# Patient Record
Sex: Female | Born: 1960
Health system: Southern US, Community
[De-identification: ages and names within clinical notes are randomized; demographics above are authoritative.]

## PROBLEM LIST (undated history)

## (undated) DIAGNOSIS — E119 Type 2 diabetes mellitus without complications: Secondary | ICD-10-CM

## (undated) HISTORY — DX: Type 2 diabetes mellitus without complications: E11.9

---

## 1988-06-23 HISTORY — PX: TUBAL LIGATION: SHX77

## 2009-06-23 HISTORY — PX: BUNIONECTOMY: SHX129

## 2013-06-21 ENCOUNTER — Emergency Department (HOSPITAL_COMMUNITY)
Admission: EM | Admit: 2013-06-21 | Discharge: 2013-06-21 | Disposition: A | Discharge status: Home / Self Care | Payer: BC Managed Care – PPO | Source: Home / Self Care

## 2013-06-21 ENCOUNTER — Encounter (HOSPITAL_COMMUNITY): Payer: Self-pay | Admitting: Emergency Medicine

## 2013-06-21 DIAGNOSIS — H6981 Other specified disorders of Eustachian tube, right ear: Secondary | ICD-10-CM

## 2013-06-21 DIAGNOSIS — H6692 Otitis media, unspecified, left ear: Secondary | ICD-10-CM

## 2013-06-21 DIAGNOSIS — H6980 Other specified disorders of Eustachian tube, unspecified ear: Secondary | ICD-10-CM

## 2013-06-21 DIAGNOSIS — H6690 Otitis media, unspecified, unspecified ear: Secondary | ICD-10-CM

## 2013-06-21 DIAGNOSIS — J019 Acute sinusitis, unspecified: Secondary | ICD-10-CM

## 2013-06-21 MED ORDER — AMOXICILLIN 500 MG PO CAPS: 1000.0000 mg | ORAL_CAPSULE | Freq: Two times a day (BID) | ORAL | Status: DC

## 2013-06-21 NOTE — ED Provider Notes (Signed)
CSN: [id number]     Arrival date & time 06/21/13  0901 History   First MD Initiated Contact with Patient 06/21/13 1102     Chief Complaint  Patient presents with  . Ear Fullness   (Consider location/radiation/quality/duration/timing/severity/associated sxs/prior Treatment) HPI Comments: 52 year old female presents with a history of recent "cold" symptoms for about 3 days. She did have PND and a scratchy throat but these symptoms are improving. She is now complaining of bilateral itchiness of the ears and left ear fullness and stuffiness. Is also complaining of cream axillary fullness. She is not taking medications for these symptoms.  Patient is a 52 y.o. female presenting with plugged ear sensation.  Ear Fullness    History reviewed. No pertinent past medical history. Past Surgical History  Procedure Laterality Date  . Bunionectomy  2011  . Tubal ligation  1990   No family history on file. History  Substance Use Topics  . Smoking status: Current Every Day Smoker  . Smokeless tobacco: Not on file  . Alcohol Use: Yes   OB History   Grav Para Term Preterm Abortions TAB SAB Ect Mult Living                 Review of Systems  Constitutional: Negative for fever, chills, activity change, appetite change and fatigue.  HENT: Positive for congestion and sinus pressure. Negative for facial swelling, postnasal drip and rhinorrhea.   Eyes: Negative.   Respiratory: Negative.   Cardiovascular: Negative.   Musculoskeletal: Negative for neck pain and neck stiffness.  Skin: Negative for pallor and rash.  Neurological: Negative.     Allergies  Review of patient's allergies indicates no known allergies.  Home Medications   Current Outpatient Rx  Name  Route  Sig  Dispense  Refill  . amoxicillin (AMOXIL) 500 MG capsule   Oral   Take 2 capsules (1,000 mg total) by mouth 2 (two) times daily.   40 capsule   0    BP 129/85  Pulse 70  Temp(Src) 98.7 F (37.1 C) (Oral)  Resp 16   SpO2 100% Physical Exam  Nursing note and vitals reviewed. Constitutional: She is oriented to person, place, and time. She appears well-developed and well-nourished. No distress.  HENT:  Right TM is retracted. Left TM is erythematous with partial loss of landmarks. Oropharynx with minor erythema but no exudates.  Eyes: Conjunctivae and EOM are normal.  Neck: Normal range of motion. Neck supple.  Cardiovascular: Normal rate, regular rhythm and normal heart sounds.   Pulmonary/Chest: Effort normal and breath sounds normal. No respiratory distress. She has no wheezes. She has no rales.  Musculoskeletal: Normal range of motion. She exhibits no edema.  Lymphadenopathy:    She has no cervical adenopathy.  Neurological: She is alert and oriented to person, place, and time.  Skin: Skin is warm and dry. No rash noted.  Psychiatric: She has a normal mood and affect.    ED Course  Procedures (including critical care time) Labs Review Labs Reviewed - No data to display Imaging Review No results found.    MDM   1. Acute rhinosinusitis   2. Left otitis media   3. ETD (eustachian tube dysfunction), right     Amoxicillin 1 g twice a day for 10 days Phenylephrine 10 mg and guaifenesin 100 200 mg by mouth every 4 hours when necessary sinus congestion Saline nasal spray use frequently Drink plenty of fluids  Hayden Rasmussen, NP 06/21/13 1144

## 2013-06-21 NOTE — ED Notes (Signed)
Reports bilateral ear itchiness, left side of head and left ear fullness/stuffiness.  Onset Sunday night of symptoms

## 2013-06-21 NOTE — ED Provider Notes (Signed)
Medical screening examination/treatment/procedure(s) were performed by non-physician practitioner and as supervising physician I was immediately available for consultation/collaboration.  Leslee Home, M.D.  Reuben Likes, MD 06/21/13 [phone]

## 2015-08-22 DIAGNOSIS — E119 Type 2 diabetes mellitus without complications: Secondary | ICD-10-CM

## 2015-08-22 HISTORY — DX: Type 2 diabetes mellitus without complications: E11.9

## 2015-09-03 ENCOUNTER — Ambulatory Visit (INDEPENDENT_AMBULATORY_CARE_PROVIDER_SITE_OTHER): Payer: BLUE CROSS/BLUE SHIELD | Admitting: Emergency Medicine

## 2015-09-03 ENCOUNTER — Ambulatory Visit (INDEPENDENT_AMBULATORY_CARE_PROVIDER_SITE_OTHER): Payer: BLUE CROSS/BLUE SHIELD

## 2015-09-03 ENCOUNTER — Encounter (HOSPITAL_COMMUNITY): Payer: Self-pay | Admitting: Emergency Medicine

## 2015-09-03 ENCOUNTER — Emergency Department (HOSPITAL_COMMUNITY)
Admission: EM | Admit: 2015-09-03 | Discharge: 2015-09-03 | Disposition: A | Discharge status: Home / Self Care | Payer: BLUE CROSS/BLUE SHIELD | Attending: Emergency Medicine | Admitting: Emergency Medicine

## 2015-09-03 VITALS — BP 120/80 | HR 101 | Temp 97.5°F | Resp 18 | Ht 63.0 in | Wt 120.6 lb

## 2015-09-03 DIAGNOSIS — R634 Abnormal weight loss: Secondary | ICD-10-CM

## 2015-09-03 DIAGNOSIS — Z79899 Other long term (current) drug therapy: Secondary | ICD-10-CM | POA: Diagnosis not present

## 2015-09-03 DIAGNOSIS — E119 Type 2 diabetes mellitus without complications: Secondary | ICD-10-CM

## 2015-09-03 DIAGNOSIS — Z720 Tobacco use: Secondary | ICD-10-CM

## 2015-09-03 DIAGNOSIS — E063 Autoimmune thyroiditis: Secondary | ICD-10-CM

## 2015-09-03 DIAGNOSIS — R002 Palpitations: Secondary | ICD-10-CM

## 2015-09-03 DIAGNOSIS — R739 Hyperglycemia, unspecified: Secondary | ICD-10-CM | POA: Diagnosis present

## 2015-09-03 DIAGNOSIS — F17200 Nicotine dependence, unspecified, uncomplicated: Secondary | ICD-10-CM

## 2015-09-03 LAB — URINALYSIS, ROUTINE W REFLEX MICROSCOPIC
BILIRUBIN URINE: NEGATIVE
Glucose, UA: >1000 mg/dL — AB
Hgb urine dipstick: NEGATIVE
Ketones, ur: >80 mg/dL — AB
Leukocytes, UA: NEGATIVE
NITRITE: NEGATIVE
Protein, ur: 30 mg/dL — AB
Specific Gravity, Urine: 1.044 — ABNORMAL HIGH (ref 1.005–1.030)
pH: 5.5 (ref 5.0–8.0)

## 2015-09-03 LAB — CBG MONITORING, ED
GLUCOSE-CAPILLARY: 269 mg/dL — AB (ref 65–99)
Glucose-Capillary: 246 mg/dL — ABNORMAL HIGH (ref 65–99)

## 2015-09-03 LAB — I-STAT CHEM 8, ED
BUN: 4 mg/dL — AB (ref 6–20)
CHLORIDE: 104 mmol/L (ref 101–111)
Calcium, Ion: 1.12 mmol/L (ref 1.12–1.23)
Creatinine, Ser: 0.4 mg/dL — ABNORMAL LOW (ref 0.44–1.00)
Glucose, Bld: 255 mg/dL — ABNORMAL HIGH (ref 65–99)
HEMATOCRIT: 37 % (ref 36.0–46.0)
Hemoglobin: 12.6 g/dL (ref 12.0–15.0)
Potassium: 3.7 mmol/L (ref 3.5–5.1)
SODIUM: 138 mmol/L (ref 135–145)
TCO2: 18 mmol/L (ref 0–100)

## 2015-09-03 LAB — BASIC METABOLIC PANEL
ANION GAP: 14 (ref 5–15)
BUN: 6 mg/dL (ref 6–20)
CALCIUM: 8.6 mg/dL — AB (ref 8.9–10.3)
CO2: 15 mmol/L — AB (ref 22–32)
Chloride: 103 mmol/L (ref 101–111)
Creatinine, Ser: 0.61 mg/dL (ref 0.44–1.00)
Glucose, Bld: 290 mg/dL — ABNORMAL HIGH (ref 65–99)
Potassium: 3.2 mmol/L — ABNORMAL LOW (ref 3.5–5.1)
SODIUM: 132 mmol/L — AB (ref 135–145)

## 2015-09-03 LAB — URINE MICROSCOPIC-ADD ON

## 2015-09-03 LAB — CBC
HCT: 44.3 % (ref 36.0–46.0)
HEMOGLOBIN: 16 g/dL — AB (ref 12.0–15.0)
MCH: 31.7 pg (ref 26.0–34.0)
MCHC: 36.1 g/dL — ABNORMAL HIGH (ref 30.0–36.0)
MCV: 87.7 fL (ref 78.0–100.0)
Platelets: 212 10*3/uL (ref 150–400)
RBC: 5.05 MIL/uL (ref 3.87–5.11)
RDW: 13.1 % (ref 11.5–15.5)
WBC: 9 10*3/uL (ref 4.0–10.5)

## 2015-09-03 LAB — COMPLETE METABOLIC PANEL WITH GFR
ALT: 21 U/L (ref 6–29)
AST: 18 U/L (ref 10–35)
Albumin: 4.2 g/dL (ref 3.6–5.1)
Alkaline Phosphatase: 121 U/L (ref 33–130)
BILIRUBIN TOTAL: 0.5 mg/dL (ref 0.2–1.2)
BUN: 6 mg/dL — AB (ref 7–25)
CHLORIDE: 96 mmol/L — AB (ref 98–110)
CO2: 18 mmol/L — AB (ref 20–31)
CREATININE: 0.67 mg/dL (ref 0.50–1.05)
Calcium: 9.4 mg/dL (ref 8.6–10.4)
GFR, Est African American: >89 mL/min (ref >=60)
GFR, Est Non African American: >89 mL/min (ref >=60)
GLUCOSE: 330 mg/dL — AB (ref 65–99)
Potassium: 3.5 mmol/L (ref 3.5–5.3)
SODIUM: 134 mmol/L — AB (ref 135–146)
Total Protein: 6.4 g/dL (ref 6.1–8.1)

## 2015-09-03 LAB — POCT CBC
Granulocyte percent: 68.3 %G (ref 37–80)
HEMATOCRIT: 45.2 % (ref 37.7–47.9)
HEMOGLOBIN: 16.2 g/dL (ref 12.2–16.2)
Lymph, poc: 2.3 (ref 0.6–3.4)
MCH, POC: 32.2 pg — AB (ref 27–31.2)
MCHC: 35.9 g/dL — AB (ref 31.8–35.4)
MCV: 89.7 fL (ref 80–97)
MID (cbc): 0.5 (ref 0–0.9)
MPV: 9 fL (ref 0–99.8)
POC GRANULOCYTE: 6 (ref 2–6.9)
POC LYMPH PERCENT: 26.2 %L (ref 10–50)
POC MID %: 5.5 %M (ref 0–12)
Platelet Count, POC: 215 10*3/uL (ref 142–424)
RBC: 5.04 M/uL (ref 4.04–5.48)
RDW, POC: 13 %
WBC: 8.8 10*3/uL (ref 4.6–10.2)

## 2015-09-03 LAB — THYROID PANEL WITH TSH
Free Thyroxine Index: 1.5 (ref 1.4–3.8)
T3 Uptake: 34 % (ref 22–35)
T4, Total: 4.3 ug/dL — ABNORMAL LOW (ref 4.5–12.0)
TSH: 4.88 mIU/L — ABNORMAL HIGH

## 2015-09-03 LAB — GLUCOSE, POCT (MANUAL RESULT ENTRY): POC GLUCOSE: 305 mg/dL — AB (ref 70–99)

## 2015-09-03 LAB — POCT GLYCOSYLATED HEMOGLOBIN (HGB A1C)

## 2015-09-03 MED ORDER — METFORMIN HCL 500 MG PO TABS: 500.0000 mg | ORAL_TABLET | Freq: Two times a day (BID) | ORAL | Status: DC

## 2015-09-03 MED ORDER — SODIUM CHLORIDE 0.9 % IV BOLUS (SEPSIS)
1000.0000 mL | Freq: Once | INTRAVENOUS | Status: AC
  Administered 2015-09-03: 1000 mL via INTRAVENOUS

## 2015-09-03 MED ORDER — POTASSIUM CHLORIDE CRYS ER 20 MEQ PO TBCR
40.0000 meq | EXTENDED_RELEASE_TABLET | Freq: Once | ORAL | Status: AC
  Administered 2015-09-03: 40 meq via ORAL
  Filled 2015-09-03: qty 2

## 2015-09-03 MED ORDER — METFORMIN HCL 500 MG PO TABS
500.0000 mg | ORAL_TABLET | Freq: Once | ORAL | Status: AC
  Administered 2015-09-03: 500 mg via ORAL
  Filled 2015-09-03: qty 1

## 2015-09-03 NOTE — Progress Notes (Addendum)
Subjective:  This chart was scribed for Arlyss Queen MD,  by Tamsen Roers, at [facility].  This patient was seen in room  7 and the patient's care was started at 11:14 AM.    Patient ID: Felicia Gardner, female    DOB: 16-May-1961, 55 y.o.   MRN: [id number]  HPI HPI Comments: Felicia Gardner is a 55 y.o. female who presents to the [facility] complaining of shortness of breath, heart palpitations, fatigue and weight loss.   In January, patient decided she wanted to lose weight and started doing portion control and states that her weight came off very fast fast (from 142 to 120- which was concerning for her).  Patient then went on Chantix to quit smoking (1 mg in the morning to 1 at night) and remembers urinating in her sleep in bed afterwards.  She stopped taking the Chantix but still feels like she is under the effect of it. Patient smokes daily (1/2 per day).  She is not currently on any medications and drinks coffee (1-3 cups daily).    She is also complaining of a dry spot in the back of her throat and states that she has to drink close to 18 bottles of water a day to get rid of it.    Patient states that thyroid disease is very prevalent in her family (sister, mother, grandmother).   Patient has an appointment with Dr. Brigitte Pulse in April.    Patients last check up was 6 years ago.  She is not up to date with her screenings.    There are no active problems to display for this patient.  History reviewed. No pertinent past medical history. Past Surgical History  Procedure Laterality Date  . Bunionectomy  2011  . Tubal ligation  1990   No Known Allergies Prior to Admission medications   Not on File   Social History   Social History  . Marital Status: Married    Spouse Name: N/A  . Number of Children: N/A  . Years of Education: N/A   Occupational History  . Not on file.   Social History Main Topics  . Smoking status: Current Every  Day Smoker  . Smokeless tobacco: Not on file  . Alcohol Use: Yes  . Drug Use: No  . Sexual Activity: Not on file   Other Topics Concern  . Not on file   Social History Narrative       Review of Systems  Constitutional: Positive for fatigue and unexpected weight change.  Eyes: Negative for pain, redness and itching.  Respiratory: Positive for shortness of breath. Negative for choking.   Cardiovascular: Positive for palpitations.  Gastrointestinal: Negative for nausea and vomiting.  Musculoskeletal: Negative for neck pain and neck stiffness.  Skin: Negative for color change.  Neurological: Negative for seizures, syncope and speech difficulty.       Objective:   Physical Exam Filed Vitals:   09/03/15 1054  BP: 120/80  Pulse: 101  Temp: 97.5 F (36.4 C)  Resp: 18  Height: 5\' 3"  (1.6 m)  Weight: 120 lb 9.6 oz (54.704 kg)  SpO2: 98%    CONSTITUTIONAL: She is a thin female who does not appear in any distress HEAD: Normocephalic/atraumatic EYES: EOMI/PERRL ENMT: Mucous membranes moist NECK: thyroid is not enlarged.  SPINE/BACK:entire spine nontender CV: S1/S2 noted, no murmurs/rubs/gallops noted LUNGS: Lungs are clear to auscultation bilaterally, no apparent distress ABDOMEN: soft, nontender, no rebound or guarding, bowel  sounds noted throughout abdomen GU:no cva tenderness NEURO: Pt is awake/alert/appropriate, moves all extremitiesx4.  No facial droop.   EXTREMITIES: pulses normal/equal, full ROM SKIN: warm, color normal PSYCH: no abnormalities of mood noted, alert and oriented to situation Breast exam: normal.  EKG NSR Results for orders placed or performed in visit on 09/03/15  POCT CBC  Result Value Ref Range   WBC 8.8 4.6 - 10.2 K/uL   Lymph, poc 2.3 0.6 - 3.4   POC LYMPH PERCENT 26.2 10 - 50 %L   MID (cbc) 0.5 0 - 0.9   POC MID % 5.5 0 - 12 %M   POC Granulocyte 6.0 2 - 6.9   Granulocyte percent 68.3 37 - 80 %G   RBC 5.04 4.04 - 5.48 M/uL   Hemoglobin  16.2 12.2 - 16.2 g/dL   HCT, POC 45.2 37.7 - 47.9 %   MCV 89.7 80 - 97 fL   MCH, POC 32.2 (A) 27 - 31.2 pg   MCHC 35.9 (A) 31.8 - 35.4 g/dL   RDW, POC 13.0 %   Platelet Count, POC 215 142 - 424 K/uL   MPV 9.0 0 - 99.8 fL  POCT glucose (manual entry)  Result Value Ref Range   POC Glucose 305 (A) 70 - 99 mg/dl   Dg Chest 2 View  09/03/2015  CLINICAL DATA:  Shortness of breath. Heart palpitations. Fatigue and weight loss. EXAM: CHEST  2 VIEW COMPARISON:  None. FINDINGS: Normal cardiac silhouette and mediastinal contours. No focal parenchymal opacities. No pleural effusion or pneumothorax. No evidence of edema. No acute osseus abnormalities. IMPRESSION: No acute cardiopulmonary disease. Electronically Signed   By: Sandi Mariscal M.D.   On: 09/03/2015 12:18      Assessment & Plan:  Patient presents with a 20 pound weight loss since the first of the year. It appears patient is a new onset diabetic with a glucose of 305. IV will be started and she will be transported to the emergency room for further evaluation IV fluids and evaluation of renal function. I did place a referral to endocrinology. I personally performed the services described in this documentation, which was scribed in my presence. The recorded information has been reviewed and is accurate. Darlyne Russian, MD On 09/05/2015 I received results of her C-peptide and thyroid studies. It appears she has an autoimmune thyroiditis with positive antibodies as well as very limited insulin production. This is suspicious for an autoimmune problem involving the pancreas and thyroid. Urgent referral made to endocrinology. I called the patient and she is aware.

## 2015-09-03 NOTE — ED Provider Notes (Signed)
CSN: [id number]     Arrival date & time 09/03/15  1311 History   First MD Initiated Contact with Patient 09/03/15 1816     Chief Complaint  Patient presents with  . Hyperglycemia     (Consider location/radiation/quality/duration/timing/severity/associated sxs/prior Treatment) HPI Natonya Kosmalski is a 55 y.o. female with no major medical problems, presents to emergency department complaining of polyuria, polydipsia, elevated blood sugar at urgent care today. Patient states she went to urgent care because she has had generalized weakness, she states she slept all weekend and had no energy to do anything. She reports polyuria and polydipsia for several months. She reports that she recently to Chantix, states stopped it 2 weeks ago, she reports that she looked up side effects of Chantix and states that it can cause diabetes. Patient however states that she does believe that her symptoms started prior to taking Chantix. She denies any other medical problems. At urgent care blood sugar was in 300s, her A1c is greater than 14. She complained of some palpitations earlier, recent weight loss, generalized malaise. She was sent here for further evaluation. Patient denies any pain at this time. She denies any fever or chills. No recent illnesses. No complaints other than fatigue and dry mouth at this time. Patient has not seen a primary care doctor in 6 years.  History reviewed. No pertinent past medical history. Past Surgical History  Procedure Laterality Date  . Bunionectomy  2011  . Tubal ligation  1990   Family History  Problem Relation Age of Onset  . Heart disease Mother    Social History  Substance Use Topics  . Smoking status: Current Every Day Smoker  . Smokeless tobacco: None  . Alcohol Use: Yes   OB History    No data available     Review of Systems    Allergies  Review of patient's allergies indicates no known allergies.  Home Medications   Prior to Admission medications    Medication Sig Start Date End Date Taking? Authorizing Provider  ibuprofen (ADVIL,MOTRIN) 200 MG tablet Take 400 mg by mouth every 6 (six) hours as needed for headache.   Yes Historical Provider, MD  varenicline (CHANTIX PAK) 0.5 MG X 11 & 1 MG X 42 tablet Take by mouth. Take one 0.5 mg tablet by mouth once daily for 3 days, then increase to one 0.5 mg tablet twice daily for 4 days, then increase to one 1 mg tablet twice daily.    Historical Provider, MD   BP 119/78 mmHg  Pulse 86  Temp(Src) 97.7 F (36.5 C) (Oral)  Resp 15  SpO2 100% Physical Exam  ED Course  Procedures (including critical care time) Labs Review Labs Reviewed  BASIC METABOLIC PANEL - Abnormal; Notable for the following:    Sodium 132 (*)    Potassium 3.2 (*)    CO2 15 (*)    Glucose, Bld 290 (*)    Calcium 8.6 (*)    All other components within normal limits  CBC - Abnormal; Notable for the following:    Hemoglobin 16.0 (*)    MCHC 36.1 (*)    All other components within normal limits  URINALYSIS, ROUTINE W REFLEX MICROSCOPIC (NOT AT [facility]) - Abnormal; Notable for the following:    Specific Gravity, Urine 1.044 (*)    Glucose, UA >1000 (*)    Ketones, ur >80 (*)    Protein, ur 30 (*)    All other components within normal limits  URINE MICROSCOPIC-ADD ON -  Abnormal; Notable for the following:    Squamous Epithelial / LPF 0-5 (*)    Bacteria, UA RARE (*)    Casts HYALINE CASTS (*)    All other components within normal limits  CBG MONITORING, ED - Abnormal; Notable for the following:    Glucose-Capillary 269 (*)    All other components within normal limits    Imaging Review Dg Chest 2 View  09/03/2015  CLINICAL DATA:  Shortness of breath. Heart palpitations. Fatigue and weight loss. EXAM: CHEST  2 VIEW COMPARISON:  None. FINDINGS: Normal cardiac silhouette and mediastinal contours. No focal parenchymal opacities. No pleural effusion or pneumothorax. No evidence of edema. No acute osseus abnormalities.  IMPRESSION: No acute cardiopulmonary disease. Electronically Signed   By: Sandi Mariscal M.D.   On: 09/03/2015 12:18   I have personally reviewed and evaluated these images and lab results as part of my medical decision-making.   EKG Interpretation None      MDM   Final diagnoses:  Type 2 diabetes mellitus without complication, without long-term current use of insulin (HCC)   Pt coming from UC with glucose over 300 and H1C >14. Pt has normal VS. Received 500cc of NS. Will start iv fluids. Labs ordered.    7:15 PM Pt's labs showed bicarb of 15, glucose 290, sodium 132, normal corrected, potassium 3.2. Replenished with 3mEq of PO potassium. Anion gap is 14. UA does show >80 ketones. Will hydrate and recheck bicarb   10:49 PM Pt received 2L of NS. She feels a lot better. Chem 8 rechecked showing improved NA of 138, improved K of 3.7, bicarb 18. At this time, VS normal, pt is non toxic. Will start on metformin. Diet changes discussed. Pt will follow up with PCP as soon as able.   Filed Vitals:   09/03/15 1324 09/03/15 1809 09/03/15 2016 09/03/15 2224  BP: 123/96 119/78 128/93 120/77  Pulse: 75 86 77 79  Temp: 97.7 F (36.5 C)     TempSrc: Oral     Resp: 14 15 20 20   SpO2: 100% 100% 100% 100%     Jeannett Senior, PA-C 09/04/15 0110  Davonna Belling, MD 09/06/15 [phone]

## 2015-09-03 NOTE — ED Notes (Signed)
PT DISCHARGED. INSTRUCTIONS AND PRESCRIPTION GIVEN. AAOX3. PT IN NO APPARENT DISTRESS OR PAIN. THE OPPORTUNITY TO ASK QUESTIONS WAS PROVIDED. 

## 2015-09-03 NOTE — Patient Instructions (Signed)
     IF you received an x-ray today, you will receive an invoice from [facility]. Please contact [facility] at [phone] with questions or concerns regarding your invoice.   IF you received labwork today, you will receive an invoice from [facility]. Please contact [facility] at [phone] with questions or concerns regarding your invoice.   Our billing staff will not be able to assist you with questions regarding bills from these companies.  You will be contacted with the lab results as soon as they are available. The fastest way to get your results is to activate your My Chart account. Instructions are located on the last page of this paperwork. If you have not heard from us regarding the results in 2 weeks, please contact this office.      

## 2015-09-03 NOTE — ED Notes (Addendum)
Per EMS, from PCP. Started chantix to stop smoking, began dropping weight quickly while trying to lose weight. Was very thirsty during this, was unsure if that was a side effect of coming off smoking. Went to PCP and found to be hyperglycemic in the 300s. No hx of diabetes, no family hx of diabetes. No other complaints, positive orthostatic changes.  500 ml NS given IV in route. CBG improved in triage

## 2015-09-03 NOTE — Discharge Instructions (Signed)
Start taking metformin as prescribed daily. Drink plenty of fluids. Follow up with primary care doctor as soon as able for recheck and further management.    Diabetes Mellitus and Food It is important for you to manage your blood sugar (glucose) level. Your blood glucose level can be greatly affected by what you eat. Eating healthier foods in the appropriate amounts throughout the day at about the same time each day will help you control your blood glucose level. It can also help slow or prevent worsening of your diabetes mellitus. Healthy eating may even help you improve the level of your blood pressure and reach or maintain a healthy weight.  General recommendations for healthful eating and cooking habits include:  Eating meals and snacks regularly. Avoid going long periods of time without eating to lose weight.  Eating a diet that consists mainly of plant-based foods, such as fruits, vegetables, nuts, legumes, and whole grains.  Using low-heat cooking methods, such as baking, instead of high-heat cooking methods, such as deep frying. Work with your dietitian to make sure you understand how to use the Nutrition Facts information on food labels. HOW CAN FOOD AFFECT ME? Carbohydrates Carbohydrates affect your blood glucose level more than any other type of food. Your dietitian will help you determine how many carbohydrates to eat at each meal and teach you how to count carbohydrates. Counting carbohydrates is important to keep your blood glucose at a healthy level, especially if you are using insulin or taking certain medicines for diabetes mellitus. Alcohol Alcohol can cause sudden decreases in blood glucose (hypoglycemia), especially if you use insulin or take certain medicines for diabetes mellitus. Hypoglycemia can be a life-threatening condition. Symptoms of hypoglycemia (sleepiness, dizziness, and disorientation) are similar to symptoms of having too much alcohol.  If your health care  provider has given you approval to drink alcohol, do so in moderation and use the following guidelines:  Women should not have more than one drink per day, and men should not have more than two drinks per day. One drink is equal to:  12 oz of beer.  5 oz of wine.  1 oz of hard liquor.  Do not drink on an empty stomach.  Keep yourself hydrated. Have water, diet soda, or unsweetened iced tea.  Regular soda, juice, and other mixers might contain a lot of carbohydrates and should be counted. WHAT FOODS ARE NOT RECOMMENDED? As you make food choices, it is important to remember that all foods are not the same. Some foods have fewer nutrients per serving than other foods, even though they might have the same number of calories or carbohydrates. It is difficult to get your body what it needs when you eat foods with fewer nutrients. Examples of foods that you should avoid that are high in calories and carbohydrates but low in nutrients include:  Trans fats (most processed foods list trans fats on the Nutrition Facts label).  Regular soda.  Juice.  Candy.  Sweets, such as cake, pie, doughnuts, and cookies.  Fried foods. WHAT FOODS CAN I EAT? Eat nutrient-rich foods, which will nourish your body and keep you healthy. The food you should eat also will depend on several factors, including:  The calories you need.  The medicines you take.  Your weight.  Your blood glucose level.  Your blood pressure level.  Your cholesterol level. You should eat a variety of foods, including:  Protein.  Lean cuts of meat.  Proteins low in saturated fats, such as  fish, egg whites, and beans. Avoid processed meats.  Fruits and vegetables.  Fruits and vegetables that may help control blood glucose levels, such as apples, mangoes, and yams.  Dairy products.  Choose fat-free or low-fat dairy products, such as milk, yogurt, and cheese.  Grains, bread, pasta, and rice.  Choose whole grain  products, such as multigrain bread, whole oats, and brown rice. These foods may help control blood pressure.  Fats.  Foods containing healthful fats, such as nuts, avocado, olive oil, canola oil, and fish. DOES EVERYONE WITH DIABETES MELLITUS HAVE THE SAME MEAL PLAN? Because every person with diabetes mellitus is different, there is not one meal plan that works for everyone. It is very important that you meet with a dietitian who will help you create a meal plan that is just right for you.   This information is not intended to replace advice given to you by your health care provider. Make sure you discuss any questions you have with your health care provider.   Document Released: 03/06/2005 Document Revised: 06/30/2014 Document Reviewed: 05/06/2013 Elsevier Interactive Patient Education 2016 [employer]    Type 2 Diabetes Mellitus, Adult Type 2 diabetes mellitus, often simply referred to as type 2 diabetes, is a long-lasting (chronic) disease. In type 2 diabetes, the pancreas does not make enough insulin (a hormone), the cells are less responsive to the insulin that is made (insulin resistance), or both. Normally, insulin moves sugars from food into the tissue cells. The tissue cells use the sugars for energy. The lack of insulin or the lack of normal response to insulin causes excess sugars to build up in the blood instead of going into the tissue cells. As a result, high blood sugar (hyperglycemia) develops. The effect of high sugar (glucose) levels can cause many complications. Type 2 diabetes was also previously called adult-onset diabetes, but it can occur at any age.  RISK FACTORS  A person is predisposed to developing type 2 diabetes if someone in the family has the disease and also has one or more of the following primary risk factors:  Weight gain, or being overweight or obese.  An inactive lifestyle.  A history of consistently eating high-calorie foods. Maintaining a normal  weight and regular physical activity can reduce the chance of developing type 2 diabetes. SYMPTOMS  A person with type 2 diabetes may not show symptoms initially. The symptoms of type 2 diabetes appear slowly. The symptoms include:  Increased thirst (polydipsia).  Increased urination (polyuria).  Increased urination during the night (nocturia).  Sudden or unexplained weight changes.  Frequent, recurring infections.  Tiredness (fatigue).  Weakness.  Vision changes, such as blurred vision.  Fruity smell to your breath.  Abdominal pain.  Nausea or vomiting.  Cuts or bruises which are slow to heal.  Tingling or numbness in the hands or feet.  An open skin wound (ulcer). DIAGNOSIS Type 2 diabetes is frequently not diagnosed until complications of diabetes are present. Type 2 diabetes is diagnosed when symptoms or complications are present and when blood glucose levels are increased. Your blood glucose level may be checked by one or more of the following blood tests:  A fasting blood glucose test. You will not be allowed to eat for at least 8 hours before a blood sample is taken.  A random blood glucose test. Your blood glucose is checked at any time of the day regardless of when you ate.  A hemoglobin A1c blood glucose test. A hemoglobin A1c test provides  information about blood glucose control over the previous 3 months.  An oral glucose tolerance test (OGTT). Your blood glucose is measured after you have not eaten (fasted) for 2 hours and then after you drink a glucose-containing beverage. TREATMENT   You may need to take insulin or diabetes medicine daily to keep blood glucose levels in the desired range.  If you use insulin, you may need to adjust the dosage depending on the carbohydrates that you eat with each meal or snack.  Lifestyle changes are recommended as part of your treatment. These may include:  Following an individualized diet plan developed by a  nutritionist or dietitian.  Exercising daily. Your health care providers will set individualized treatment goals for you based on your age, your medicines, how long you have had diabetes, and any other medical conditions you have. Generally, the goal of treatment is to maintain the following blood glucose levels:  Before meals (preprandial): 80-130 mg/dL.  After meals (postprandial): below 180 mg/dL.  A1c: less than 6.5-7%. HOME CARE INSTRUCTIONS   Have your hemoglobin A1c level checked twice a year.  Perform daily blood glucose monitoring as directed by your health care provider.  Monitor urine ketones when you are ill and as directed by your health care provider.  Take your diabetes medicine or insulin as directed by your health care provider to maintain your blood glucose levels in the desired range.  Never run out of diabetes medicine or insulin. It is needed every day.  If you are using insulin, you may need to adjust the amount of insulin given based on your intake of carbohydrates. Carbohydrates can raise blood glucose levels but need to be included in your diet. Carbohydrates provide vitamins, minerals, and fiber which are an essential part of a healthy diet. Carbohydrates are found in fruits, vegetables, whole grains, dairy products, legumes, and foods containing added sugars.  Eat healthy foods. You should make an appointment to see a registered dietitian to help you create an eating plan that is right for you.  Lose weight if you are overweight.  Carry a medical alert card or wear your medical alert jewelry.  Carry a 15-gram carbohydrate snack with you at all times to treat low blood glucose (hypoglycemia). Some examples of 15-gram carbohydrate snacks include:  Glucose tablets, 3 or 4.  Glucose gel, 15-gram tube.  Raisins, 2 tablespoons (24 grams).  Jelly beans, 6.  Animal crackers, 8.  Regular pop, 4 ounces (120 mL).  Gummy treats, 9.  Recognize hypoglycemia.  Hypoglycemia occurs with blood glucose levels of 70 mg/dL and below. The risk for hypoglycemia increases when fasting or skipping meals, during or after intense exercise, and during sleep. Hypoglycemia symptoms can include:  Tremors or shakes.  Decreased ability to concentrate.  Sweating.  Increased heart rate.  Headache.  Dry mouth.  Hunger.  Irritability.  Anxiety.  Restless sleep.  Altered speech or coordination.  Confusion.  Treat hypoglycemia promptly. If you are alert and able to safely swallow, follow the 15:15 rule:  Take 15-20 grams of rapid-acting glucose or carbohydrate. Rapid-acting options include glucose gel, glucose tablets, or 4 ounces (120 mL) of fruit juice, regular soda, or low-fat milk.  Check your blood glucose level 15 minutes after taking the glucose.  Take 15-20 grams more of glucose if the repeat blood glucose level is still 70 mg/dL or below.  Eat a meal or snack within 1 hour once blood glucose levels return to normal.  Be alert to feeling very  thirsty and urinating more frequently than usual, which are early signs of hyperglycemia. An early awareness of hyperglycemia allows for prompt treatment. Treat hyperglycemia as directed by your health care provider.  Engage in at least 150 minutes of moderate-intensity physical activity a week, spread over at least 3 days of the week or as directed by your health care provider. In addition, you should engage in resistance exercise at least 2 times a week or as directed by your health care provider. Try to spend no more than 90 minutes at one time inactive.  Adjust your medicine and food intake as needed if you start a new exercise or sport.  Follow your sick-day plan anytime you are unable to eat or drink as usual.  Do not use any tobacco products including cigarettes, chewing tobacco, or electronic cigarettes. If you need help quitting, ask your health care provider.  Limit alcohol intake to no more  than 1 drink per day for nonpregnant women and 2 drinks per day for men. You should drink alcohol only when you are also eating food. Talk with your health care provider whether alcohol is safe for you. Tell your health care provider if you drink alcohol several times a week.  Keep all follow-up visits as directed by your health care provider. This is important.  Schedule an eye exam soon after the diagnosis of type 2 diabetes and then annually.  Perform daily skin and foot care. Examine your skin and feet daily for cuts, bruises, redness, nail problems, bleeding, blisters, or sores. A foot exam by a health care provider should be done annually.  Brush your teeth and gums at least twice a day and floss at least once a day. Follow up with your dentist regularly.  Share your diabetes management plan with your workplace or school.  Keep your immunizations up to date. It is recommended that you receive a flu (influenza) vaccine every year. It is also recommended that you receive a pneumonia (pneumococcal) vaccine. If you are 35 years of age or older and have never received a pneumonia vaccine, this vaccine may be given as a series of two separate shots. Ask your health care provider which additional vaccines may be recommended.  Learn to manage stress.  Obtain ongoing diabetes education and support as needed.  Participate in or seek rehabilitation as needed to maintain or improve independence and quality of life. Request a physical or occupational therapy referral if you are having foot or hand numbness, or difficulties with grooming, dressing, eating, or physical activity. SEEK MEDICAL CARE IF:   You are unable to eat food or drink fluids for more than 6 hours.  You have nausea and vomiting for more than 6 hours.  Your blood glucose level is over 240 mg/dL.  There is a change in mental status.  You develop an additional serious illness.  You have diarrhea for more than 6 hours.  You  have been sick or have had a fever for a couple of days and are not getting better.  You have pain during any physical activity.  SEEK IMMEDIATE MEDICAL CARE IF:  You have difficulty breathing.  You have moderate to large ketone levels.   This information is not intended to replace advice given to you by your health care provider. Make sure you discuss any questions you have with your health care provider.   Document Released: 06/09/2005 Document Revised: 02/28/2015 Document Reviewed: 01/06/2012 Elsevier Interactive Patient Education [employer].

## 2015-09-03 NOTE — ED Notes (Signed)
Patient instructed to alert staff if she needs to leave to have IV removed.

## 2015-09-03 NOTE — Progress Notes (Signed)
Patient listed as not having insurance or a pcp.  EDCM spoke to patient at bedside.  Patient confirms she has [employer].  Patient reports she has recently started going to the Urgent Care and has an appointment to see Dr. Brigitte Pulse 04/13 an 1015am.  No further EDCM needs at this time.

## 2015-09-04 ENCOUNTER — Ambulatory Visit (INDEPENDENT_AMBULATORY_CARE_PROVIDER_SITE_OTHER): Payer: BLUE CROSS/BLUE SHIELD | Admitting: Family Medicine

## 2015-09-04 ENCOUNTER — Other Ambulatory Visit: Payer: Self-pay | Admitting: Emergency Medicine

## 2015-09-04 VITALS — BP 113/80 | HR 91 | Temp 98.0°F | Resp 16 | Ht 63.0 in | Wt 122.6 lb

## 2015-09-04 DIAGNOSIS — IMO0002 Reserved for concepts with insufficient information to code with codable children: Secondary | ICD-10-CM

## 2015-09-04 DIAGNOSIS — E1165 Type 2 diabetes mellitus with hyperglycemia: Secondary | ICD-10-CM

## 2015-09-04 DIAGNOSIS — Z794 Long term (current) use of insulin: Secondary | ICD-10-CM | POA: Diagnosis not present

## 2015-09-04 DIAGNOSIS — R739 Hyperglycemia, unspecified: Secondary | ICD-10-CM

## 2015-09-04 DIAGNOSIS — E069 Thyroiditis, unspecified: Secondary | ICD-10-CM | POA: Diagnosis not present

## 2015-09-04 DIAGNOSIS — E119 Type 2 diabetes mellitus without complications: Secondary | ICD-10-CM | POA: Diagnosis not present

## 2015-09-04 LAB — THYROID ANTIBODIES: THYROGLOBULIN AB: 2 [IU]/mL — AB (ref <2)

## 2015-09-04 LAB — GLUCOSE, POCT (MANUAL RESULT ENTRY): POC Glucose: 294 mg/dl — AB (ref 70–99)

## 2015-09-04 LAB — C-PEPTIDE: C PEPTIDE: 0.49 ng/mL — AB (ref 0.80–3.85)

## 2015-09-04 MED ORDER — BLOOD GLUCOSE MONITOR KIT: PACK | Status: DC

## 2015-09-04 MED ORDER — INSULIN GLARGINE 100 UNITS/ML SOLOSTAR PEN: 10.0000 [IU] | PEN_INJECTOR | Freq: Once | SUBCUTANEOUS | Status: DC

## 2015-09-04 NOTE — Patient Instructions (Addendum)
IF you received an x-ray today, you will receive an invoice from [facility]. Please contact [facility] at [phone] with questions or concerns regarding your invoice.   IF you received labwork today, you will receive an invoice from [employer]. Please contact [facility] at [phone] with questions or concerns regarding your invoice.   Our billing staff will not be able to assist you with questions regarding bills from these companies.  You will be contacted with the lab results as soon as they are available. The fastest way to get your results is to activate your My Chart account. Instructions are located on the last page of this paperwork. If you have not heard from [place] regarding the results in 2 weeks, please contact this office.   Blood Glucose Monitoring, Adult Monitoring your blood glucose (also know as blood sugar) helps you to manage your diabetes. It also helps you and your health care provider monitor your diabetes and determine how well your treatment plan is working. WHY SHOULD YOU MONITOR YOUR BLOOD GLUCOSE?  It can help you understand how food, exercise, and medicine affect your blood glucose.  It allows you to know what your blood glucose is at any given moment. You can quickly tell if you are having low blood glucose (hypoglycemia) or high blood glucose (hyperglycemia).  It can help you and your health care provider know how to adjust your medicines.  It can help you understand how to manage an illness or adjust medicine for exercise. WHEN SHOULD YOU TEST? Your health care provider will help you decide how often you should check your blood glucose. This may depend on the type of diabetes you have, your diabetes control, or the types of medicines you are taking. Be sure to write down all of your blood glucose readings so that this information can be reviewed with your health care provider. See below for examples of testing times  that your health care provider may suggest. Type 1 Diabetes  Test at least 2 times per day if your diabetes is well controlled, if you are using an insulin pump, or if you perform multiple daily injections.  If your diabetes is not well controlled or if you are sick, you may need to test more often.  It is a good idea to also test:  Before every insulin injection.  Before and after exercise.  Between meals and 2 hours after a meal.  Occasionally between 2:00 a.m. and 3:00 a.m. Type 2 Diabetes  If you are taking insulin, test at least 2 times per day. However, it is best to test before every insulin injection.  If you take medicines by mouth (orally), test 2 times a day.  If you are on a controlled diet, test once a day.  If your diabetes is not well controlled or if you are sick, you may need to monitor more often. HOW TO MONITOR YOUR BLOOD GLUCOSE Supplies Needed  Blood glucose meter.  Test strips for your meter. Each meter has its own strips. You must use the strips that go with your own meter.  A pricking needle (lancet).  A device that holds the lancet (lancing device).  A journal or log book to write down your results. Procedure  Wash your hands with soap and water. Alcohol is not preferred.  Prick the side of your finger (not the tip) with the lancet.  Gently milk the finger until a small drop of blood appears.  Follow the instructions that come  with your meter for inserting the test strip, applying blood to the strip, and using your blood glucose meter. Other Areas to Get Blood for Testing Some meters allow you to use other areas of your body (other than your finger) to test your blood. These areas are called alternative sites. The most common alternative sites are:  The forearm.  The thigh.  The back area of the lower leg.  The palm of the hand. The blood flow in these areas is slower. Therefore, the blood glucose values you get may be delayed, and the  numbers are different from what you would get from your fingers. Do not use alternative sites if you think you are having hypoglycemia. Your reading will not be accurate. Always use a finger if you are having hypoglycemia. Also, if you cannot feel your lows (hypoglycemia unawareness), always use your fingers for your blood glucose checks. ADDITIONAL TIPS FOR GLUCOSE MONITORING  Do not reuse lancets.  Always carry your supplies with you.  All blood glucose meters have a 24-hour "hotline" number to call if you have questions or need help.  Adjust (calibrate) your blood glucose meter with a control solution after finishing a few boxes of strips. BLOOD GLUCOSE RECORD KEEPING It is a good idea to keep a daily record or log of your blood glucose readings. Most glucose meters, if not all, keep your glucose records stored in the meter. Some meters come with the ability to download your records to your home computer. Keeping a record of your blood glucose readings is especially helpful if you are wanting to look for patterns. Make notes to go along with the blood glucose readings because you might forget what happened at that exact time. Keeping good records helps you and your health care provider to work together to achieve good diabetes management.    This information is not intended to replace advice given to you by your health care provider. Make sure you discuss any questions you have with your health care provider.   Document Released: 06/12/2003 Document Revised: 06/30/2014 Document Reviewed: 11/01/2012 Elsevier Interactive Patient Education 2016 [place].  Diabetes and Standards of Medical Care Diabetes is complicated. You may find that your diabetes team includes a dietitian, nurse, diabetes educator, eye doctor, and more. To help everyone know what is going on and to help you get the care you deserve, the following schedule of care was developed to help keep you on track. Below are the  tests, exams, vaccines, medicines, education, and plans you will need. HbA1c test This test shows how well you have controlled your glucose over the past 2-3 months. It is used to see if your diabetes management plan needs to be adjusted.   It is performed at least 2 times a year if you are meeting treatment goals.  It is performed 4 times a year if therapy has changed or if you are not meeting treatment goals. Blood pressure test  This test is performed at every routine medical visit. The goal is less than 140/90 mm Hg for most people, but 130/80 mm Hg in some cases. Ask your health care provider about your goal. Dental exam  Follow up with the dentist regularly. Eye exam  If you are diagnosed with type 1 diabetes as a child, get an exam upon reaching the age of 48 years or older and having had diabetes for 3-5 years. Yearly eye exams are recommended after that initial eye exam.  If you are diagnosed with  type 1 diabetes as an adult, get an exam within 5 years of diagnosis and then yearly.  If you are diagnosed with type 2 diabetes, get an exam as soon as possible after the diagnosis and then yearly. Foot care exam  Visual foot exams are performed at every routine medical visit. The exams check for cuts, injuries, or other problems with the feet.  You should have a complete foot exam performed every year. This exam includes an inspection of the structure and skin of your feet, a check of the pulses in your feet, and a check of the sensation in your feet.  Type 1 diabetes: The first exam is performed 5 years after diagnosis.  Type 2 diabetes: The first exam is performed at the time of diagnosis.  Check your feet nightly for cuts, injuries, or other problems with your feet. Tell your health care provider if anything is not healing. Kidney function test (urine microalbumin)  This test is performed once a year.  Type 1 diabetes: The first test is performed 5 years after  diagnosis.  Type 2 diabetes: The first test is performed at the time of diagnosis.  A serum creatinine and estimated glomerular filtration rate (eGFR) test is done once a year to assess the level of chronic kidney disease (CKD), if present. Lipid profile (cholesterol, HDL, LDL, triglycerides)  Performed every 5 years for most people.  The goal for LDL is less than 100 mg/dL. If you are at high risk, the goal is less than 70 mg/dL.  The goal for HDL is 40 mg/dL-50 mg/dL for men and 50 mg/dL-60 mg/dL for women. An HDL cholesterol of 60 mg/dL or higher gives some protection against heart disease.  The goal for triglycerides is less than 150 mg/dL. Immunizations  The flu (influenza) vaccine is recommended yearly for every person 69 months of age or older who has diabetes.  The pneumonia (pneumococcal) vaccine is recommended for every person 64 years of age or older who has diabetes. Adults 85 years of age or older may receive the pneumonia vaccine as a series of two separate shots.  The hepatitis B vaccine is recommended for adults shortly after they have been diagnosed with diabetes.  The Tdap (tetanus, diphtheria, and pertussis) vaccine should be given:  According to normal childhood vaccination schedules, for children.  Every 10 years, for adults who have diabetes. Diabetes self-management education  Education is recommended at diagnosis and ongoing as needed. Treatment plan  Your treatment plan is reviewed at every medical visit.   This information is not intended to replace advice given to you by your health care provider. Make sure you discuss any questions you have with your health care provider.   Document Released: 04/06/2009 Document Revised: 06/30/2014 Document Reviewed: 11/09/2012 Elsevier Interactive Patient Education 2016 [employer].  Diabetes Mellitus and Food It is important for you to manage your blood sugar (glucose) level. Your blood glucose level can be  greatly affected by what you eat. Eating healthier foods in the appropriate amounts throughout the day at about the same time each day will help you control your blood glucose level. It can also help slow or prevent worsening of your diabetes mellitus. Healthy eating may even help you improve the level of your blood pressure and reach or maintain a healthy weight.  General recommendations for healthful eating and cooking habits include: Eating meals and snacks regularly. Avoid going long periods of time without eating to lose weight. Eating a diet that consists  mainly of plant-based foods, such as fruits, vegetables, nuts, legumes, and whole grains. Using low-heat cooking methods, such as baking, instead of high-heat cooking methods, such as deep frying. Work with your dietitian to make sure you understand how to use the Nutrition Facts information on food labels. HOW CAN FOOD AFFECT ME? Carbohydrates Carbohydrates affect your blood glucose level more than any other type of food. Your dietitian will help you determine how many carbohydrates to eat at each meal and teach you how to count carbohydrates. Counting carbohydrates is important to keep your blood glucose at a healthy level, especially if you are using insulin or taking certain medicines for diabetes mellitus. Alcohol Alcohol can cause sudden decreases in blood glucose (hypoglycemia), especially if you use insulin or take certain medicines for diabetes mellitus. Hypoglycemia can be a life-threatening condition. Symptoms of hypoglycemia (sleepiness, dizziness, and disorientation) are similar to symptoms of having too much alcohol.  If your health care provider has given you approval to drink alcohol, do so in moderation and use the following guidelines: Women should not have more than one drink per day, and men should not have more than two drinks per day. One drink is equal to: 12 oz of beer. 5 oz of wine. 1 oz of hard liquor. Do not drink  on an empty stomach. Keep yourself hydrated. Have water, diet soda, or unsweetened iced tea. Regular soda, juice, and other mixers might contain a lot of carbohydrates and should be counted. WHAT FOODS ARE NOT RECOMMENDED? As you make food choices, it is important to remember that all foods are not the same. Some foods have fewer nutrients per serving than other foods, even though they might have the same number of calories or carbohydrates. It is difficult to get your body what it needs when you eat foods with fewer nutrients. Examples of foods that you should avoid that are high in calories and carbohydrates but low in nutrients include: Trans fats (most processed foods list trans fats on the Nutrition Facts label). Regular soda. Juice. Candy. Sweets, such as cake, pie, doughnuts, and cookies. Fried foods. WHAT FOODS CAN I EAT? Eat nutrient-rich foods, which will nourish your body and keep you healthy. The food you should eat also will depend on several factors, including: The calories you need. The medicines you take. Your weight. Your blood glucose level. Your blood pressure level. Your cholesterol level. You should eat a variety of foods, including: Protein. Lean cuts of meat. Proteins low in saturated fats, such as fish, egg whites, and beans. Avoid processed meats. Fruits and vegetables. Fruits and vegetables that may help control blood glucose levels, such as apples, mangoes, and yams. Dairy products. Choose fat-free or low-fat dairy products, such as milk, yogurt, and cheese. Grains, bread, pasta, and rice. Choose whole grain products, such as multigrain bread, whole oats, and brown rice. These foods may help control blood pressure. Fats. Foods containing healthful fats, such as nuts, avocado, olive oil, canola oil, and fish. DOES EVERYONE WITH DIABETES MELLITUS HAVE THE SAME MEAL PLAN? Because every person with diabetes mellitus is different, there is not one meal plan that  works for everyone. It is very important that you meet with a dietitian who will help you create a meal plan that is just right for you.   This information is not intended to replace advice given to you by your health care provider. Make sure you discuss any questions you have with your health care provider.   Document Released:  03/06/2005 Document Revised: 06/30/2014 Document Reviewed: 05/06/2013 Elsevier Interactive Patient Education 2016 [place]. Insulin Treatment for Diabetes Diabetes is a disease that does not go away (chronic). It occurs when the body does not properly use the sugar (glucose) that is released from food after it is digested. Glucose levels are controlled by a hormone called insulin, which is made by your pancreas. Depending on the type of diabetes you have, either of the following will apply:   The pancreas does not make any insulin (type 1 diabetes).  The pancreas makes too little insulin, and the body cannot respond normally to the insulin that is made (type 2 diabetes). Without insulin, death can occur. However, with the addition of insulin, blood sugar monitoring, and treatment, someone with diabetes can live a full and productive life. This document will discuss the role of insulin in your treatment and provide information about its use.  HOW IS INSULIN GIVEN? Insulin is a medicine that can only be given by injection. Taking it by mouth makes it inactive because of the acid in your stomach. Insulin is injected under the skin by a syringe and needle, an insulin pen, a pump, or a jet injector. Your dose will be determined by your health care provider based on your individual needs. You will also be given guidance on which method of giving insulin is right for you. Remember that if you give insulin with a needle and syringe, you must do so using only a special insulin syringe made for this purpose. WHERE ON THE BODY SHOULD INSULIN BE INJECTED? Insulin is injected into  the fatty layer of tissue just under your skin. Good places to inject insulin include the upper arm, the front and outer area of the thigh, the hips, and the abdomen. Giving your insulin in the abdomen is preferred because this provides the most rapid and consistent absorption. Avoid the area 2 inches (5 cm) around the navel and avoid injecting into areas on your body with scar tissue. In addition, it is important to rotate your injection sites with every shot to prevent irritation and improve absorption. WHAT ARE THE DIFFERENT TYPES OF INSULIN?  If you have type 1 diabetes, you must take insulin to stay alive. Your body does not produce it. If you have type 2 diabetes, you might require insulin in addition to, or instead of, other medicines. In either case, proper use of insulin is critical to control your diabetes.  There are a number of different types of insulin. Usually, you will give yourself injections, though others can be trained to give them to you. Some people have an insulin pump that delivers insulin continuously through a tube (cannula) that is placed under the skin. Using insulin requires that you check your blood sugar several times a day. The exact number of times and time of day to check will vary depending on your type of diabetes, your type of insulin, and treatment goals. Your health care provider will direct you.  Generally, different insulins have different properties. The following is a general guide. Specifics will vary by product, and new products are introduced periodically.   Rapid-acting insulin starts working quickly (in as little as 5 minutes) and wears off in 4 to 6 hours (sometimes longer). This type of insulin works well when taken just before a meal to bring your blood sugar quickly back to normal.   Short-acting insulin starts working in about 30 minutes and can last 6 to 10 hours. This type of  insulin should be taken about 30 minutes before you start eating a  meal.  Intermediate-acting insulin starts working in 1-2 hours and wears off after about 10 to 18 hours. This insulin will lower your blood sugar for a longer period of time, but it will not be as effective in lowering your blood sugar right after a meal.   Long-acting insulin mimics the small amount of insulin that your pancreas usually produces throughout the day. You need to have some insulin present at all times. It is crucial to the metabolism of brain cells and other cells. Long-acting insulin is meant to be used either once or twice a day. It is usually used in combination with other types of insulin, or in combination with other diabetes medicines.  Discuss the type of insulin you are taking with your health care provider or pharmacist. You will then be aware of when the insulin can be expected to peak and when it will wear off. This is important to know so you can plan for meal times and periods of exercise.  Your health care provider will usually have a strategy in mind when treating you with insulin. This will vary with your type of diabetes, your diabetes treatment goals, and your health history. It is important that you understand this strategy so you can be an active partner in treating your diabetes. Here are some terms you might hear:   Basal insulin. This refers to the small amount of insulin that needs to be present in your blood at all times. Sometimes oral medicines will be enough. For other people, and especially for people with type 1 diabetes, insulin is needed. Usually, intermediate-acting or long-acting insulin is used once or twice a day to accomplish this.   Prandial (meal-related) insulin. Your blood sugar will rise rapidly after a meal. Rapid-acting or short-acting insulin can be used right before the meal to bring your blood sugar back to normal quickly. You might be instructed to adjust the amount of insulin depending on how much carbohydrate (starch) is in your meal.    Corrective insulin. You might be instructed to check your blood sugar at certain times of the day. You then might use a small amount of rapid-acting or short-acting insulin to bring the blood sugar down to normal if it is elevated.   Tight control (also called intensive therapy). Tight control means keeping your blood sugar as close to your target as possible and keeping it from going too high after meals. People with tight control of their diabetes are shown to have fewer long-term problems from their diabetes.   Glycohemoglobin (also called glyco, glycosylated hemoglobin, hemoglobin A1c, or A1c) level. This measures how well your blood sugar has been controlled during the past 1 to 3 months. It helps your health care provider see how effective your treatment is and decide if any changes are needed. Your health care provider will discuss your target glycohemoglobin level with you.  Insulin treatment requires your careful attention. While you are being treated with insulin, you should check your blood glucose at least two times each day. Treatment plans will be different for different people. Some people do well with a simple program. Others require more complicated programs, with multiple insulin injections daily. You will work with your health care provider to develop the best program for you. Regardless of your insulin treatment plan, you must also do your best on weight control, diet and food choices, exercise, blood pressure control, cholesterol control, and  stress levels.  WHAT ARE THE SIDE EFFECTS OF INSULIN? Although insulin treatment is important, it does have some side effects, such as:   Insulin can cause your blood sugar to go too low (hypoglycemia).   Weight gain can occur.   Improper injection technique can cause hypoglycemia, blood sugar to go too high (hyperglycemia), skin injury or irritation, or other problems. You must learn to inject insulin properly.   This information  is not intended to replace advice given to you by your health care provider. Make sure you discuss any questions you have with your health care provider.   Document Released: 09/05/2008 Document Revised: 06/30/2014 Document Reviewed: 11/21/2012 Elsevier Interactive Patient Education 2016 [employer]. How and Where to Give Subcutaneous Insulin Injections, Adult People with type 1 diabetes must take insulin since their bodies do not make it. People with type 2 diabetes may require insulin. There are many different types of insulin as well as other injectable diabetes medicines that are meant to be injected into the fat layer under your skin. The type of insulin or injectable diabetes medicine you take may determine how many injections you give yourself and when to take the injections.  CHOOSING A SITE FOR INJECTION Insulin absorption varies from site to site. As with any injectable medication it is best for the insulin to be injected within the same body region. However, do not inject the insulin in the same spot each time. Rotating the spots you give your injections will prevent inflammation or tissue breakdown. There are four main regions that can be used for injections. The regions include the:  Abdomen (preferred region, especially for non-insulin injectable diabetes medicine).  Front and upper outer sides of thighs.  Back of upper arm.  Buttocks. USING A SYRINGE AND VIAL Drawing up insulin: single insulin dose  Wash your hands with soap and water.  Gently roll the insulin bottle (vial) between your hands to mix it. Do not shake the vial.  Clean the top rubber part of the vial with an alcohol wipe. Be sure that the plastic pop-top has been removed on newer vials.  Remove the plastic cover from the needle on the syringe. Do not let the needle touch anything.  Pull the plunger back to draw air into the syringe. The air should be the same amount as the insulin dose.  Push the needle  through the rubber on the top of the vial. Do not turn the vial over.  Push the plunger in all the way to put the air into the vial.  Leave the needle in the vial and turn the vial and syringe upside down.  Pull down slowly on the plunger, drawing the amount of insulin you need into the syringe.  Look for air bubbles in the syringe. You may need to push the plunger up and down 2 to 3 times to slowly get rid of any air bubbles in the syringe.  Pull back the plunger to get your correct dose.  Remove the needle from the vial.  Use an alcohol wipe to clean the area of the body to be injected.  Pinch up 1 inch of skin and hold it.  Put the needle straight into the skin (90-degree angle). Put the needle in as far as it will go (to the hub). The needle may need to be injected at a 45-degree angle in small adults with little fat.  When the needle is in, you can let go of your skin.  Push  the plunger down all the way to inject the insulin.  Pull the needle straight out of the skin.  Press the alcohol wipe over the spot where you gave your injection. Keep it there for a few seconds. Do not rub the area.  Do not put the plastic cover back on the needle. Drawing up insulin: mixing 2 insulins  Wash your hands with soap and water.  Gently roll the vial of "cloudy" insulin between your hands or rotate the vial from top to bottom to mix.  Clean the top of both vials with an alcohol wipe. Be sure that the plastic pop-top lid has been removed on newer vials.  Pull air into the syringe to equal the dose of "cloudy" insulin.  Stick the needle into the "cloudy" insulin vial and inject the air. Be sure to keep the vial upright.  Remove the needle from the "cloudy" insulin vial.  Pull air into the syringe to equal the dose of "clear" insulin.  Stick the needle into the "clear" insulin vial and inject the air.  Leave the needle in the "clear" insulin vial and turn the vial upside down.  Pull  down on the plunger and slowly draw into the syringe the number of units of "clear" insulin desired.  Look for air bubbles in the syringe. You may need to push the plunger up and down 2 to 3 times to slowly get rid of any air bubbles in the syringe.  Remove the needle from the "clear" insulin vial.  Stick the needle into the "cloudy" insulin vial. Do not inject any of the "clear" insulin into the "cloudy" vial.  Turn the "cloudy" vial upside down and pull the plunger down to the number of units that equals the total number of units of "clear" and "cloudy" insulins.  Remove the needle from the "cloudy" insulin vial.  Use an alcohol wipe to clean the area of the body to be injected.  Put the needle straight into the skin (90-degree angle). Put the needle in as far as it will go (to the hub). The needle may need to be injected at a 45-degree angle in small adults with little fat.  When the needle is in, you can let go of your skin.  Push the plunger down all the way to inject the insulin.  Pull the needle straight out of the skin.  Press the alcohol wipe over the spot where you gave your injection. Keep it there for a few seconds. Do not rub the area.  Do not put the plastic cover back on the needle. USING INSULIN PENS  Wash your hands with soap and water.  If you are using the "cloudy" insulin, roll the pen between your palms several times or rotate the pen top to bottom several times.  Remove the insulin pen cap.  Clean the rubber stopper of the cartridge with an alcohol wipe.  Remove the protective paper tab from the disposable needle.  Screw the needle onto the pen.  Remove the outer plastic needle cover.  Remove the inner plastic needle cover.  Prime the insulin pen by turning the button (dial) to 2 units. Hold the pen with the needle pointing up, and push the dial on the opposite end until a drop of insulin appears at the needle tip. If no insulin appears, repeat this  step.  Dial the number of units of insulin you will inject.  Use an alcohol wipe to clean the area of the body to  be injected.  Pinch up 1 inch of skin and hold it.  Put the needle straight into the skin (90-degree angle).  Push the dial down to push the insulin into the fat tissue.  Count to 10 slowly. Then, remove the needle from the fat tissue.  Carefully replace the larger outer plastic needle cover over the needle and unscrew the capped needle. THROWING AWAY SUPPLIES  Discard used needles in a puncture proof sharps disposal container. Follow disposal regulations for the area where you live.  Vials and empty disposable pens may be thrown away in the regular trash.   This information is not intended to replace advice given to you by your health care provider. Make sure you discuss any questions you have with your health care provider.   Document Released: 08/30/2003 Document Revised: 06/30/2014 Document Reviewed: 11/16/2012 Elsevier Interactive Patient Education 2016 [place]. Daily Diabetes Record Check your blood glucose (BG) as directed by your health care provider. Use this form to record your results as well as any diabetes medicines you take, including insulin. Checking your BG, recording it, and bringing your records to your health care provider is very helpful in managing your diabetes. These numbers help your health care provider know if any changes are needed to your diabetes plan.  Week of _____________________________ Date: _________  Elita Boone, BG/Medicines: ________________ / __________________________________________________________  LUNCH, BG/Medicines: ____________________ / __________________________________________________________  Wonda Cheng, BG/Medicines: ___________________ / __________________________________________________________  BEDTIME, BG/Medicines: __________________ / __________________________________________________________ Date:  _________  Elita Boone, BG/Medicines: ________________ / __________________________________________________________  LUNCH, BG/Medicines: ____________________ / __________________________________________________________  Wonda Cheng, BG/Medicines: ___________________ / __________________________________________________________  BEDTIME, BG/Medicines: __________________ / __________________________________________________________ Date: _________  Elita Boone, BG/Medicines: ________________ / __________________________________________________________  LUNCH, BG/Medicines: ____________________ / __________________________________________________________  Wonda Cheng, BG/Medicines: ___________________ / __________________________________________________________  BEDTIME, BG/Medicines: __________________ / __________________________________________________________ Date: _________  Elita Boone, BG/Medicines: ________________ / __________________________________________________________  LUNCH, BG/Medicines: ____________________ / __________________________________________________________  Wonda Cheng, BG/Medicines: ___________________ / __________________________________________________________  BEDTIME, BG/Medicines: __________________ / __________________________________________________________ Date: _________  Elita Boone, BG/Medicines: ________________ / __________________________________________________________  LUNCH, BG/Medicines: ____________________ / __________________________________________________________  Wonda Cheng, BG/Medicines: ___________________ / __________________________________________________________  BEDTIME, BG/Medicines: __________________ / __________________________________________________________ Date: _________  Elita Boone, BG/Medicines: ________________ / __________________________________________________________  LUNCH, BG/Medicines: ____________________ /  __________________________________________________________  Wonda Cheng, BG/Medicines: ___________________ / __________________________________________________________  BEDTIME, BG/Medicines: __________________ / __________________________________________________________ Date: _________  Elita Boone, BG/Medicines: ________________ / __________________________________________________________  LUNCH, BG/Medicines: ____________________ / __________________________________________________________  Wonda Cheng, BG/Medicines: ___________________ / __________________________________________________________  BEDTIME, BG/Medicines: __________________ / __________________________________________________________ Notes: __________________________________________________________________________________________________   This information is not intended to replace advice given to you by your health care provider. Make sure you discuss any questions you have with your health care provider.   Document Released: 05/13/2004 Document Revised: 06/30/2014 Document Reviewed: 08/03/2013 Elsevier Interactive Patient Education [employer].

## 2015-09-04 NOTE — Progress Notes (Addendum)
Subjective:    Patient ID: Felicia Gardner, female    DOB: [birth year], 55 y.o.   MRN: [id number] By signing my name below, I, Zola Button, attest that this documentation has been prepared under the direction and in the presence of Delman Cheadle, MD.  Electronically Signed: Zola Button, Medical Scribe. 09/04/2015. 7:36 PM.  Chief Complaint  Patient presents with  . Follow-up    was told she had abnormal lab values and was told to come back today    HPI HPI Comments: Felicia Gardner is a 55 y.o. female who presents to the [facility] for a follow-up. She was seen yesterday by Dr. Everlene Farrier. She presented with SOB, fatigue, weight loss. Patient smoking 0.5 ppd. Tried Chantix but she had some odd symptoms which she did not feel resolved. FMHx includes thyroid disease in many family members. Glucose was 305 so patient was diagnosed with new onset DM. IV placed and patient was transferred to ER and referred to endocrinology. Endocrinology referrals have not been processed. Apparent patient looked up the side effects of Chantix and stated that it can cause diabetes. Patient's sugar was 290, potassium 3.2. Patient was given 40 meqs of potassium PO and 2.5 L of normal saline, repeat BMP showed normal electrolytes so she was started on metformin and discharged. Today, labs that Dr. Everlene Farrier did initially showed a low C-peptide level at 0.49, also showed that she was likely developing Hashimoto's thyroiditis.  Patient has lost 22 pounds since January. She does not have any FMHx of DM. She does not have a glucometer and does not know how to check her blood sugar. Patient has been doing research on diabetes at home. This morning, she ate oatmeal with fruit in it with a cup of coffee. She had a salad and half a pita bread for lunch and bologna and cheese without bread for snack. She has not yet eaten dinner. Patient is on metformin 500 mg BID.  Patient works as a Editor, commissioning at [employer].  Depression  screen [facility] 2/9 09/04/2015  Decreased Interest 0  Down, Depressed, Hopeless 0  PHQ - 2 Score 0    History reviewed. No pertinent past medical history. Past Surgical History  Procedure Laterality Date  . Bunionectomy  2011  . Tubal ligation  1990   Current Outpatient Prescriptions on File Prior to Visit  Medication Sig Dispense Refill  . metFORMIN (GLUCOPHAGE) 500 MG tablet Take 1 tablet (500 mg total) by mouth 2 (two) times daily with a meal. 60 tablet 1   No current facility-administered medications on file prior to visit.   No Known Allergies Family History  Problem Relation Age of Onset  . Heart disease Mother    Social History   Social History  . Marital Status: Married    Spouse Name: N/A  . Number of Children: N/A  . Years of Education: N/A   Social History Main Topics  . Smoking status: Current Every Day Smoker  . Smokeless tobacco: None  . Alcohol Use: Yes  . Drug Use: No  . Sexual Activity: Not Asked   Other Topics Concern  . None   Social History Narrative    Review of Systems  Constitutional: Positive for fatigue and unexpected weight change. Negative for fever, chills, activity change and appetite change.  Eyes: Negative for visual disturbance.  Respiratory: Negative for chest tightness and shortness of breath.   Cardiovascular: Negative for chest pain and palpitations.  Gastrointestinal: Negative for vomiting,  abdominal pain and diarrhea.  Endocrine: Positive for polydipsia, polyphagia and polyuria.  Musculoskeletal: Negative for joint swelling and gait problem.  Allergic/Immunologic: Positive for environmental allergies and immunocompromised state. Negative for food allergies.  Neurological: Negative for numbness.  Hematological: Negative for adenopathy.  Psychiatric/Behavioral: Negative for dysphoric mood. The patient is not nervous/anxious.        Objective:  BP 113/80 mmHg  Pulse 91  Temp(Src) 98 F (36.7 C) (Oral)  Resp 16  Ht 5\' 3"   (1.6 m)  Wt 122 lb 9.6 oz (55.611 kg)  BMI 21.72 kg/m2  SpO2 98%  Physical Exam  Constitutional: She is oriented to person, place, and time. She appears well-developed and well-nourished. No distress.  HENT:  Head: Normocephalic and atraumatic.  Right Ear: External ear normal.  Left Ear: External ear normal.  Mouth/Throat: Oropharynx is clear and moist. No oropharyngeal exudate.  Eyes: Conjunctivae are normal. Pupils are equal, round, and reactive to light. No scleral icterus.  Neck: Normal range of motion. Neck supple. No thyromegaly present.  Cardiovascular: Normal rate, regular rhythm, normal heart sounds and intact distal pulses.   Pulmonary/Chest: Effort normal and breath sounds normal. No respiratory distress.  Musculoskeletal: She exhibits no edema.  Lymphadenopathy:    She has no cervical adenopathy.  Neurological: She is alert and oriented to person, place, and time. No cranial nerve deficit.  Skin: Skin is warm and dry. No rash noted. She is not diaphoretic. No erythema.  Psychiatric: She has a normal mood and affect. Her behavior is normal.  Nursing note and vitals reviewed.         Assessment & Plan:   1. Diabetes mellitus, new onset (HCC)   2. Thyroiditis   3. Insulin dependent type 2 diabetes mellitus, uncontrolled (HCC)   Pt has endocrine referral already P.  She has been started on metformin 500 bid by ER.  As pt seems to be a mixed type 1/2 picture due to low c-peptide and nml body habitus, Start lantus 10u qhs (had pt give first dose in office tonight and gave 1 lantus solostar pen).  Pt taught how to check cbgs, given rx for glucometer kit, start checking cbgs fasting qam and 2 hr pp (only some meals) - record and bring to f/u in 3d w/ Weber for metformin/insulin titration. Will need lantus rx at that point sent in. Can recheck with me 1 wk following (3/24).  Pt seems to be in the midst of developing hashimoto's thyroiditis - thyroid ab are quite high with  thyroid panel mildly (subclinically) abnml.  Recheck in 2-4 wks and cons starting levothyroxine if progresses - esp as will help glucose control.  Orders Placed This Encounter  Procedures  . Ambulatory referral to diabetic education    Referral Priority:  Urgent    Referral Type:  Consultation    Referral Reason:  Specialty Services Required    Number of Visits Requested:  1  . POCT glucose (manual entry)    Meds ordered this encounter  Medications  . insulin glargine (LANTUS) Solostar Pen 10 Units    Sig:   . blood glucose meter kit and supplies KIT    Sig: Dispense based on patient and insurance preference. Use up to four times daily as directed. E11.9    Dispense:  1 each    Refill:  0    Order Specific Question:  Number of strips    Answer:  1000    Order Specific Question:  Number of lancets  Answer:  1000   Over 40 min spent in face-to-face evaluation of and consultation with patient and coordination of care.  Over 50% of this time was spent counseling this patient.  I personally performed the services described in this documentation, which was scribed in my presence. The recorded information has been reviewed and considered, and addended by me as needed.  Delman Cheadle, MD MPH

## 2015-09-05 ENCOUNTER — Other Ambulatory Visit: Payer: Self-pay | Admitting: Emergency Medicine

## 2015-09-05 DIAGNOSIS — E069 Thyroiditis, unspecified: Secondary | ICD-10-CM

## 2015-09-05 DIAGNOSIS — E063 Autoimmune thyroiditis: Secondary | ICD-10-CM | POA: Insufficient documentation

## 2015-09-05 DIAGNOSIS — R739 Hyperglycemia, unspecified: Secondary | ICD-10-CM

## 2015-09-07 ENCOUNTER — Encounter: Payer: Self-pay | Admitting: Endocrinology

## 2015-09-07 ENCOUNTER — Ambulatory Visit (INDEPENDENT_AMBULATORY_CARE_PROVIDER_SITE_OTHER): Payer: BLUE CROSS/BLUE SHIELD | Admitting: Endocrinology

## 2015-09-07 ENCOUNTER — Other Ambulatory Visit: Payer: Self-pay | Admitting: *Deleted

## 2015-09-07 ENCOUNTER — Ambulatory Visit (INDEPENDENT_AMBULATORY_CARE_PROVIDER_SITE_OTHER): Payer: BLUE CROSS/BLUE SHIELD | Admitting: Physician Assistant

## 2015-09-07 VITALS — BP 112/64 | HR 80 | Temp 98.0°F | Resp 16 | Ht 63.0 in | Wt 124.0 lb

## 2015-09-07 VITALS — BP 110/62 | HR 85 | Temp 98.0°F | Resp 14 | Ht 63.0 in | Wt 124.6 lb

## 2015-09-07 DIAGNOSIS — E1065 Type 1 diabetes mellitus with hyperglycemia: Secondary | ICD-10-CM

## 2015-09-07 DIAGNOSIS — E109 Type 1 diabetes mellitus without complications: Secondary | ICD-10-CM

## 2015-09-07 DIAGNOSIS — E063 Autoimmune thyroiditis: Secondary | ICD-10-CM

## 2015-09-07 DIAGNOSIS — E119 Type 2 diabetes mellitus without complications: Secondary | ICD-10-CM

## 2015-09-07 MED ORDER — INSULIN PEN NEEDLE 32G X 4 MM MISC: Status: DC

## 2015-09-07 MED ORDER — INSULIN LISPRO 100 UNIT/ML (KWIKPEN): PEN_INJECTOR | SUBCUTANEOUS | Status: DC

## 2015-09-07 NOTE — Progress Notes (Signed)
Patient ID: Felicia Gardner, female   DOB: February 19, 1961, 55 y.o.   MRN: [id number]           Reason for Appointment : Consultation for Type 1 Diabetes  History of Present Illness          Diagnosis: Type 1 diabetes mellitus, date of diagnosis: 3/13         Recent history:   The patient has lost over 20 pounds in the last 2 months. About a month ago she started having increased thirst and urination and getting progressively more fatigued Last  Weekend she was feeling weak and lightheaded and went to the emergency room   She was started on Lantus insulin and did get hydrated in the emergency room  She is feeling somewhat better and not as weak but is still having increased thirst. Her weight has leveled off now No change in appetite and no nausea recently She was also started on metformin 500 mg twice a day  Labs in the emergency room showed >80 ketones in the urine and CO2 down to 15    INSULIN regimen is: Lantus 10 units in the evening daily  Glucose monitoring:  is being done 1 times a day         Glucometer: One Touch ultra 2 .      Blood Glucose readings from recall: Blood sugars being checked only in the morning with recent readings in the 200+ range       Self-care: The diet that the patient has been following is: Recently eliminating all sugar and high-fat foods She is eating oatmeal in the morning         She has not had any diabetes education as yet  Diabetes labs:  Lab Results  Component Value Date   HGBA1C >14.0 09/03/2015   Lab Results  Component Value Date   CREATININE 0.40* 09/03/2015    No results found for: [facility]     Medication List       This list is accurate as of: 09/07/15  5:09 PM.  Always use your most recent med list.               blood glucose meter kit and supplies Kit  Dispense based on patient and insurance preference. Use up to four times daily as directed. E11.9     insulin lispro 100 UNIT/ML KiwkPen  Commonly known as:   HUMALOG KWIKPEN  6 units before each meal, adjustment as directed     Insulin Pen Needle 32G X 4 MM Misc  Use 4 per day to inject Insulin     metFORMIN 500 MG tablet  Commonly known as:  GLUCOPHAGE  Take 1 tablet (500 mg total) by mouth 2 (two) times daily with a meal.     ONE TOUCH ULTRA TEST test strip  Generic drug:  glucose blood     ONETOUCH DELICA LANCETS FINE Misc        Allergies: No Known Allergies  No past medical history on file.  Past Surgical History  Procedure Laterality Date  . Bunionectomy  2011  . Tubal ligation  1990    Family History  Problem Relation Age of Onset  . Heart disease Mother   . Graves' disease Mother   . Thyroid disease Sister   . Thyroid disease Maternal Grandmother   . Diabetes Neg Hx     Social History:  reports that she has been smoking.  She does not have any smokeless tobacco  history on file. She reports that she drinks alcohol. She reports that she does not use illicit drugs.    Review of Systems       Lipids: No results found for: CHOL, HDL, LDLCALC, LDLDIRECT, TRIG, CHOLHDL      Headaches: None             Skin: No rash or infections     Thyroid:  She does complaining of feeling more cold easily.  Does have fatigue.  She was evaluated for her thyroid because of fatigue and weight loss  Lab Results  Component Value Date   TSH 4.88* 09/03/2015        The blood pressure has been normal previously      No swelling of feet.     No shortness of breath on exertion.     Bowel habits:  normal       No joint  pains.         No history of Numbness, tingling or burning in feet     LABS:  Office Visit on 09/04/2015  Component Date Value Ref Range Status  . POC Glucose 09/04/2015 294* 70 - 99 mg/dl Final  Admission on 09/03/2015, Discharged on 09/03/2015  Component Date Value Ref Range Status  . Sodium 09/03/2015 132* 135 - 145 mmol/L Final  . Potassium 09/03/2015 3.2* 3.5 - 5.1 mmol/L Final  . Chloride  09/03/2015 103  101 - 111 mmol/L Final  . CO2 09/03/2015 15* 22 - 32 mmol/L Final  . Glucose, Bld 09/03/2015 290* 65 - 99 mg/dL Final  . BUN 09/03/2015 6  6 - 20 mg/dL Final  . Creatinine, Ser 09/03/2015 0.61  0.44 - 1.00 mg/dL Final  . Calcium 09/03/2015 8.6* 8.9 - 10.3 mg/dL Final  . GFR calc non Af Amer 09/03/2015 >60  >60 mL/min Final  . GFR calc Af Amer 09/03/2015 >60  >60 mL/min Final   Comment: (NOTE) The eGFR has been calculated using the CKD EPI equation. This calculation has not been validated in all clinical situations. eGFR's persistently <60 mL/min signify possible Chronic Kidney Disease.   . Anion gap 09/03/2015 14  5 - 15 Final  . WBC 09/03/2015 9.0  4.0 - 10.5 K/uL Final  . RBC 09/03/2015 5.05  3.87 - 5.11 MIL/uL Final  . Hemoglobin 09/03/2015 16.0* 12.0 - 15.0 g/dL Final  . HCT 09/03/2015 44.3  36.0 - 46.0 % Final  . MCV 09/03/2015 87.7  78.0 - 100.0 fL Final  . MCH 09/03/2015 31.7  26.0 - 34.0 pg Final  . MCHC 09/03/2015 36.1* 30.0 - 36.0 g/dL Final  . RDW 09/03/2015 13.1  11.5 - 15.5 % Final  . Platelets 09/03/2015 212  150 - 400 K/uL Final  . Color, Urine 09/03/2015 YELLOW  YELLOW Final  . APPearance 09/03/2015 CLEAR  CLEAR Final  . Specific Gravity, Urine 09/03/2015 1.044* 1.005 - 1.030 Final  . pH 09/03/2015 5.5  5.0 - 8.0 Final  . Glucose, UA 09/03/2015 >1000* NEGATIVE mg/dL Final  . Hgb urine dipstick 09/03/2015 NEGATIVE  NEGATIVE Final  . Bilirubin Urine 09/03/2015 NEGATIVE  NEGATIVE Final  . Ketones, ur 09/03/2015 >80* NEGATIVE mg/dL Final  . Protein, ur 09/03/2015 30* NEGATIVE mg/dL Final  . Nitrite 09/03/2015 NEGATIVE  NEGATIVE Final  . Leukocytes, UA 09/03/2015 NEGATIVE  NEGATIVE Final  . Glucose-Capillary 09/03/2015 269* 65 - 99 mg/dL Final  . Squamous Epithelial / LPF 09/03/2015 0-5* NONE SEEN Final  . WBC, UA 09/03/2015 0-5  0 - 5 WBC/hpf Final  . RBC / HPF 09/03/2015 0-5  0 - 5 RBC/hpf Final  . Bacteria, UA 09/03/2015 RARE* NONE SEEN Final  .  Casts 09/03/2015 HYALINE CASTS* NEGATIVE Final   GRANULAR CAST  . Glucose-Capillary 09/03/2015 246* 65 - 99 mg/dL Final  . Sodium 09/03/2015 138  135 - 145 mmol/L Final  . Potassium 09/03/2015 3.7  3.5 - 5.1 mmol/L Final  . Chloride 09/03/2015 104  101 - 111 mmol/L Final  . BUN 09/03/2015 4* 6 - 20 mg/dL Final  . Creatinine, Ser 09/03/2015 0.40* 0.44 - 1.00 mg/dL Final  . Glucose, Bld 09/03/2015 255* 65 - 99 mg/dL Final  . Calcium, Ion 09/03/2015 1.12  1.12 - 1.23 mmol/L Final  . TCO2 09/03/2015 18  0 - 100 mmol/L Final  . Hemoglobin 09/03/2015 12.6  12.0 - 15.0 g/dL Final  . HCT 09/03/2015 37.0  36.0 - 46.0 % Final  Office Visit on 09/03/2015  Component Date Value Ref Range Status  . WBC 09/03/2015 8.8  4.6 - 10.2 K/uL Final  . Lymph, poc 09/03/2015 2.3  0.6 - 3.4 Final  . POC LYMPH PERCENT 09/03/2015 26.2  10 - 50 %L Final  . MID (cbc) 09/03/2015 0.5  0 - 0.9 Final  . POC MID % 09/03/2015 5.5  0 - 12 %M Final  . POC Granulocyte 09/03/2015 6.0  2 - 6.9 Final  . Granulocyte percent 09/03/2015 68.3  37 - 80 %G Final  . RBC 09/03/2015 5.04  4.04 - 5.48 M/uL Final  . Hemoglobin 09/03/2015 16.2  12.2 - 16.2 g/dL Final  . HCT, POC 09/03/2015 45.2  37.7 - 47.9 % Final  . MCV 09/03/2015 89.7  80 - 97 fL Final  . MCH, POC 09/03/2015 32.2* 27 - 31.2 pg Final  . MCHC 09/03/2015 35.9* 31.8 - 35.4 g/dL Final  . RDW, POC 09/03/2015 13.0   Final  . Platelet Count, POC 09/03/2015 215  142 - 424 K/uL Final  . MPV 09/03/2015 9.0  0 - 99.8 fL Final  . POC Glucose 09/03/2015 305* 70 - 99 mg/dl Final  . Sodium 09/03/2015 134* 135 - 146 mmol/L Final  . Potassium 09/03/2015 3.5  3.5 - 5.3 mmol/L Final  . Chloride 09/03/2015 96* 98 - 110 mmol/L Final  . CO2 09/03/2015 18* 20 - 31 mmol/L Final  . Glucose, Bld 09/03/2015 330* 65 - 99 mg/dL Final  . BUN 09/03/2015 6* 7 - 25 mg/dL Final  . Creat 09/03/2015 0.67  0.50 - 1.05 mg/dL Final  . Total Bilirubin 09/03/2015 0.5  0.2 - 1.2 mg/dL Final  . Alkaline  Phosphatase 09/03/2015 121  33 - 130 U/L Final  . AST 09/03/2015 18  10 - 35 U/L Final  . ALT 09/03/2015 21  6 - 29 U/L Final  . Total Protein 09/03/2015 6.4  6.1 - 8.1 g/dL Final  . Albumin 09/03/2015 4.2  3.6 - 5.1 g/dL Final  . Calcium 09/03/2015 9.4  8.6 - 10.4 mg/dL Final  . GFR, Est African American 09/03/2015 >89  >=60 mL/min Final  . GFR, Est Non African American 09/03/2015 >89  >=60 mL/min Final   Comment:   The estimated GFR is a calculation valid for adults (>=54 years old) that uses the CKD-EPI algorithm to adjust for age and sex. It is   not to be used for children, pregnant women, hospitalized patients,    patients on dialysis, or with rapidly changing kidney function. According to the  NKDEP, eGFR >89 is normal, 60-89 shows mild impairment, 30-59 shows moderate impairment, 15-29 shows severe impairment and <15 is ESRD.     . T4, Total 09/03/2015 4.3* 4.5 - 12.0 ug/dL Final  . T3 Uptake 09/03/2015 34  22 - 35 % Final  . Free Thyroxine Index 09/03/2015 1.5  1.4 - 3.8 Final  . TSH 09/03/2015 4.88*  Final   Comment:   Reference Range   > or = 20 Years  0.40-4.50   Pregnancy Range First trimester  0.26-2.66 Second trimester 0.55-2.73 Third trimester  0.43-2.91     . Thyroperoxidase Ab SerPl-aCnc 09/03/2015 >900* <9 IU/mL Final  . Thyroglobulin Ab 09/03/2015 2* <2 IU/mL Final  . Hemoglobin A1C 09/03/2015 >14.0   Final  . C-Peptide 09/03/2015 0.49* 0.80-3.85 ng/mL Final    Physical Examination:  BP 110/62 mmHg  Pulse 85  Temp(Src) 98 F (36.7 C)  Resp 14  Ht 5' 3"  (1.6 m)  Wt 124 lb 9.6 oz (56.518 kg)  BMI 22.08 kg/m2  SpO2 98%  GENERAL: Averagely built and nourished HEENT:         Eye exam shows normal external appearance. Fundus exam shows no retinopathy.  Oral exam shows somewhat dry mucosa .  NECK:         General:  Neck exam shows no lymphadenopathy. Carotids are normal to palpation and no bruit heard.  Thyroid is not enlarged and no nodules felt.     LUNGS:         Chest is symmetrical. Lungs are clear to auscultation.Marland Kitchen   HEART:         Heart sounds:  S1 and S2 are normal. No murmurs or clicks heard., no S3 or S4.   ABDOMEN:  no distention present. Liver and spleen are not palpable. No other mass or tenderness present.  EXTREMITIES:     There is no edema. No skin lesions present.Marland Kitchen  NEUROLOGICAL:        Vibration sense is Not reduced in toes. Ankle jerks are normal bilaterally.          Diabetic foot exam:  Diabetic Foot Exam - Simple   Simple Foot Form  Diabetic Foot exam was performed with the following findings:  Yes 09/07/2015  2:55 PM  Visual Inspection  No deformities, no ulcerations, no other skin breakdown bilaterally:  Yes  Sensation Testing  Intact to touch and monofilament testing bilaterally:  Yes  Pulse Check  Posterior Tibialis and Dorsalis pulse intact bilaterally:  Yes  Comments      MUSCULOSKELETAL:       There is no enlargement or deformity of the joints.  SKIN:       No rash, lesions or abnormal pigmentation       ASSESSMENT:  Diabetes type 1, Newly diagnosed with ketoacidosis She does have type I diabetes with ketonuria and evidence of mild acidosis last weekend in the emergency room  Currently she is still symptomatic from hyperglycemia although slightly better than when she went to the emergency room. Currently does not appear to be excessively dehydrated. Has only been started on basal insulin and blood sugars are still over 200 at home Explained patient in detail the nature of type I diabetes in fact that she has had evidence of ketonuria and acidosis confirming that she has an autoimmune disease  Also discussed in detail the need for her basal bolus insulin regimen and how mealtime insulin will be dosed Discussed actions mealtime insulin, timing of injection, site  rotation and adjustment based on two-hour postprandial readings  Given brochure on general information on management of insulin-dependent  diabetes, information on mealtime insulin  THYROID: Currently does not have any thyroid enlargement.  She does have a minimally increased TSH level Difficult to assess her symptoms are she is symptomatic from her uncontrolled diabetes at this time She does have Hashimoto's thyroiditis with positive antibodies but also a strong family history of autoimmune thyroid disease  PLAN:    Increase Lantus insulin to 12 units  Given patient flowsheet to increase dose every 3 days by 2 units  Novolog insulin 3 units at breakfast and lunch and 4 units at supper  Start checking blood sugars 3-4 times a day including 2 hours after meals  Increased NovoLog 1-2 units at the meals that are making that sugars spike over 200  Consultation with dietitian and nurse educator  Increase fluid intake  Stop metformin  Bring monitor on each visit  Will recheck her TSH in the next 2-3 months and consider supplementation if TSH consistently high and she continues to complain of fatigue   Patient Instructions  Glucose testing:  Check blood sugars on waking up daily Periodically check blood sugar either before lunch or supper   Also check blood sugars about 2 hours after at least 1 meal daily and do this after different meals by rotation  Recommended blood sugar levels on waking up is 90-130 and about 2 hours after meal is 130-160  Please bring your blood sugar monitor to each visit, thank you LANTUS insulin: Increase to 12 units and take it the same time of the day  ADJUSTMENT of Lantus: Every 3 days based on blood sugar in the morning, increasing by 2 units every 3 days until morning sugars are about 130 or below  HUMALOG insulin: This has to be taken just before every meal and will keep your glucose from rising after meals and will last about 4 hours.  Start taking 3 units for breakfast and lunch and 4 units before supper time  If the blood sugar after a meal is over 200 increase the dose by 1  unit for the same meal the next day  Increase fluid intake for now  Stop metformin    Counseling time on subjects discussed above is over 50% of today's 60 minute visit    Lowella Kindley 09/07/2015, 5:09 PM   Note: This note was prepared with Dragon voice recognition system technology. Any transcriptional errors that result from this process are unintentional.

## 2015-09-07 NOTE — Progress Notes (Signed)
Subjective:     Patient ID: Felicia Gardner, female   DOB: 1960/09/05, 55 y.o.   MRN: [id number]  HPI The patient is a 55 year old female with new diagnoses of diabetes mellitus unknown type and thyroiditis. She was seen by Dr. Everlene Farrier 4 days ago with weight loss, body aches, polydipsia and polyuria. She was send to the ED and diagnosed with DM and Thyroiditis. She followed up with Dr. Brigitte Pulse the day after, started on Metformin 500mg  BID and Lantus 10 U at night. Endocrinology referral already sent.  Today she presents for a follow up and blood glucose check. She has been doing well, feels better than she did earlier in the week. Still endorses polydipsia denies polyuria. She endorses feeling overwhelmed by all her new diagnoses. She is going to see the endocrinologists later this afternoon.  Review of Systems All pertinent ROS as above in HPI    Objective:   Physical Exam  Constitutional: She appears well-developed and well-nourished.  HENT:  Head: Normocephalic and atraumatic.  Cardiovascular: Normal rate, regular rhythm, normal heart sounds and intact distal pulses.  Exam reveals no gallop and no friction rub.   No murmur heard. Pulmonary/Chest: Breath sounds normal. No respiratory distress. She has no wheezes. She has no rales.  Abdominal: Soft. Bowel sounds are normal.       Assessment:     The patient is a 55 year old newly diagnosed with diabetes mellitus and thyroiditis. She has been tolerating the metformin well, taking Lantus every night. She tracks BG 4 times a day, currently compliant on her current regimen. BG checked this morning, thus no need to recheck point of care glucose test in office today. She has an appointment with endocrinology this afternoon will no make any medication changes and allow endocrinology to manage medications. Encouraged patient to follow up here once current medication regimen is started and continue to check BG and monitor dietary intake.    Plan:    1.  Thyroiditis, autoimmune 2. Diabetes mellitus, new onset ([place]) - No management at this time - Appointment with endocrinology this afternoon

## 2015-09-07 NOTE — Progress Notes (Signed)
   Felicia Gardner  MRN: [id number] DOB: 07/13/1960  Subjective:  The patient is a 55 year old female with new diagnoses of diabetes mellitus unknown type and thyroiditis. She was seen by Dr. Everlene Farrier 4 days ago with weight loss, body aches, polydipsia and polyuria. She was send to the ED and diagnosed with DM and Thyroiditis. She followed up with Dr. Brigitte Pulse the day after, started on Metformin 535m BID and Lantus 10 U at night. Endocrinology referral already sent.  Today she presents for a follow up and blood glucose check. She has been doing well, feels better than she did earlier in the week. Still endorses polydipsia denies polyuria. She endorses feeling overwhelmed by all her new diagnoses. She is going to see the endocrinologists later this afternoon. She has an appt with endo this afternoon.  Her glucose at home have not been above 300.  She is tolerating the medication ok.  Patient Active Problem List   Diagnosis Date Noted  . Thyroiditis, autoimmune 09/05/2015  . Hyperglycemia 09/03/2015    Current Outpatient Prescriptions on File Prior to Visit  Medication Sig Dispense Refill  . blood glucose meter kit and supplies KIT Dispense based on patient and insurance preference. Use up to four times daily as directed. E11.9 1 each 0  . metFORMIN (GLUCOPHAGE) 500 MG tablet Take 1 tablet (500 mg total) by mouth 2 (two) times daily with a meal. 60 tablet 1   Current Facility-Administered Medications on File Prior to Visit  Medication Dose Route Frequency Provider Last Rate Last Dose  . insulin glargine (LANTUS) Solostar Pen 10 Units  10 Units Subcutaneous Once EShawnee Knapp MD        No Known Allergies  Review of Systems Objective:  BP 112/64 mmHg  Pulse 80  Temp(Src) 98 F (36.7 C) (Oral)  Resp 16  Ht 5' 3" (1.6 m)  Wt 124 lb (56.246 kg)  BMI 21.97 kg/m2  SpO2 96%  Physical Exam  Constitutional: She is oriented to person, place, and time and well-developed, well-nourished, and in no  distress.  HENT:  Head: Normocephalic and atraumatic.  Right Ear: Hearing and external ear normal.  Left Ear: Hearing and external ear normal.  Eyes: Conjunctivae are normal.  Neck: Normal range of motion.  Pulmonary/Chest: Effort normal.  Neurological: She is alert and oriented to person, place, and time. Gait normal.  Skin: Skin is warm and dry.  Psychiatric: Mood, memory, affect and judgment normal.  Vitals reviewed.   Assessment and Plan :  Thyroiditis, autoimmune  Diabetes [place]   No blood work today - pr has appt with endo in a few hours - she will continue her care for both these new diagnoses with them.  SWindell HummingbirdPA-C  [facility] 09/07/2015 10:33 AM

## 2015-09-07 NOTE — Patient Instructions (Addendum)
Glucose testing:  Check blood sugars on waking up daily Periodically check blood sugar either before lunch or supper   Also check blood sugars about 2 hours after at least 1 meal daily and do this after different meals by rotation  Recommended blood sugar levels on waking up is 90-130 and about 2 hours after meal is 130-160  Please bring your blood sugar monitor to each visit, thank you LANTUS insulin: Increase to 12 units and take it the same time of the day  ADJUSTMENT of Lantus: Every 3 days based on blood sugar in the morning, increasing by 2 units every 3 days until morning sugars are about 130 or below  HUMALOG insulin: This has to be taken just before every meal and will keep your glucose from rising after meals and will last about 4 hours.  Start taking 3 units for breakfast and lunch and 4 units before supper time  If the blood sugar after a meal is over 200 increase the dose by 1 unit for the same meal the next day  Increase fluid intake for now  Stop metformin

## 2015-09-09 DIAGNOSIS — E109 Type 1 diabetes mellitus without complications: Secondary | ICD-10-CM | POA: Insufficient documentation

## 2015-09-12 ENCOUNTER — Encounter: Payer: BLUE CROSS/BLUE SHIELD | Attending: Endocrinology | Admitting: Nutrition

## 2015-09-12 DIAGNOSIS — E1065 Type 1 diabetes mellitus with hyperglycemia: Secondary | ICD-10-CM | POA: Insufficient documentation

## 2015-09-24 ENCOUNTER — Other Ambulatory Visit: Payer: Self-pay | Admitting: *Deleted

## 2015-09-24 ENCOUNTER — Ambulatory Visit: Payer: BLUE CROSS/BLUE SHIELD | Admitting: Endocrinology

## 2015-09-24 ENCOUNTER — Encounter: Payer: Self-pay | Admitting: Endocrinology

## 2015-09-24 ENCOUNTER — Ambulatory Visit (INDEPENDENT_AMBULATORY_CARE_PROVIDER_SITE_OTHER): Payer: BLUE CROSS/BLUE SHIELD | Admitting: Endocrinology

## 2015-09-24 VITALS — BP 110/68 | HR 75 | Temp 98.3°F | Resp 14 | Ht 63.0 in | Wt 125.6 lb

## 2015-09-24 DIAGNOSIS — E1065 Type 1 diabetes mellitus with hyperglycemia: Secondary | ICD-10-CM | POA: Diagnosis not present

## 2015-09-24 DIAGNOSIS — R7989 Other specified abnormal findings of blood chemistry: Secondary | ICD-10-CM | POA: Diagnosis not present

## 2015-09-24 MED ORDER — INSULIN GLARGINE 100 UNIT/ML SOLOSTAR PEN: 13.0000 [IU] | PEN_INJECTOR | Freq: Every day | SUBCUTANEOUS | Status: DC

## 2015-09-24 NOTE — Progress Notes (Signed)
Patient ID: Felicia Gardner, female   DOB: November 26, 1960, 55 y.o.   MRN: [id number]           Reason for Appointment : Follow-up for Type 1 Diabetes  History of Present Illness          Diagnosis: Type 1 diabetes mellitus, date of diagnosis: 09/03/15        Diabetes  history:    She had presented to the emergency room with weight loss, increased thirst and urination and had marked hyperglycemia  Labs in the emergency room showed >80 ketones in the urine and CO2 down to 15  On her initial consultation she was started on mealtime insulin with NovoLog and Lantus dose was increased to 12 units    INSULIN regimen is: Lantus 15 units in the evening daily. Novolog 6 acs tid  Current blood sugar patterns, management and problems:  As instructed she has gone up gradually on her Lantus dose to improve her fasting readings  In the last 4 mornings her fasting readings have been near normal  Postprandial readings are appearing higher after breakfast but not consistently  Blood sugars are lowest in the afternoon after lunch  Has variable readings after supper but more recently not consistently high  Has had low sugar once only before lunch, does not consistently have protein in the morning  She has tried to increase her Novolog with her blood sugars going up after meals and they are looking fairly good now.  However he is taking the same dose with every meal and not adjusting dosage based on carbohydrates  Glucose monitoring:  is being done 1 times a day         Glucometer: One Touch ultra 2 .      Blood Glucose readings from monitor download  Mean values apply above for all meters except median for One Touch  PRE-MEAL Fasting Lunch Dinner Bedtime Overall  Glucose range: 83-143       Mean/median: 133     133+/-56    POST-MEAL PC Breakfast PC Lunch PC Dinner  Glucose range: 125-210  64-234 64-234   Mean/median:   123      Self-care: The diet that the patient has been following is:  generally low fat and moderate in carbohydrates  She is eating oatmeal in the morning         CDE consultation:   Wt Readings from Last 3 Encounters:  09/24/15 125 lb 9.6 oz (56.972 kg)  09/07/15 124 lb 9.6 oz (56.518 kg)  09/07/15 124 lb (56.246 kg)   Diabetes labs:  Lab Results  Component Value Date   HGBA1C >14.0 09/03/2015   Lab Results  Component Value Date   CREATININE 0.40* 09/03/2015    No results found for: MICRALBCREAT     Medication List       This list is accurate as of: 09/24/15 11:59 PM.  Always use your most recent med list.               Alpha Lipoic Acid 200 MG Caps  Take by mouth.     blood glucose meter kit and supplies Kit  Dispense based on patient and insurance preference. Use up to four times daily as directed. E11.9     Chromium Picolinate 1000 MCG Tabs  Take by mouth.     Cinnamon 500 MG capsule  Take 500 mg by mouth daily.     Insulin Glargine 100 UNIT/ML Solostar Pen  Commonly known as:  LANTUS SOLOSTAR  Inject 13 Units into the skin daily at 10 pm.     insulin lispro 100 UNIT/ML KiwkPen  Commonly known as:  HUMALOG KWIKPEN  6 units before each meal, adjustment as directed     Insulin Pen Needle 32G X 4 MM Misc  Use 4 per day to inject Insulin     LUTEIN 20 PO  Take by mouth.     metFORMIN 500 MG tablet  Commonly known as:  GLUCOPHAGE  Take 1 tablet (500 mg total) by mouth 2 (two) times daily with a meal.     multivitamin with minerals Tabs tablet  Take 1 tablet by mouth daily.     ONE TOUCH ULTRA TEST test strip  Generic drug:  glucose blood     ONETOUCH DELICA LANCETS FINE Misc     vitamin B-12 1000 MCG tablet  Commonly known as:  CYANOCOBALAMIN  Take 1,000 mcg by mouth daily.     VITAMIN D-3 PO  Take by mouth.        Allergies: No Known Allergies  No past medical history on file.  Past Surgical History  Procedure Laterality Date  . Bunionectomy  2011  . Tubal ligation  1990    Family History    Problem Relation Age of Onset  . Heart disease Mother   . Graves' disease Mother   . Thyroid disease Sister   . Thyroid disease Maternal Grandmother   . Diabetes Neg Hx     Social History:  reports that she has been smoking.  She does not have any smokeless tobacco history on file. She reports that she drinks alcohol. She reports that she does not use illicit drugs.    Review of Systems       Lipids: No results found for: CHOL, HDL, LDLCALC, LDLDIRECT, TRIG, CHOLHDL      Thyroid:  She does complaining of feeling more cold easily.  Does have fatigue.  She was evaluated for her thyroid because of fatigue and weight loss  Lab Results  Component Value Date   TSH 4.88* 09/03/2015      LABS:  No visits with results within 1 Week(s) from this visit. Latest known visit with results is:  Office Visit on 09/04/2015  Component Date Value Ref Range Status  . POC Glucose 09/04/2015 294* 70 - 99 mg/dl Final    Physical Examination:  BP 110/68 mmHg  Pulse 75  Temp(Src) 98.3 F (36.8 C)  Resp 14  Ht _0  (1.6 m)  Wt 125 lb 9.6 oz (56.972 kg)  BMI 22.25 kg/m2  SpO2 96%        ASSESSMENT:  Diabetes type 1, Recently diagnosed  See history of present illness for detailed discussion of current diabetes management, blood sugar patterns and problems identified Her blood sugars are improving significantly with increasing her basal and bolus insulins both  Her fasting blood sugars are coming down in the last 3-4 days even without any increase in her Lantus and most likely this is resulting from decreased glucose toxicity and possibly early honeymoon phase She does need to adjust her Novolog based on carbohydrate intake She is still doing well with checking her blood sugars and improving her diet overall   PLAN:    Reduce Lantus by 2 units for now  Take 4 units for lunchtime coverage unless eating a sandwich or more carbohydrate  Continue 5 units at breakfast and supper and  also adjust based on carbohydrate intake  Consider  basing her insulin on carbohydrate counting, will discuss this on the next visit  Discussed timing of insulin and adding protein to breakfast, effects of exercise  She will call if she has any difficulty with insulin adjustment   Check thyroid levels on the next visit    Patient Instructions  Lantus 13 units and 4-5 Novolog at meals     Counseling time on subjects discussed above is over 50% of today's 25 minute visit     Felicia Gardner 09/25/2015, 10:25 AM   Note: This note was prepared with Dragon voice recognition system technology. Any transcriptional errors that result from this process are unintentional.

## 2015-09-24 NOTE — Patient Instructions (Signed)
Lantus 13 units and 4-5 Novolog at meals

## 2015-10-04 ENCOUNTER — Encounter: Payer: Self-pay | Admitting: Family Medicine

## 2015-10-04 ENCOUNTER — Ambulatory Visit (INDEPENDENT_AMBULATORY_CARE_PROVIDER_SITE_OTHER): Payer: BLUE CROSS/BLUE SHIELD | Admitting: Family Medicine

## 2015-10-04 VITALS — BP 98/64 | HR 80 | Temp 97.9°F | Resp 16 | Ht 62.5 in | Wt 122.6 lb

## 2015-10-04 DIAGNOSIS — E109 Type 1 diabetes mellitus without complications: Secondary | ICD-10-CM

## 2015-10-04 DIAGNOSIS — Z124 Encounter for screening for malignant neoplasm of cervix: Secondary | ICD-10-CM

## 2015-10-04 DIAGNOSIS — Z113 Encounter for screening for infections with a predominantly sexual mode of transmission: Secondary | ICD-10-CM

## 2015-10-04 DIAGNOSIS — Z1239 Encounter for other screening for malignant neoplasm of breast: Secondary | ICD-10-CM | POA: Diagnosis not present

## 2015-10-04 DIAGNOSIS — Z1212 Encounter for screening for malignant neoplasm of rectum: Secondary | ICD-10-CM

## 2015-10-04 DIAGNOSIS — E785 Hyperlipidemia, unspecified: Secondary | ICD-10-CM

## 2015-10-04 DIAGNOSIS — F17200 Nicotine dependence, unspecified, uncomplicated: Secondary | ICD-10-CM | POA: Diagnosis not present

## 2015-10-04 DIAGNOSIS — Z0000 Encounter for general adult medical examination without abnormal findings: Secondary | ICD-10-CM

## 2015-10-04 DIAGNOSIS — Z1211 Encounter for screening for malignant neoplasm of colon: Secondary | ICD-10-CM | POA: Diagnosis not present

## 2015-10-04 DIAGNOSIS — Z1389 Encounter for screening for other disorder: Secondary | ICD-10-CM | POA: Diagnosis not present

## 2015-10-04 DIAGNOSIS — Z136 Encounter for screening for cardiovascular disorders: Secondary | ICD-10-CM

## 2015-10-04 DIAGNOSIS — Z23 Encounter for immunization: Secondary | ICD-10-CM | POA: Diagnosis not present

## 2015-10-04 DIAGNOSIS — Z1383 Encounter for screening for respiratory disorder NEC: Secondary | ICD-10-CM | POA: Diagnosis not present

## 2015-10-04 DIAGNOSIS — E063 Autoimmune thyroiditis: Secondary | ICD-10-CM

## 2015-10-04 LAB — COMPREHENSIVE METABOLIC PANEL
ALBUMIN: 4.2 g/dL (ref 3.6–5.1)
ALT: 18 U/L (ref 6–29)
AST: 20 U/L (ref 10–35)
Alkaline Phosphatase: 98 U/L (ref 33–130)
BILIRUBIN TOTAL: 0.5 mg/dL (ref 0.2–1.2)
BUN: 12 mg/dL (ref 7–25)
CALCIUM: 9.7 mg/dL (ref 8.6–10.4)
CO2: 26 mmol/L (ref 20–31)
Chloride: 106 mmol/L (ref 98–110)
Creat: 0.57 mg/dL (ref 0.50–1.05)
Glucose, Bld: 104 mg/dL — ABNORMAL HIGH (ref 65–99)
Potassium: 4.4 mmol/L (ref 3.5–5.3)
Sodium: 143 mmol/L (ref 135–146)
Total Protein: 6.7 g/dL (ref 6.1–8.1)

## 2015-10-04 LAB — POCT URINALYSIS DIP (MANUAL ENTRY)
Bilirubin, UA: NEGATIVE
GLUCOSE UA: NEGATIVE
Leukocytes, UA: NEGATIVE
NITRITE UA: NEGATIVE
PH UA: 5.5
PROTEIN UA: NEGATIVE
RBC UA: NEGATIVE
Spec Grav, UA: 1.025
UROBILINOGEN UA: 0.2

## 2015-10-04 LAB — HIV ANTIBODY (ROUTINE TESTING W REFLEX): HIV: NONREACTIVE

## 2015-10-04 LAB — MICROALBUMIN / CREATININE URINE RATIO
Creatinine, Urine: 170 mg/dL (ref 20–320)
MICROALB/CREAT RATIO: 4 ug/mg{creat} (ref <30)
Microalb, Ur: 0.6 mg/dL

## 2015-10-04 LAB — THYROID PANEL WITH TSH
Free Thyroxine Index: 1.5 (ref 1.4–3.8)
T3 UPTAKE: 27 % (ref 22–35)
T4 TOTAL: 5.5 ug/dL (ref 4.5–12.0)
TSH: 4.39 mIU/L

## 2015-10-04 LAB — LIPID PANEL
CHOL/HDL RATIO: 3.2 ratio (ref <=5.0)
Cholesterol: 250 mg/dL — ABNORMAL HIGH (ref 125–200)
HDL: 77 mg/dL (ref >=46)
LDL CALC: 158 mg/dL — AB (ref <130)
TRIGLYCERIDES: 73 mg/dL (ref <150)
VLDL: 15 mg/dL (ref <30)

## 2015-10-04 LAB — HEPATITIS C ANTIBODY: HCV AB: NEGATIVE

## 2015-10-04 NOTE — Progress Notes (Signed)
Subjective:    Patient ID: Felicia Gardner, female    DOB: October 16, 1960, 55 y.o.   MRN: [id number] Chief Complaint  Patient presents with  . Annual Exam    HPI  Felicia Gardner is a 55 yo here for a complete physical.  I first met her a month prior in review of her chronic medical probs.  Primary prevention: EKG: 09/03/15 Immunizations: no clue about her last tetanus. Pap:  Was about 6 years, has a distant h/o abnml pap in 20s with cone biopsy of cervix with many years of normal since.  Did have a vaginal yeast infection which she treated with otc monistat Mam: Normal mammogram CRS: None prior Supplements - takes diabetic vit pack with cinnamon, vit B12, vit D. Lifestyle - has been watching carbohydrates and has been learning how ot count carbs.  Diagnosed with type 1 DM.   Chronic medical conditions: DM: Seeing Dr. Dwyane Dee. Newly diagnosed 6 wks prior with type 1. On short and long-acting insulin Tobacco use: smoking 1/2 ppd. Had odd side effects on Chantix. Back up to 1 ppd and wants to quit but does not want to Chantix and Wellbutrin gave rash to palms and soles but hasn't tried nicotine replacement.   Suspected Hashimoto's thyroiditis on labs 6 wks prior as thyroid ab were quite high with thyroid panel mildly (subclinically) abnml.   Works as a Editor, commissioning at [employer] are coming down from [place] in May for [place].  Past Medical History  Diagnosis Date  . Diabetes mellitus without complication ([place]) 11/7891    diagnosed   Past Surgical History  Procedure Laterality Date  . Bunionectomy  2011  . Tubal ligation  1990   Current Outpatient Prescriptions on File Prior to Visit  Medication Sig Dispense Refill  . Alpha Lipoic Acid 200 MG CAPS Take by mouth.    . Cholecalciferol (VITAMIN D-3 PO) Take by mouth.    . Chromium Picolinate 1000 MCG TABS Take by mouth.    . Cinnamon 500 MG capsule Take 500 mg by mouth daily.    . Insulin Glargine (LANTUS SOLOSTAR) 100 UNIT/ML  Solostar Pen Inject 13 Units into the skin daily at 10 pm. 5 pen 2  . insulin lispro (HUMALOG KWIKPEN) 100 UNIT/ML KiwkPen 6 units before each meal, adjustment as directed 15 mL 1  . Misc Natural Products (LUTEIN 20 PO) Take by mouth.    . Multiple Vitamin (MULTIVITAMIN WITH MINERALS) TABS tablet Take 1 tablet by mouth daily.    . vitamin B-12 (CYANOCOBALAMIN) 1000 MCG tablet Take 1,000 mcg by mouth daily.    . blood glucose meter kit and supplies KIT Dispense based on patient and insurance preference. Use up to four times daily as directed. E11.9 1 each 0  . Insulin Pen Needle 32G X 4 MM MISC Use 4 per day to inject Insulin 130 each 1  . ONE TOUCH ULTRA TEST test strip     . ONETOUCH DELICA LANCETS FINE MISC      Current Facility-Administered Medications on File Prior to Visit  Medication Dose Route Frequency Provider Last Rate Last Dose  . insulin glargine (LANTUS) Solostar Pen 10 Units  10 Units Subcutaneous Once Shawnee Knapp, MD       No Known Allergies Family History  Problem Relation Age of Onset  . Heart disease Mother   . Graves' disease Mother   . Thyroid disease Sister   . Thyroid disease Maternal Grandmother   . Diabetes  Neg Hx   . Hypertension Father   . Crohn's disease Father   . Rheum arthritis Father    Social History   Social History  . Marital Status: Married    Spouse Name: N/A  . Number of Children: N/A  . Years of Education: N/A   Occupational History  . rehab tech    Social History Main Topics  . Smoking status: Current Every Day Smoker -- 1.00 packs/day for 41 years    Types: Cigarettes  . Smokeless tobacco: None  . Alcohol Use: No  . Drug Use: No  . Sexual Activity: Not Asked   Other Topics Concern  . None   Social History Narrative    Review of Systems  All other systems reviewed and are negative.      Objective:  BP 98/64 mmHg  Pulse 80  Temp(Src) 97.9 F (36.6 C) (Oral)  Resp 16  Ht 5' 2.5" (1.588 m)  Wt 122 lb 9.6 oz (55.611 kg)   BMI 22.05 kg/m2  SpO2 97%  Physical Exam  Constitutional: She is oriented to person, place, and time. She appears well-developed and well-nourished. No distress.  HENT:  Head: Normocephalic and atraumatic.  Right Ear: Tympanic membrane, external ear and ear canal normal.  Left Ear: Tympanic membrane, external ear and ear canal normal.  Nose: Mucosal edema present. No rhinorrhea.  Mouth/Throat: Uvula is midline, oropharynx is clear and moist and mucous membranes are normal. No posterior oropharyngeal erythema.  Eyes: Conjunctivae and EOM are normal. Pupils are equal, round, and reactive to light. Right eye exhibits no discharge. Left eye exhibits no discharge. No scleral icterus.  Neck: Normal range of motion. Neck supple. No thyromegaly present.  Cardiovascular: Normal rate, regular rhythm, normal heart sounds and intact distal pulses.   Pulmonary/Chest: Effort normal and breath sounds normal. No respiratory distress.  Abdominal: Soft. Bowel sounds are normal. There is no tenderness.  Genitourinary: Vagina normal and uterus normal. No breast swelling, tenderness, discharge or bleeding. Cervix exhibits no motion tenderness and no friability. Right adnexum displays no mass and no tenderness. Left adnexum displays no mass and no tenderness.  Musculoskeletal: She exhibits no edema.  Lymphadenopathy:    She has no cervical adenopathy.  Neurological: She is alert and oriented to person, place, and time. She has normal reflexes.  Skin: Skin is warm and dry. She is not diaphoretic. No erythema.  Psychiatric: She has a normal mood and affect. Her behavior is normal.     Visual Acuity Screening   Right eye Left eye Both eyes  Without correction: 20/30 20/50 20/25  With correction:            Assessment & Plan:    pneumovax-23 and tdap done today. Gets flu vaccine at work  1. Annual physical exam   2. Routine screening for STI (sexually transmitted infection)   3. Screening for  cardiovascular, respiratory, and genitourinary diseases   4. Screening for breast cancer   5. Screening for cervical cancer   6. Screening for colorectal cancer - refer for colonoscopy  7. Thyroiditis, autoimmune - resolved since cbgs have started to be controlled but will follow twice a year since she did have tpo antibodies + to high to measure last month.  8. Type 1 diabetes mellitus without complication ([place]) - follow-up annually with optho.  Cont asa 76m qd - already dramatically improved on insulin and doing great - following closely with endocrine Dr. KDwyane Dee 9. Tobacco use disorder - contemplative -  encouraged pt to pick a stop date - she plans to but needs to do it at the same time as her husband  41. Hyperlipidemia LDL goal <100 - ldl much above goal at 158 so start pravastatin 20 - needs alt in 6-8 wks and repeat lipid panel 4-6 mos.    Orders Placed This Encounter  Procedures  . Tdap vaccine greater than or equal to 7yo IM  . Pneumococcal polysaccharide vaccine 23-valent greater than or equal to 2yo subcutaneous/IM  . Lipid panel    Order Specific Question:  Has the patient fasted?    Answer:  Yes  . Thyroid Panel With TSH  . Comprehensive metabolic panel    Order Specific Question:  Has the patient fasted?    Answer:  Yes  . HIV antibody  . Hepatitis C Antibody  . Microalbumin/Creatinine Ratio, Urine  . Ambulatory referral to Gastroenterology    Referral Priority:  Routine    Referral Type:  Consultation    Referral Reason:  Specialty Services Required    Number of Visits Requested:  1  . POCT urinalysis dipstick    Meds ordered this encounter  Medications  . pravastatin (PRAVACHOL) 20 MG tablet    Sig: Take 1 tablet (20 mg total) by mouth daily.    Dispense:  90 tablet    Refill:  1     Delman Cheadle, MD MPH

## 2015-10-04 NOTE — Patient Instructions (Addendum)
[facility] is now offering annual lung cancer screening by low-dose CT scan.  This is covered for qualifying patients and your insurance will be checked before the procedure.  Call the lung cancer screening nurse navigators at [phone] to learn more about this and get scheduled.  Steps to Quit Smoking  Smoking tobacco can be harmful to your health and can affect almost every organ in your body. Smoking puts you, and those around you, at risk for developing many serious chronic diseases. Quitting smoking is difficult, but it is one of the best things that you can do for your health. It is never too late to quit. WHAT ARE THE BENEFITS OF QUITTING SMOKING? When you quit smoking, you lower your risk of developing serious diseases and conditions, such as:  Lung cancer or lung disease, such as COPD.  Heart disease.  Stroke.  Heart attack.  Infertility.  Osteoporosis and bone fractures. Additionally, symptoms such as coughing, wheezing, and shortness of breath may get better when you quit. You may also find that you get sick less often because your body is stronger at fighting off colds and infections. If you are pregnant, quitting smoking can help to reduce your chances of having a baby of low birth weight. HOW DO I GET READY TO QUIT? When you decide to quit smoking, create a plan to make sure that you are successful. Before you quit:  Pick a date to quit. Set a date within the next two weeks to give you time to prepare.  Write down the reasons why you are quitting. Keep this list in places where you will see it often, such as on your bathroom mirror or in your car or wallet.  Identify the people, places, things, and activities that make you want to smoke (triggers) and avoid them. Make sure to take these actions:  Throw away all cigarettes at home, at work, and in your car.  Throw away smoking accessories, such as Scientist, research (medical).  Clean your car and make sure to empty the  ashtray.  Clean your home, including curtains and carpets.  Tell your family, friends, and coworkers that you are quitting. Support from your loved ones can make quitting easier.  Talk with your health care provider about your options for quitting smoking.  Find out what treatment options are covered by your health insurance. WHAT STRATEGIES CAN I USE TO QUIT SMOKING?  Talk with your healthcare provider about different strategies to quit smoking. Some strategies include:  Quitting smoking altogether instead of gradually lessening how much you smoke over a period of time. Research shows that quitting "cold [place]" is more successful than gradually quitting.  Attending in-person counseling to help you build problem-solving skills. You are more likely to have success in quitting if you attend several counseling sessions. Even short sessions of 10 minutes can be effective.  Finding resources and support systems that can help you to quit smoking and remain smoke-free after you quit. These resources are most helpful when you use them often. They can include:  Online chats with a Social worker.  Telephone quitlines.  Printed Furniture conservator/restorer.  Support groups or group counseling.  Text messaging programs.  Mobile phone applications.  Taking medicines to help you quit smoking. (If you are pregnant or breastfeeding, talk with your health care provider first.) Some medicines contain nicotine and some do not. Both types of medicines help with cravings, but the medicines that include nicotine help to relieve withdrawal symptoms. Your health care  provider may recommend:  Nicotine patches, gum, or lozenges.  Nicotine inhalers or sprays.  Non-nicotine medicine that is taken by mouth. Talk with your health care provider about combining strategies, such as taking medicines while you are also receiving in-person counseling. Using these two strategies together makes you more likely to succeed in  quitting than if you used either strategy on its own. If you are pregnant or breastfeeding, talk with your health care provider about finding counseling or other support strategies to quit smoking. Do not take medicine to help you quit smoking unless told to do so by your health care provider. WHAT THINGS CAN I DO TO MAKE IT EASIER TO QUIT? Quitting smoking might feel overwhelming at first, but there is a lot that you can do to make it easier. Take these important actions:  Reach out to your family and friends and ask that they support and encourage you during this time. Call telephone quitlines, reach out to support groups, or work with a counselor for support.  Ask people who smoke to avoid smoking around you.  Avoid places that trigger you to smoke, such as bars, parties, or smoke-break areas at work.  Spend time around people who do not smoke.  Lessen stress in your life, because stress can be a smoking trigger for some people. To lessen stress, try:  Exercising regularly.  Deep-breathing exercises.  Yoga.  Meditating.  Performing a body scan. This involves closing your eyes, scanning your body from head to toe, and noticing which parts of your body are particularly tense. Purposefully relax the muscles in those areas.  Download or purchase mobile phone or tablet apps (applications) that can help you stick to your quit plan by providing reminders, tips, and encouragement. There are many free apps, such as QuitGuide from the [employer] Office manager for Disease Control and Prevention). You can find other support for quitting smoking (smoking cessation) through smokefree.gov and other websites. HOW WILL I FEEL WHEN I QUIT SMOKING? Within the first 24 hours of quitting smoking, you may start to feel some withdrawal symptoms. These symptoms are usually most noticeable 2-3 days after quitting, but they usually do not last beyond 2-3 weeks. Changes or symptoms that you might experience include:  Mood  swings.  Restlessness, anxiety, or irritation.  Difficulty concentrating.  Dizziness.  Strong cravings for sugary foods in addition to nicotine.  Mild weight gain.  Constipation.  Nausea.  Coughing or a sore throat.  Changes in how your medicines work in your body.  A depressed mood.  Difficulty sleeping (insomnia). After the first 2-3 weeks of quitting, you may start to notice more positive results, such as:  Improved sense of smell and taste.  Decreased coughing and sore throat.  Slower heart rate.  Lower blood pressure.  Clearer skin.  The ability to breathe more easily.  Fewer sick days. Quitting smoking is very challenging for most people. Do not get discouraged if you are not successful the first time. Some people need to make many attempts to quit before they achieve long-term success. Do your best to stick to your quit plan, and talk with your health care provider if you have any questions or concerns.   This information is not intended to replace advice given to you by your health care provider. Make sure you discuss any questions you have with your health care provider.   Document Released: 06/03/2001 Document Revised: 10/24/2014 Document Reviewed: 10/24/2014 Elsevier Interactive Patient Education 2016 [employer].  Smoking Cessation,  Tips for Success If you are ready to quit smoking, congratulations! You have chosen to help yourself be healthier. Cigarettes bring nicotine, tar, carbon monoxide, and other irritants into your body. Your lungs, heart, and blood vessels will be able to work better without these poisons. There are many different ways to quit smoking. Nicotine gum, nicotine patches, a nicotine inhaler, or nicotine nasal spray can help with physical craving. Hypnosis, support groups, and medicines help break the habit of smoking. WHAT THINGS CAN I DO TO MAKE QUITTING EASIER?  Here are some tips to help you quit for good:  Pick a date when you  will quit smoking completely. Tell all of your friends and family about your plan to quit on that date.  Do not try to slowly cut down on the number of cigarettes you are smoking. Pick a quit date and quit smoking completely starting on that day.  Throw away all cigarettes.   Clean and remove all ashtrays from your home, work, and car.  On a card, write down your reasons for quitting. Carry the card with you and read it when you get the urge to smoke.  Cleanse your body of nicotine. Drink enough water and fluids to keep your urine clear or pale yellow. Do this after quitting to flush the nicotine from your body.  Learn to predict your moods. Do not let a bad situation be your excuse to have a cigarette. Some situations in your life might tempt you into wanting a cigarette.  Never have "just one" cigarette. It leads to wanting another and another. Remind yourself of your decision to quit.  Change habits associated with smoking. If you smoked while driving or when feeling stressed, try other activities to replace smoking. Stand up when drinking your coffee. Brush your teeth after eating. Sit in a different chair when you read the paper. Avoid alcohol while trying to quit, and try to drink fewer caffeinated beverages. Alcohol and caffeine may urge you to smoke.  Avoid foods and drinks that can trigger a desire to smoke, such as sugary or spicy foods and alcohol.  Ask people who smoke not to smoke around you.  Have something planned to do right after eating or having a cup of coffee. For example, plan to take a walk or exercise.  Try a relaxation exercise to calm you down and decrease your stress. Remember, you may be tense and nervous for the first 2 weeks after you quit, but this will pass.  Find new activities to keep your hands busy. Play with a pen, coin, or rubber band. Doodle or draw things on paper.  Brush your teeth right after eating. This will help cut down on the craving for the  taste of tobacco after meals. You can also try mouthwash.   Use oral substitutes in place of cigarettes. Try using lemon drops, carrots, cinnamon sticks, or chewing gum. Keep them handy so they are available when you have the urge to smoke.  When you have the urge to smoke, try deep breathing.  Designate your home as a nonsmoking area.  If you are a heavy smoker, ask your health care provider about a prescription for nicotine chewing gum. It can ease your withdrawal from nicotine.  Reward yourself. Set aside the cigarette money you save and buy yourself something nice.  Look for support from others. Join a support group or smoking cessation program. Ask someone at home or at work to help you with your plan to  quit smoking.  Always ask yourself, "Do I need this cigarette or is this just a reflex?" Tell yourself, "Today, I choose not to smoke," or "I do not want to smoke." You are reminding yourself of your decision to quit.  Do not replace cigarette smoking with electronic cigarettes (commonly called e-cigarettes). The safety of e-cigarettes is unknown, and some may contain harmful chemicals.  If you relapse, do not give up! Plan ahead and think about what you will do the next time you get the urge to smoke. HOW WILL I FEEL WHEN I QUIT SMOKING? You may have symptoms of withdrawal because your body is used to nicotine (the addictive substance in cigarettes). You may crave cigarettes, be irritable, feel very hungry, cough often, get headaches, or have difficulty concentrating. The withdrawal symptoms are only temporary. They are strongest when you first quit but will go away within 10-14 days. When withdrawal symptoms occur, stay in control. Think about your reasons for quitting. Remind yourself that these are signs that your body is healing and getting used to being without cigarettes. Remember that withdrawal symptoms are easier to treat than the major diseases that smoking can cause.  Even  after the withdrawal is over, expect periodic urges to smoke. However, these cravings are generally short lived and will go away whether you smoke or not. Do not smoke! WHAT RESOURCES ARE AVAILABLE TO HELP ME QUIT SMOKING? Your health care provider can direct you to community resources or hospitals for support, which may include:  Group support.  Education.  Hypnosis.  Therapy.   This information is not intended to replace advice given to you by your health care provider. Make sure you discuss any questions you have with your health care provider.   Document Released: 03/07/2004 Document Revised: 06/30/2014 Document Reviewed: 11/25/2012 Elsevier Interactive Patient Education [employer].  Menopause is a normal process in which your reproductive ability comes to an end. This process happens gradually over a span of months to years, usually between the ages of 26 and 46. Menopause is complete when you have missed 12 consecutive menstrual periods. It is important to talk with your health care provider about some of the most common conditions that affect postmenopausal women, such as heart disease, cancer, and bone loss (osteoporosis). Adopting a healthy lifestyle and getting preventive care can help to promote your health and wellness. Those actions can also lower your chances of developing some of these common conditions. WHAT SHOULD I KNOW ABOUT MENOPAUSE? During menopause, you may experience a number of symptoms, such as:  Moderate-to-severe hot flashes.  Night sweats.  Decrease in sex drive.  Mood swings.  Headaches.  Tiredness.  Irritability.  Memory problems.  Insomnia. Choosing to treat or not to treat menopausal changes is an individual decision that you make with your health care provider. WHAT SHOULD I KNOW ABOUT HORMONE REPLACEMENT THERAPY AND SUPPLEMENTS? Hormone therapy products are effective for treating symptoms that are associated with menopause, such as  hot flashes and night sweats. Hormone replacement carries certain risks, especially as you become older. If you are thinking about using estrogen or estrogen with progestin treatments, discuss the benefits and risks with your health care provider. WHAT SHOULD I KNOW ABOUT HEART DISEASE AND STROKE? Heart disease, heart attack, and stroke become more likely as you age. This may be due, in part, to the hormonal changes that your body experiences during menopause. These can affect how your body processes dietary fats, triglycerides, and cholesterol. Heart attack  and stroke are both medical emergencies. There are many things that you can do to help prevent heart disease and stroke:  Have your blood pressure checked at least every 1-2 years. High blood pressure causes heart disease and increases the risk of stroke.  If you are 14-75 years old, ask your health care provider if you should take aspirin to prevent a heart attack or a stroke.  Do not use any tobacco products, including cigarettes, chewing tobacco, or electronic cigarettes. If you need help quitting, ask your health care provider.  It is important to eat a healthy diet and maintain a healthy weight.  Be sure to include plenty of vegetables, fruits, low-fat dairy products, and lean protein.  Avoid eating foods that are high in solid fats, added sugars, or salt (sodium).  Get regular exercise. This is one of the most important things that you can do for your health.  Try to exercise for at least 150 minutes each week. The type of exercise that you do should increase your heart rate and make you sweat. This is known as moderate-intensity exercise.  Try to do strengthening exercises at least twice each week. Do these in addition to the moderate-intensity exercise.  Know your numbers.Ask your health care provider to check your cholesterol and your blood glucose. Continue to have your blood tested as directed by your health care  provider. WHAT SHOULD I KNOW ABOUT CANCER SCREENING? There are several types of cancer. Take the following steps to reduce your risk and to catch any cancer development as early as possible. Breast Cancer  Practice breast self-awareness.  This means understanding how your breasts normally appear and feel.  It also means doing regular breast self-exams. Let your health care provider know about any changes, no matter how small.  If you are 44 or older, have a clinician do a breast exam (clinical breast exam or CBE) every year. Depending on your age, family history, and medical history, it may be recommended that you also have a yearly breast X-ray (mammogram).  If you have a family history of breast cancer, talk with your health care provider about genetic screening.  If you are at high risk for breast cancer, talk with your health care provider about having an MRI and a mammogram every year.  Breast cancer (BRCA) gene test is recommended for women who have family members with BRCA-related cancers. Results of the assessment will determine the need for genetic counseling and BRCA1 and for BRCA2 testing. BRCA-related cancers include these types:  Breast. This occurs in males or females.  Ovarian.  Tubal. This may also be called fallopian tube cancer.  Cancer of the abdominal or pelvic lining (peritoneal cancer).  Prostate.  Pancreatic. Cervical, Uterine, and Ovarian Cancer Your health care provider may recommend that you be screened regularly for cancer of the pelvic organs. These include your ovaries, uterus, and vagina. This screening involves a pelvic exam, which includes checking for microscopic changes to the surface of your cervix (Pap test).  For women ages 21-65, health care providers may recommend a pelvic exam and a Pap test every three years. For women ages 17-65, they may recommend the Pap test and pelvic exam, combined with testing for human papilloma virus (HPV), every five  years. Some types of HPV increase your risk of cervical cancer. Testing for HPV may also be done on women of any age who have unclear Pap test results.  Other health care providers may not recommend any screening for  nonpregnant women who are considered low risk for pelvic cancer and have no symptoms. Ask your health care provider if a screening pelvic exam is right for you.  If you have had past treatment for cervical cancer or a condition that could lead to cancer, you need Pap tests and screening for cancer for at least 20 years after your treatment. If Pap tests have been discontinued for you, your risk factors (such as having a new sexual partner) need to be reassessed to determine if you should start having screenings again. Some women have medical problems that increase the chance of getting cervical cancer. In these cases, your health care provider may recommend that you have screening and Pap tests more often.  If you have a family history of uterine cancer or ovarian cancer, talk with your health care provider about genetic screening.  If you have vaginal bleeding after reaching menopause, tell your health care provider.  There are currently no reliable tests available to screen for ovarian cancer. Lung Cancer Lung cancer screening is recommended for adults 88-46 years old who are at high risk for lung cancer because of a history of smoking. A yearly low-dose CT scan of the lungs is recommended if you:  Currently smoke.  Have a history of at least 30 pack-years of smoking and you currently smoke or have quit within the past 15 years. A pack-year is smoking an average of one pack of cigarettes per day for one year. Yearly screening should:  Continue until it has been 15 years since you quit.  Stop if you develop a health problem that would prevent you from having lung cancer treatment. Colorectal Cancer  This type of cancer can be detected and can often be prevented.  Routine  colorectal cancer screening usually begins at age 37 and continues through age 66.  If you have risk factors for colon cancer, your health care provider may recommend that you be screened at an earlier age.  If you have a family history of colorectal cancer, talk with your health care provider about genetic screening.  Your health care provider may also recommend using home test kits to check for hidden blood in your stool.  A small camera at the end of a tube can be used to examine your colon directly (sigmoidoscopy or colonoscopy). This is done to check for the earliest forms of colorectal cancer.  Direct examination of the colon should be repeated every 5-10 years until age 32. However, if early forms of precancerous polyps or small growths are found or if you have a family history or genetic risk for colorectal cancer, you may need to be screened more often. Skin Cancer  Check your skin from head to toe regularly.  Monitor any moles. Be sure to tell your health care provider:  About any new moles or changes in moles, especially if there is a change in a mole's shape or color.  If you have a mole that is larger than the size of a pencil eraser.  If any of your family members has a history of skin cancer, especially at a young age, talk with your health care provider about genetic screening.  Always use sunscreen. Apply sunscreen liberally and repeatedly throughout the day.  Whenever you are outside, protect yourself by wearing long sleeves, pants, a wide-brimmed hat, and sunglasses. WHAT SHOULD I KNOW ABOUT OSTEOPOROSIS? Osteoporosis is a condition in which bone destruction happens more quickly than new bone creation. After menopause, you may be  at an increased risk for osteoporosis. To help prevent osteoporosis or the bone fractures that can happen because of osteoporosis, the following is recommended:  If you are 59-75 years old, get at least 1,000 mg of calcium and at least 600 mg  of vitamin D per day.  If you are older than age 18 but younger than age 34, get at least 1,200 mg of calcium and at least 600 mg of vitamin D per day.  If you are older than age 41, get at least 1,200 mg of calcium and at least 800 mg of vitamin D per day. Smoking and excessive alcohol intake increase the risk of osteoporosis. Eat foods that are rich in calcium and vitamin D, and do weight-bearing exercises several times each week as directed by your health care provider. WHAT SHOULD I KNOW ABOUT HOW MENOPAUSE AFFECTS [place]? Depression may occur at any age, but it is more common as you become older. Common symptoms of depression include:  Low or sad mood.  Changes in sleep patterns.  Changes in appetite or eating patterns.  Feeling an overall lack of motivation or enjoyment of activities that you previously enjoyed.  Frequent crying spells. Talk with your health care provider if you think that you are experiencing depression. WHAT SHOULD I KNOW ABOUT IMMUNIZATIONS? It is important that you get and maintain your immunizations. These include:  Tetanus, diphtheria, and pertussis (Tdap) booster vaccine.  Influenza every year before the flu season begins.  Pneumonia vaccine.  Shingles vaccine. Your health care provider may also recommend other immunizations.   This information is not intended to replace advice given to you by your health care provider. Make sure you discuss any questions you have with your health care provider.   Document Released: 08/01/2005 Document Revised: 06/30/2014 Document Reviewed: 02/09/2014 Elsevier Interactive Patient Education [employer].

## 2015-10-05 ENCOUNTER — Other Ambulatory Visit: Payer: Self-pay | Admitting: Family Medicine

## 2015-10-05 DIAGNOSIS — Z1231 Encounter for screening mammogram for malignant neoplasm of breast: Secondary | ICD-10-CM

## 2015-10-05 LAB — PAP IG AND HPV HIGH-RISK: HPV DNA HIGH RISK: NOT DETECTED

## 2015-10-08 ENCOUNTER — Encounter: Payer: Self-pay | Admitting: Family Medicine

## 2015-10-08 ENCOUNTER — Encounter: Payer: Self-pay | Admitting: Internal Medicine

## 2015-10-08 MED ORDER — PRAVASTATIN SODIUM 20 MG PO TABS: 20.0000 mg | ORAL_TABLET | Freq: Every day | ORAL | Status: DC

## 2015-10-10 NOTE — Patient Instructions (Signed)
Increase Lantus dose by 1-2 units every 3 days until FBSs are less than 130 Increase/decrease Humalog dose before means that have more/less carbs by 1-2units.   Walk for 30 min. 4-5 days/wk.

## 2015-10-10 NOTE — Progress Notes (Signed)
Patient is taking Lantus 12u and Novolog:3/3/4. Since increasing her Lantus on 4/17 Her FBSs have come down: 85, 100 159 176, 276, 220.  2hr. pcB all over 200-383.  AcL: 208-260.  AcS: 82-259.   We reviewed how to adjust the lantus dose q 3 days and she reported good understanding of this.  Written instructions were again given to her for this. We also discussed the need to take more/less Humalog when eating more/less carbs with her meals.  We reviewed what carbs were and she understands that her 2hr. pc readings werre higher after the meals that she consumed more carbs. We discussed the need to take 1-2 extra units of Humalog for those meals, but caution was given that this would make her gain more weight.  She reported good understanding of this and had no final questions.

## 2015-10-24 ENCOUNTER — Ambulatory Visit
Admission: RE | Admit: 2015-10-24 | Discharge: 2015-10-24 | Disposition: A | Discharge status: Home / Self Care | Payer: BLUE CROSS/BLUE SHIELD | Source: Ambulatory Visit | Attending: Family Medicine | Admitting: Family Medicine

## 2015-10-24 DIAGNOSIS — Z1231 Encounter for screening mammogram for malignant neoplasm of breast: Secondary | ICD-10-CM | POA: Diagnosis not present

## 2015-10-26 ENCOUNTER — Other Ambulatory Visit: Payer: Self-pay | Admitting: Family Medicine

## 2015-10-26 DIAGNOSIS — R928 Other abnormal and inconclusive findings on diagnostic imaging of breast: Secondary | ICD-10-CM

## 2015-11-05 ENCOUNTER — Ambulatory Visit (INDEPENDENT_AMBULATORY_CARE_PROVIDER_SITE_OTHER): Payer: BLUE CROSS/BLUE SHIELD | Admitting: Endocrinology

## 2015-11-05 ENCOUNTER — Encounter: Payer: Self-pay | Admitting: Endocrinology

## 2015-11-05 ENCOUNTER — Ambulatory Visit
Admission: RE | Admit: 2015-11-05 | Discharge: 2015-11-05 | Disposition: A | Discharge status: Home / Self Care | Payer: BLUE CROSS/BLUE SHIELD | Source: Ambulatory Visit | Attending: Family Medicine | Admitting: Family Medicine

## 2015-11-05 VITALS — BP 104/68 | HR 86 | Temp 98.5°F | Resp 14 | Ht 62.5 in | Wt 126.0 lb

## 2015-11-05 DIAGNOSIS — R928 Other abnormal and inconclusive findings on diagnostic imaging of breast: Secondary | ICD-10-CM | POA: Diagnosis not present

## 2015-11-05 DIAGNOSIS — E1065 Type 1 diabetes mellitus with hyperglycemia: Secondary | ICD-10-CM

## 2015-11-05 MED ORDER — INSULIN DEGLUDEC 100 UNIT/ML ~~LOC~~ SOPN: 15.0000 [IU] | PEN_INJECTOR | Freq: Every day | SUBCUTANEOUS | Status: DC

## 2015-11-05 NOTE — Patient Instructions (Signed)
Take 4 Novolog at [facility] and lunch; 8 at dinner

## 2015-11-05 NOTE — Progress Notes (Signed)
Patient ID: Felicia Gardner, female   DOB: 22-Feb-1961, 55 y.o.   MRN: [id number]           Reason for Appointment : Follow-up for Type 1 Diabetes  History of Present Illness          Diagnosis: Type 1 diabetes mellitus, date of diagnosis: 09/03/15        Diabetes  history:    She had presented to the emergency room with weight loss, increased thirst and urination and had marked hyperglycemia  Labs in the emergency room showed >80 ketones in the urine and CO2 down to 15  On her initial consultation she was started on mealtime insulin with NovoLog and Lantus dose was increased to 12 units    INSULIN regimen is: Lantus 13 units in the evening daily. Novolog 6 ac tid  Current blood sugar patterns, management and problems:  As instructed she has reduced her Lantus by 2 units on the last visit when she was having readings as low as 83  Again she is taking Novolog with a relatively fixed dose and not familiar with carbohydrate counting  Recent blood sugars to excellent ratings between morning and evening but they are frequently going up higher after evening meal.  This is despite usually eating one serving of carbohydrate only and a balanced meal usually without added fat  She is trying to use fairly consistent meals with some protein and now is not eating any oatmeal in the morning  With this her blood sugars are beginning to get low normal or low midday and afternoon: Lowest blood sugar 46 yesterday after breakfast without any increase physical activity or change in diet  Postprandial readings after lunch are also generally below 100 recently including a reading of 64  Recently most of her readings late in the evening are high but also has had a relatively high reading right before supper 2 weekends ago  She is not doing any programmed exercise  Her weight has come up slightly but below her original weight  Glucose monitoring:  is being done 1 times a day         Glucometer: One Touch  ultra 2 .      Blood Glucose readings from monitor download  Mean values apply above for all meters except median for One Touch  PRE-MEAL Fasting Lunch Dinner Bedtime Overall  Glucose range: 89-152  46-160  125, 175  68-221    Mean/median: 102  96   155 111+/-42    POST-MEAL PC Breakfast PC Lunch PC Dinner  Glucose range:  64-195    Mean/median:  91       Self-care: The diet that the patient has been following is: generally low fat and moderate in carbohydrates  She is eating Mostly toast/egg; dinner at 6 pm           CDE consultation:   Wt Readings from Last 3 Encounters:  11/05/15 126 lb (57.153 kg)  10/04/15 122 lb 9.6 oz (55.611 kg)  09/24/15 125 lb 9.6 oz (56.972 kg)   Diabetes labs:  Lab Results  Component Value Date   HGBA1C >14.0 09/03/2015   Lab Results  Component Value Date   MICROALBUR 0.6 10/04/2015   LDLCALC 158* 10/04/2015   CREATININE 0.57 10/04/2015    Lab Results  Component Value Date   MICRALBCREAT 4 10/04/2015       Medication List       This list is accurate as of: 11/05/15  1:43  PM.  Always use your most recent med list.               Alpha Lipoic Acid 200 MG Caps  Take by mouth.     blood glucose meter kit and supplies Kit  Dispense based on patient and insurance preference. Use up to four times daily as directed. E11.9     Chromium Picolinate 1000 MCG Tabs  Take by mouth.     Cinnamon 500 MG capsule  Take 500 mg by mouth daily.     Insulin Degludec 100 UNIT/ML Sopn  Commonly known as:  TRESIBA FLEXTOUCH  Inject 15 Units into the skin daily.     insulin lispro 100 UNIT/ML KiwkPen  Commonly known as:  HUMALOG KWIKPEN  6 units before each meal, adjustment as directed     Insulin Pen Needle 32G X 4 MM Misc  Use 4 per day to inject Insulin     LUTEIN 20 PO  Take by mouth.     multivitamin with minerals Tabs tablet  Take 1 tablet by mouth daily.     ONE TOUCH ULTRA TEST test strip  Generic drug:  glucose blood      ONETOUCH DELICA LANCETS FINE Misc     pravastatin 20 MG tablet  Commonly known as:  PRAVACHOL  Take 1 tablet (20 mg total) by mouth daily.     vitamin B-12 1000 MCG tablet  Commonly known as:  CYANOCOBALAMIN  Take 1,000 mcg by mouth daily.     VITAMIN D-3 PO  Take by mouth.        Allergies: No Known Allergies  Past Medical History  Diagnosis Date  . Diabetes mellitus without complication ([place]) 01/9210    diagnosed    Past Surgical History  Procedure Laterality Date  . Bunionectomy  2011  . Tubal ligation  1990    Family History  Problem Relation Age of Onset  . Heart disease Mother   . Graves' disease Mother   . Thyroid disease Sister   . Thyroid disease Maternal Grandmother   . Diabetes Neg Hx   . Hypertension Father   . Crohn's disease Father   . Rheum arthritis Father     Social History:  reports that she has been smoking Cigarettes.  She has a 41 pack-year smoking history. She does not have any smokeless tobacco history on file. She reports that she does not drink alcohol or use illicit drugs.    Review of Systems       Lipids: She has been started on pravastatin recently  Lab Results  Component Value Date   CHOL 250* 10/04/2015   HDL 77 10/04/2015   LDLCALC 158* 10/04/2015   TRIG 73 10/04/2015   CHOLHDL 3.2 10/04/2015        Thyroid:  She does Complain of feeling more cold easily.  Does have fatigue.   She was evaluated for her thyroid because of fatigue and weight loss when her sugars were high  Lab Results  Component Value Date   TSH 4.39 10/04/2015   TSH 4.88* 09/03/2015        Physical Examination:  BP 104/68 mmHg  Pulse 86  Temp(Src) 98.5 F (36.9 C)  Resp 14  Ht 5' 2.5" (1.588 m)  Wt 126 lb (57.153 kg)  BMI 22.66 kg/m2  SpO2 94%        ASSESSMENT:  Diabetes type 1,   See history of present illness for detailed discussion of current diabetes management,  blood sugar patterns and problems identified Her blood sugars  are overall well controlled with home average 119, checking 4 times a day She has fairly stable readings during the day but now tending to be low normal or low after breakfast and lunch Blood sugars are mostly higher after evening meal but not clear if this is starting to go up before her evening meal from waning action of Lantus or the rise in glucose after evening meal She is taking Lantus in the evening Currently taking the same dose of mealtime insulin with all 3 meals and likely is getting excessive doses at breakfast and lunch  She appears to be doing fairly well with balanced meals and no excessive carbohydrate or fat usually  PLAN:    Take 4 units Novolog for breakfast and lunch  coverage unless eating a larger meal  May take 2-3 units if eating a large carbohydrate snack in the afternoon at work  Most likely needs to take 8 units of Novolog at suppertime unless eating a smaller meal with small amount of carbohydrate  Change Lantus to [place], discussed how this is different and may start with the same dose for now  Also periodically check readings right before suppertime  Consider carbohydrate counting to help adjust mealtime doses  Check thyroid levels on the next visit    Patient Instructions  Take 4 Novolog at [facility] and lunch; 8 at dinner      Counseling time on subjects discussed above is over 50% of today's 25 minute visit     Felicia Gardner 11/05/2015, 1:43 PM   Note: This note was prepared with Dragon voice recognition system technology. Any transcriptional errors that result from this process are unintentional.

## 2015-11-07 ENCOUNTER — Other Ambulatory Visit: Payer: Self-pay | Admitting: *Deleted

## 2015-11-07 ENCOUNTER — Telehealth: Payer: Self-pay | Admitting: Endocrinology

## 2015-11-07 MED ORDER — INSULIN PEN NEEDLE 32G X 5 MM MISC: Status: AC

## 2015-11-07 NOTE — Telephone Encounter (Signed)
Patient need a refill for prescription for novo nordisk pen needles, send to  [id number] [address], [place] [phone] (Phone) [phone] (Fax)

## 2015-11-07 NOTE — Telephone Encounter (Signed)
rx sent

## 2015-11-13 ENCOUNTER — Ambulatory Visit (AMBULATORY_SURGERY_CENTER): Payer: Self-pay

## 2015-11-13 VITALS — Ht 62.0 in | Wt 126.2 lb

## 2015-11-13 DIAGNOSIS — Z1211 Encounter for screening for malignant neoplasm of colon: Secondary | ICD-10-CM

## 2015-11-13 MED ORDER — NA SULFATE-K SULFATE-MG SULF 17.5-3.13-1.6 GM/177ML PO SOLN: ORAL | Status: DC

## 2015-11-13 NOTE — Progress Notes (Signed)
Per pt, no allergies to soy or egg products.Pt not taking any weight loss meds or using  O2 at home. 

## 2015-11-20 ENCOUNTER — Encounter: Payer: Self-pay | Admitting: Internal Medicine

## 2015-11-27 ENCOUNTER — Encounter: Payer: Self-pay | Admitting: Internal Medicine

## 2015-11-27 ENCOUNTER — Ambulatory Visit (AMBULATORY_SURGERY_CENTER): Payer: BLUE CROSS/BLUE SHIELD | Admitting: Internal Medicine

## 2015-11-27 VITALS — BP 107/77 | HR 70 | Temp 98.0°F | Resp 18 | Ht 62.0 in | Wt 126.0 lb

## 2015-11-27 DIAGNOSIS — D127 Benign neoplasm of rectosigmoid junction: Secondary | ICD-10-CM

## 2015-11-27 DIAGNOSIS — Z86010 Personal history of colonic polyps: Secondary | ICD-10-CM | POA: Diagnosis present

## 2015-11-27 DIAGNOSIS — Z1211 Encounter for screening for malignant neoplasm of colon: Secondary | ICD-10-CM | POA: Diagnosis not present

## 2015-11-27 DIAGNOSIS — K635 Polyp of colon: Secondary | ICD-10-CM

## 2015-11-27 MED ORDER — SODIUM CHLORIDE 0.9 % IV SOLN: 500.0000 mL | INTRAVENOUS | Status: DC

## 2015-11-27 NOTE — Op Note (Signed)
[place] Patient Name: Jaime Dome Procedure Date: 11/27/2015 8:43 AM MRN: [id number] Endoscopist: Jerene Bears , MD Age: 55 Referring MD:  Date of Birth: [birth year] Gender: Female Procedure:                Colonoscopy Indications:              Screening for colorectal malignant neoplasm, This                            is the patient's first colonoscopy Medicines:                Monitored Anesthesia Care Procedure:                Pre-Anesthesia Assessment:                           - Prior to the procedure, a History and Physical                            was performed, and patient medications and                            allergies were reviewed. The patient's tolerance of                            previous anesthesia was also reviewed. The risks                            and benefits of the procedure and the sedation                            options and risks were discussed with the patient.                            All questions were answered, and informed consent                            was obtained. Prior Anticoagulants: The patient has                            taken no previous anticoagulant or antiplatelet                            agents. ASA Grade Assessment: II - A patient with                            mild systemic disease. After reviewing the risks                            and benefits, the patient was deemed in                            satisfactory condition to undergo the procedure.  After obtaining informed consent, the colonoscope                            was passed under direct vision. Throughout the                            procedure, the patient's blood pressure, pulse, and                            oxygen saturations were monitored continuously. The                            Model PCF-H190L [id number]) scope was introduced                            through the anus and advanced to the the cecum,                             identified by appendiceal orifice and ileocecal                            valve. The colonoscopy was performed without                            difficulty. The patient tolerated the procedure                            well. The quality of the bowel preparation was                            good. The ileocecal valve, appendiceal orifice, and                            rectum were photographed. Scope In: 8:46:36 AM Scope Out: 9:03:24 AM Scope Withdrawal Time: 0 hours 13 minutes 11 seconds  Total Procedure Duration: 0 hours 16 minutes 48 seconds  Findings:                 The digital rectal exam was normal.                           Two sessile polyps were found in the recto-sigmoid                            colon. The polyps were 4 to 5 mm in size. These                            polyps were removed with a cold snare. Resection                            and retrieval were complete.                           A few small-mouthed diverticula were found in the  sigmoid colon.                           Anal papillae were hypertrophied.                           No additional abnormalities were found on                            retroflexion. Complications:            No immediate complications. Estimated Blood Loss:     Estimated blood loss: none. Impression:               - Two 4 to 5 mm polyps at the recto-sigmoid colon,                            removed with a cold snare. Resected and retrieved.                           - Diverticulosis in the sigmoid colon.                           - Anal papillae were hypertrophied. Recommendation:           - The patient will be observed post-procedure,                            until all discharge criteria are met.                           - Patient has a contact number available for                            emergencies. The signs and symptoms of potential                             delayed complications were discussed with the                            patient. Return to normal activities tomorrow.                            Written discharge instructions were provided to the                            patient.                           - Resume previous diet.                           - Continue present medications.                           - Await pathology results.                           -  Repeat colonoscopy is recommended. The                            colonoscopy date will be determined after pathology                            results from today's exam become available for                            review. Jerene Bears, MD 11/27/2015 9:07:56 AM This report has been signed electronically.

## 2015-11-27 NOTE — Progress Notes (Signed)
Called to room to assist during endoscopic procedure.  Patient ID and intended procedure confirmed with present staff. Received instructions for my participation in the procedure from the performing physician.  

## 2015-11-27 NOTE — Progress Notes (Signed)
To Pacu  Awake and alert, Report to RN

## 2015-11-27 NOTE — Patient Instructions (Signed)
YOU HAD AN ENDOSCOPIC PROCEDURE TODAY AT THE [facility]:   Refer to the procedure report that was given to you for any specific questions about what was found during the examination.  If the procedure report does not answer your questions, please call your gastroenterologist to clarify.  If you requested that your care partner not be given the details of your procedure findings, then the procedure report has been included in a sealed envelope for you to review at your convenience later.  YOU SHOULD EXPECT: Some feelings of bloating in the abdomen. Passage of more gas than usual.  Walking can help get rid of the air that was put into your GI tract during the procedure and reduce the bloating. If you had a lower endoscopy (such as a colonoscopy or flexible sigmoidoscopy) you may notice spotting of blood in your stool or on the toilet paper. If you underwent a bowel prep for your procedure, you may not have a normal bowel movement for a few days.  Please Note:  You might notice some irritation and congestion in your nose or some drainage.  This is from the oxygen used during your procedure.  There is no need for concern and it should clear up in a day or so.  SYMPTOMS TO REPORT IMMEDIATELY:   Following lower endoscopy (colonoscopy or flexible sigmoidoscopy):  Excessive amounts of blood in the stool  Significant tenderness or worsening of abdominal pains  Swelling of the abdomen that is new, acute  Fever of 100F or higher    For urgent or emergent issues, a gastroenterologist can be reached at any hour by calling [phone].   DIET: Your first meal following the procedure should be a small meal and then it is ok to progress to your normal diet. Heavy or fried foods are harder to digest and may make you feel nauseous or bloated.  Likewise, meals heavy in dairy and vegetables can increase bloating.  Drink plenty of fluids but you should avoid alcoholic beverages for 24  hours.  ACTIVITY:  You should plan to take it easy for the rest of today and you should NOT DRIVE or use heavy machinery until tomorrow (because of the sedation medicines used during the test).    FOLLOW UP: Our staff will call the number listed on your records the next business day following your procedure to check on you and address any questions or concerns that you may have regarding the information given to you following your procedure. If we do not reach you, we will leave a message.  However, if you are feeling well and you are not experiencing any problems, there is no need to return our call.  We will assume that you have returned to your regular daily activities without incident.  If any biopsies were taken you will be contacted by phone or by letter within the next 1-3 weeks.  Please call us at [phone] if you have not heard about the biopsies in 3 weeks.    SIGNATURES/CONFIDENTIALITY: You and/or your care partner have signed paperwork which will be entered into your electronic medical record.  These signatures attest to the fact that that the information above on your After Visit Summary has been reviewed and is understood.  Full responsibility of the confidentiality of this discharge information lies with you and/or your care-partner.   Resume medications. Information given on polyps,diverticulosis and high fiber diet. 

## 2015-11-28 ENCOUNTER — Telehealth: Payer: Self-pay

## 2015-11-28 NOTE — Telephone Encounter (Signed)
  Follow up Call-  Call back number 11/27/2015  Post procedure Call Back phone  # [phone]  Permission to leave phone message Yes     Patient questions:  Do you have a fever, pain , or abdominal swelling? No. Pain Score  0 *  Have you tolerated food without any problems? Yes.    Have you been able to return to your normal activities? Yes.    Do you have any questions about your discharge instructions: Diet   No. Medications  No. Follow up visit  No.  Do you have questions or concerns about your Care? No.  Actions: * If pain score is 4 or above: No action needed, pain <4.

## 2015-12-04 ENCOUNTER — Encounter: Payer: Self-pay | Admitting: Internal Medicine

## 2015-12-05 ENCOUNTER — Encounter: Payer: Self-pay | Admitting: *Deleted

## 2015-12-05 ENCOUNTER — Encounter: Payer: BLUE CROSS/BLUE SHIELD | Attending: Endocrinology | Admitting: *Deleted

## 2015-12-05 VITALS — Ht 62.0 in | Wt 127.8 lb

## 2015-12-05 DIAGNOSIS — E108 Type 1 diabetes mellitus with unspecified complications: Secondary | ICD-10-CM

## 2015-12-05 DIAGNOSIS — E1065 Type 1 diabetes mellitus with hyperglycemia: Secondary | ICD-10-CM | POA: Insufficient documentation

## 2015-12-05 DIAGNOSIS — IMO0002 Reserved for concepts with insufficient information to code with codable children: Secondary | ICD-10-CM

## 2015-12-05 NOTE — Patient Instructions (Signed)
Plan:  Based on your current eating habits and insulin doses, consider taking 2 unit of Humalog for every 2 Carb Choices (= 2 units for every 15 grams of carbohydrate) Include protein in moderation with your meals and snacks Consider reading food labels for Total Carbohydrate of foods Continue checking BG at alternate times per day as directed by MD  Continue taking medication as directed by MD We can consider discussing your activity level at a future visit

## 2015-12-11 NOTE — Progress Notes (Signed)
Diabetes Self-Management Education  Visit Type: First/Initial  Appt. Start Time: 1530 Appt. End Time: 1700  12/11/2015  [name], identified by name and date of birth, is a 55 y.o. female with a diagnosis of Diabetes: Type 1.   ASSESSMENT  Height 5\' 2"  (1.575 m), weight 127 lb 12.8 oz (57.97 kg). Body mass index is 23.37 kg/(m^2).      Diabetes Self-Management Education - 12/05/15 1537    Visit Information   Visit Type First/Initial   Initial Visit   Diabetes Type Type 1   Are you currently following a meal plan? No   Are you taking your medications as prescribed? Yes   Date Diagnosed 08/2015   Health Coping   How would you rate your overall health? Good   Psychosocial Assessment   Patient Belief/Attitude about Diabetes Motivated to manage diabetes   Self-care barriers None   Other persons present Patient   Patient Concerns Nutrition/Meal planning;Glycemic Control   Special Needs None   Preferred Learning Style Hands on;Auditory;Visual   Learning Readiness Ready   What is the last grade level you completed in school? some college   Pre-Education Assessment   Patient understands the diabetes disease and treatment process. Needs Instruction   Patient understands incorporating nutritional management into lifestyle. Needs Instruction   Patient undertands incorporating physical activity into lifestyle. Needs Instruction   Patient understands using medications safely. Needs Instruction   Patient understands monitoring blood glucose, interpreting and using results Needs Instruction   Patient understands prevention, detection, and treatment of acute complications. Needs Instruction   Patient understands prevention, detection, and treatment of chronic complications. Needs Instruction   Patient understands how to develop strategies to address psychosocial issues. Needs Review   Complications   Last HgB A1C per patient/outside source 14 %   How often do you check your blood  sugar? 3-4 times/day   Fasting Blood glucose range (mg/dL) 70-129   Postprandial Blood glucose range (mg/dL) 70-129   Number of hypoglycemic episodes per month 3   Have you had a dilated eye exam in the past 12 months? No   Have you had a dental exam in the past 12 months? No   Are you checking your feet? Yes   How many days per week are you checking your feet? 7   Dietary Intake   Breakfast 2 eggs, whole wheat english muffin or wheat toast OR graham crackers with PNB   Snack (morning) occasionally mixed nuts   Lunch brings from home: left overs, sandwich or salad and pita bread   Snack (afternoon) infrequently mixed nuts or fresh fruit   Dinner eats at home usually; meat, starch, and vegetables,    Snack (evening) sugar free pudding OR greek yogurt with fruit and cinnamon   Beverage(s) coffee with creamer and Truvia, sparkling water, unsweetened tea   Exercise   Exercise Type ADL's   How many days per week to you exercise? 0   How many minutes per day do you exercise? 0   Total minutes per week of exercise 0   Patient Education   Previous Diabetes Education Yes (please comment)  09/2015   Disease state  Definition of diabetes, type 1 and 2, and the diagnosis of diabetes   Nutrition management  Role of diet in the treatment of diabetes and the relationship between the three main macronutrients and blood glucose level;Food label reading, portion sizes and measuring food.;Carbohydrate counting   Physical activity and exercise  Role of exercise  on diabetes management, blood pressure control and cardiac health.   Medications Reviewed patients medication for diabetes, action, purpose, timing of dose and side effects.   Monitoring Purpose and frequency of SMBG.;Identified appropriate SMBG and/or A1C goals.   Acute complications Taught treatment of hypoglycemia - the 15 rule.   Chronic complications Relationship between chronic complications and blood glucose control   Psychosocial adjustment  Role of stress on diabetes   Individualized Goals (developed by patient)   Nutrition Follow meal plan discussed   Physical Activity Exercise 3-5 times per week   Medications take my medication as prescribed   Monitoring  test my blood glucose as discussed;test blood glucose pre and post meals as discussed   Post-Education Assessment   Patient understands the diabetes disease and treatment process. Demonstrates understanding / competency   Patient understands incorporating nutritional management into lifestyle. Demonstrates understanding / competency   Patient undertands incorporating physical activity into lifestyle. Needs Review   Patient understands using medications safely. Demonstrates understanding / competency   Patient understands monitoring blood glucose, interpreting and using results Demonstrates understanding / competency   Patient understands prevention, detection, and treatment of acute complications. Demonstrates understanding / competency   Patient understands prevention, detection, and treatment of chronic complications. Demonstrates understanding / competency   Outcomes   Expected Outcomes Demonstrated interest in learning. Expect positive outcomes   Future DMSE PRN   Program Status Completed      Individualized Plan for Diabetes Self-Management Training:   Learning Objective:  Patient will have a greater understanding of diabetes self-management. Patient education plan is to attend individual and/or group sessions per assessed needs and concerns.   Plan:   Patient Instructions  Plan:  Based on your current eating habits and insulin doses, consider taking 2 unit of Humalog for every 2 Carb Choices (= 2 units for every 15 grams of carbohydrate) Include protein in moderation with your meals and snacks Consider reading food labels for Total Carbohydrate of foods Continue checking BG at alternate times per day as directed by MD  Continue taking medication as directed by  MD We can consider discussing your activity level at a future visit      Expected Outcomes:  Demonstrated interest in learning. Expect positive outcomes  Education material provided: Living Well with Diabetes, A1C conversion sheet, Meal plan card, Support group flyer and Carbohydrate counting sheet  If problems or questions, patient to contact team via:  Phone and Email  Future DSME appointment: PRN

## 2015-12-13 ENCOUNTER — Other Ambulatory Visit (INDEPENDENT_AMBULATORY_CARE_PROVIDER_SITE_OTHER): Payer: BLUE CROSS/BLUE SHIELD

## 2015-12-13 DIAGNOSIS — E1065 Type 1 diabetes mellitus with hyperglycemia: Secondary | ICD-10-CM

## 2015-12-13 LAB — LIPID PANEL
CHOL/HDL RATIO: 3
CHOLESTEROL: 200 mg/dL (ref 0–200)
HDL: 73.6 mg/dL (ref >39.00)
LDL Cholesterol: 111 mg/dL — ABNORMAL HIGH (ref 0–99)
NonHDL: 126.41
TRIGLYCERIDES: 76 mg/dL (ref 0.0–149.0)
VLDL: 15.2 mg/dL (ref 0.0–40.0)

## 2015-12-13 LAB — HEMOGLOBIN A1C: Hgb A1c MFr Bld: 5.9 % (ref 4.6–6.5)

## 2015-12-13 LAB — COMPREHENSIVE METABOLIC PANEL
ALBUMIN: 4 g/dL (ref 3.5–5.2)
ALT: 18 U/L (ref 0–35)
AST: 23 U/L (ref 0–37)
Alkaline Phosphatase: 69 U/L (ref 39–117)
BILIRUBIN TOTAL: 0.2 mg/dL (ref 0.2–1.2)
BUN: 19 mg/dL (ref 6–23)
CALCIUM: 9.8 mg/dL (ref 8.4–10.5)
CO2: 36 meq/L — AB (ref 19–32)
CREATININE: 0.66 mg/dL (ref 0.40–1.20)
Chloride: 104 mEq/L (ref 96–112)
GFR: 98.28 mL/min (ref >60.00)
Glucose, Bld: 62 mg/dL — ABNORMAL LOW (ref 70–99)
Potassium: 4.3 mEq/L (ref 3.5–5.1)
SODIUM: 142 meq/L (ref 135–145)
Total Protein: 6.4 g/dL (ref 6.0–8.3)

## 2015-12-17 ENCOUNTER — Ambulatory Visit (INDEPENDENT_AMBULATORY_CARE_PROVIDER_SITE_OTHER): Payer: BLUE CROSS/BLUE SHIELD | Admitting: Endocrinology

## 2015-12-17 ENCOUNTER — Encounter: Payer: Self-pay | Admitting: Endocrinology

## 2015-12-17 VITALS — BP 110/60 | HR 74 | Ht 62.5 in | Wt 127.0 lb

## 2015-12-17 DIAGNOSIS — R7989 Other specified abnormal findings of blood chemistry: Secondary | ICD-10-CM

## 2015-12-17 DIAGNOSIS — E109 Type 1 diabetes mellitus without complications: Secondary | ICD-10-CM

## 2015-12-17 NOTE — Progress Notes (Signed)
Patient ID: Felicia Gardner, female   DOB: 1961/01/25, 55 y.o.   MRN: [id number]           Reason for Appointment : Follow-up for Type 1 Diabetes  History of Present Illness          Diagnosis: Type 1 diabetes mellitus, date of diagnosis: 09/03/15        Diabetes  history:    She presented to the emergency room with weight loss, increased thirst and urination and had marked hyperglycemia  Labs in the emergency room showed >80 ketones in the urine and CO2 down to 15 On her initial consultation she was started on mealtime insulin with NovoLog and Lantus dose was increased to 12 units  RECENT history:    INSULIN regimen is: [place] 15 units in the evening daily. Novolog 1U /7.5  Her A1c is now 5.9  Current blood sugar patterns, management and problems:  As instructed she has switched from Lantus to [place], previously was having relatively high readings after evening meal and some variability including readings as low as 46  FASTING blood sugars are now excellent and averaging below 100 without any overnight hypoglycemia except once about 3 weeks ago  She is very compliant with checking her blood sugars 4 times a day  AVERAGE blood sugar throughout the day are fairly consistent and postprandial readings are under 130  She is not doing any programmed exercise what is relatively more active between about 10 AM-2 PM  Instructions from their diabetes educator she is now counting carbohydrates and is using approximately 2 units per 15 g of carbohydrates at all meals  Although her diet is usually low fat she had the highest reading of 258 after eating a hamburger even with covering her carbohydrates appropriately  LOWEST blood sugars appear to be midday and afternoon and most postprandial readings after lunch are relatively low without hypoglycemia except about 2-3 weeks ago  She may occasionally feels a little hypoglycemic and lunch but is trying to get of midmorning protein  snack  Glucose monitoring:  is being done 1 times a day         Glucometer: One Touch ultra 2 .      Blood Glucose readings from monitor download  Mean values apply above for all meters except median for One Touch  PRE-MEAL Fasting Lunch Afternoon  Bedtime Overall  Glucose range: 71-139  70-140  59-198  65-258  59-258  Mean/median: 92  96  106  120  110    Self-care: The diet that the patient has been following is: generally low fat and moderate in carbohydrates  She is eating Mostly toast/egg; dinner at 6 pm           CDE consultation:   Wt Readings from Last 3 Encounters:  12/17/15 127 lb (57.607 kg)  12/05/15 127 lb 12.8 oz (57.97 kg)  11/27/15 126 lb (57.153 kg)   Diabetes labs:  Lab Results  Component Value Date   HGBA1C 5.9 12/13/2015   HGBA1C >14.0 09/03/2015   Lab Results  Component Value Date   MICROALBUR 0.6 10/04/2015   LDLCALC 111* 12/13/2015   CREATININE 0.66 12/13/2015    Lab Results  Component Value Date   MICRALBCREAT 4 10/04/2015       Medication List       This list is accurate as of: 12/17/15  9:37 PM.  Always use your most recent med list.  Alpha Lipoic Acid 200 MG Caps  Take by mouth daily.     blood glucose meter kit and supplies Kit  Dispense based on patient and insurance preference. Use up to four times daily as directed. E11.9     Chromium Picolinate 1000 MCG Tabs  Take by mouth.     Cinnamon 500 MG capsule  Take 500 mg by mouth daily.     Insulin Degludec 100 UNIT/ML Sopn  Commonly known as:  TRESIBA FLEXTOUCH  Inject 15 Units into the skin daily.     insulin lispro 100 UNIT/ML KiwkPen  Commonly known as:  HUMALOG KWIKPEN  6 units before each meal, adjustment as directed     Insulin Pen Needle 32G X 5 MM Misc  Use 4 per day to inject Insulin     LUTEIN 20 PO  Take by mouth.     multivitamin with minerals Tabs tablet  Take 1 tablet by mouth daily.     ONE TOUCH ULTRA TEST test strip  Generic drug:   glucose blood     ONETOUCH DELICA LANCETS FINE Misc     pravastatin 20 MG tablet  Commonly known as:  PRAVACHOL  Take 1 tablet (20 mg total) by mouth daily.     vitamin B-12 1000 MCG tablet  Commonly known as:  CYANOCOBALAMIN  Take 1,000 mcg by mouth daily.     VITAMIN D-3 PO  Take by mouth daily.        Allergies: No Known Allergies  Past Medical History  Diagnosis Date  . Diabetes mellitus without complication ([place]) 06/3242    diagnosed    Past Surgical History  Procedure Laterality Date  . Bunionectomy  2011    right foot  . Tubal ligation  1990    Family History  Problem Relation Age of Onset  . Heart disease Mother   . Graves' disease Mother   . Thyroid disease Sister   . Thyroid disease Maternal Grandmother   . Diabetes Neg Hx   . Hypertension Father   . Crohn's disease Father   . Rheum arthritis Father   . Thyroid disease Sister     Social History:  reports that she has been smoking Cigarettes.  She has a 41 pack-year smoking history. She has never used smokeless tobacco. She reports that she does not drink alcohol or use illicit drugs.    Review of Systems       Lipids: She has been started on pravastatin By PCP  Lab Results  Component Value Date   CHOL 200 12/13/2015   HDL 73.60 12/13/2015   LDLCALC 111* 12/13/2015   TRIG 76.0 12/13/2015   CHOLHDL 3 12/13/2015        Thyroid:  She was evaluated for her thyroid because of fatigue and weight loss when her sugars were high Has not been on any thyroid supplement, TSH upper normal    Lab Results  Component Value Date   TSH 4.39 10/04/2015   TSH 4.88* 09/03/2015        Physical Examination:  BP 110/60 mmHg  Pulse 74  Ht 5' 2.5" (1.588 m)  Wt 127 lb (57.607 kg)  BMI 22.84 kg/m2  SpO2 92%        ASSESSMENT:  Diabetes type 1,   See history of present illness for detailed discussion of current diabetes management, blood sugar patterns and problems identified Her blood sugars  are overall well controlled with A1c 5.9 Her variability is less and blood sugars  are not getting higher in the evenings with switching to [place]  She is doing fairly well with carbohydrate counting recently and covering her meals as directed However occasionally has relatively high readings with higher fat meals Tends to be having low normal readings more recently either fasting or before or after lunch Discussed mealtime insulin coverage based on carbohydrates as well as adjustment for activity and fat intake  PLAN:    Take 14 units of Tresiba instead of 15  Probably needs adding 1-2 units for higher fat meals  Keep the same carbohydrate coverage as she is comfortable doing this but reduce the lunch dose by 1 unit been working to compensate for increased activity  May start cutting back on frequent glucose monitoring including fasting  Follow-up with PCP regarding hypercholesterolemia  Check thyroid levels on the next visit    Patient Instructions  Tresiba 14 units daily  Reduce lunch dose by 1 unit, extra 1-2 u for hi fat meals      Counseling time on subjects discussed above is over 50% of today's 25 minute visit     Kenon Delashmit 12/17/2015, 9:37 PM   Note: This note was prepared with Dragon voice recognition system technology. Any transcriptional errors that result from this process are unintentional.

## 2015-12-17 NOTE — Patient Instructions (Signed)
Tresiba 14 units daily  Reduce lunch dose by 1 unit, extra 1-2 u for hi fat meals

## 2015-12-18 ENCOUNTER — Other Ambulatory Visit: Payer: Self-pay | Admitting: Endocrinology

## 2016-03-13 ENCOUNTER — Other Ambulatory Visit (INDEPENDENT_AMBULATORY_CARE_PROVIDER_SITE_OTHER): Payer: BLUE CROSS/BLUE SHIELD

## 2016-03-13 DIAGNOSIS — E109 Type 1 diabetes mellitus without complications: Secondary | ICD-10-CM | POA: Diagnosis not present

## 2016-03-13 DIAGNOSIS — R7989 Other specified abnormal findings of blood chemistry: Secondary | ICD-10-CM

## 2016-03-13 LAB — T4, FREE: FREE T4: 0.58 ng/dL — AB (ref 0.60–1.60)

## 2016-03-13 LAB — HEMOGLOBIN A1C: Hgb A1c MFr Bld: 6.4 % (ref 4.6–6.5)

## 2016-03-13 LAB — GLUCOSE, RANDOM: GLUCOSE: 122 mg/dL — AB (ref 70–99)

## 2016-03-13 LAB — TSH: TSH: 1.4 u[IU]/mL (ref 0.35–4.50)

## 2016-03-18 ENCOUNTER — Encounter: Payer: Self-pay | Admitting: Endocrinology

## 2016-03-18 ENCOUNTER — Ambulatory Visit (INDEPENDENT_AMBULATORY_CARE_PROVIDER_SITE_OTHER): Payer: BLUE CROSS/BLUE SHIELD | Admitting: Endocrinology

## 2016-03-18 VITALS — BP 116/72 | HR 78 | Temp 98.3°F | Resp 14 | Ht 62.5 in | Wt 131.8 lb

## 2016-03-18 DIAGNOSIS — E1065 Type 1 diabetes mellitus with hyperglycemia: Secondary | ICD-10-CM

## 2016-03-18 NOTE — Progress Notes (Signed)
Patient ID: Felicia Gardner, female   DOB: August 11, 1960, 55 y.o.   MRN: [id number]           Reason for Appointment : Follow-up for Type 1 Diabetes  History of Present Illness          Diagnosis: Type 1 diabetes mellitus, date of diagnosis: 09/03/15        Diabetes  history:    She presented to the emergency room with weight loss, increased thirst and urination and had marked hyperglycemia  Labs in the emergency room showed >80 ketones in the urine and CO2 down to 15 On her initial consultation she was started on mealtime insulin with NovoLog and Lantus dose was increased to 12 units  RECENT history:    INSULIN regimen is: [place] 12 units in the evening daily. Humalog 1U /7.5 g carbohydrate  Her A1c is now 6.4, previously 5.9  Current blood sugar patterns, management and problems:  She has further reduced her [place] since her last visit reportedly because of lower morning readings  FASTING blood sugars are now slightly above normal fairly consistent usually  She is very compliant with checking her blood sugars 4 + times a day  She is concerned about her blood sugars fluctuating more  She has periodic mild increase in blood sugars after lunch but fairly good on an average  Blood sugars AFTER evening meal are quite variable and sometimes significantly high  Although she is trying to eat a healthy diet she will sometimes eat fatty meats in the evening and will not add any extra insulin for higher fat content; also occasionally will take leftovers from dinner for the next day lunch  AVERAGE blood sugar throughout the day ranges from 121 up to 204, highest late in the evening  HYPOGLYCEMIA has been minimal with only one or 2 unusually low readings after lunch  She is fairly active during the day at work but not doing any programmed exercise  She is expressing an interest in using insulin pump  Glucose monitoring:  is being done 1 times a day         Glucometer: One Touch ultra 2  .      Blood Glucose readings from monitor download as follows Her monitor is 1 hour behind in time  Mean values apply above for all meters except median for One Touch  PRE-MEAL Fasting Lunch Dinner Bedtime Overall  Glucose range: 106-141   102-346  97-321    Mean/median: 123  140   215 138   POST-MEAL PC Breakfast PC Lunch PC Dinner  Glucose range:  62-215    Mean/median:       Self-care: The diet that the patient has been following is: generally low fat and moderate in carbohydrates  She is eating Breakfast at 9 am Mostly toast/egg; dinner at 6 pm           CDE consultation: 3/17 and 6/17  Wt Readings from Last 3 Encounters:  03/18/16 131 lb 12.8 oz (59.8 kg)  12/17/15 127 lb (57.6 kg)  12/05/15 127 lb 12.8 oz (58 kg)   Diabetes labs:  Lab Results  Component Value Date   HGBA1C 6.4 03/13/2016   HGBA1C 5.9 12/13/2015   HGBA1C >14.0 09/03/2015   Lab Results  Component Value Date   MICROALBUR 0.6 10/04/2015   LDLCALC 111 (H) 12/13/2015   CREATININE 0.66 12/13/2015    Lab Results  Component Value Date   MICRALBCREAT 4 10/04/2015  Medication List       Accurate as of 03/18/16  8:19 PM. Always use your most recent med list.          Alpha Lipoic Acid 200 MG Caps Take by mouth daily.   blood glucose meter kit and supplies Kit Dispense based on patient and insurance preference. Use up to four times daily as directed. E11.9   Chromium Picolinate 1000 MCG Tabs Take by mouth.   Cinnamon 500 MG capsule Take 500 mg by mouth daily.   HUMALOG KWIKPEN 100 UNIT/ML KiwkPen Generic drug:  insulin lispro INJECT 6 UNITS BEFORE EACH MEAL DAILY AND ADJUST AS DIRECTED   insulin degludec 100 UNIT/ML Sopn FlexTouch Pen Commonly known as:  TRESIBA FLEXTOUCH Inject 15 Units into the skin daily.   Insulin Pen Needle 32G X 5 MM Misc Use 4 per day to inject Insulin   LUTEIN 20 PO Take by mouth.   multivitamin with minerals Tabs tablet Take 1 tablet by mouth  daily.   ONE TOUCH ULTRA TEST test strip Generic drug:  glucose blood   ONETOUCH DELICA LANCETS FINE Misc   pravastatin 20 MG tablet Commonly known as:  PRAVACHOL Take 1 tablet (20 mg total) by mouth daily.   vitamin B-12 1000 MCG tablet Commonly known as:  CYANOCOBALAMIN Take 1,000 mcg by mouth daily.   VITAMIN D-3 PO Take by mouth daily.       Allergies: No Known Allergies  Past Medical History:  Diagnosis Date  . Diabetes mellitus without complication ([place]) 0/3474   diagnosed    Past Surgical History:  Procedure Laterality Date  . BUNIONECTOMY  2011   right foot  . TUBAL LIGATION  1990    Family History  Problem Relation Age of Onset  . Heart disease Mother   . Graves' disease Mother   . Thyroid disease Sister   . Thyroid disease Maternal Grandmother   . Diabetes Neg Hx   . Hypertension Father   . Crohn's disease Father   . Rheum arthritis Father   . Thyroid disease Sister     Social History:  reports that she has been smoking Cigarettes.  She has a 41.00 pack-year smoking history. She has never used smokeless tobacco. She reports that she does not drink alcohol or use drugs.    Review of Systems       Lipids: She has been Continued on pravastatin By PCP  Lab Results  Component Value Date   CHOL 200 12/13/2015   HDL 73.60 12/13/2015   LDLCALC 111 (H) 12/13/2015   TRIG 76.0 12/13/2015   CHOLHDL 3 12/13/2015        Thyroid:  She was evaluated for her thyroid because of fatigue and weight loss when her sugars were high Has not been on any thyroid supplement And her borderline TSH elevation is now back to normal  Lab Results  Component Value Date   TSH 1.40 03/13/2016   TSH 4.39 10/04/2015   TSH 4.88 (H) 09/03/2015   FREET4 0.58 (L) 03/13/2016        Physical Examination:  BP 116/72   Pulse 78   Temp 98.3 F (36.8 C)   Resp 14   Ht 5' 2.5" (1.588 m)   Wt 131 lb 12.8 oz (59.8 kg)   SpO2 96%   BMI 23.72 kg/m           ASSESSMENT:  Diabetes type 1,   See history of present illness for detailed discussion of current  diabetes management, blood sugar patterns and problems identified Her blood sugars are overall well controlled with A1c 6.4 Blood sugars are overall relatively higher than before but also is having less hypoglycemia than before She is concerned about her blood sugars being high but most of her hyperglycemia is after evening meal Currently doing fairly well with carbohydrate counting  Her blood sugars maybe higher after certain meals because of a higher fat content, this is more often after evening meal when her median blood sugar is over 200 She is not doing any programmed exercise but probably requiring less insulin during the day because of being active at work  Fasting blood sugars are fairly consistent and well controlled with relatively lower dose of Tresiba compared to her last visit  THYROID: Has no further rising TSH and will follow this annually  HYPERLIPIDEMIA: Will need follow-up, last LDL was over 100  PLAN:    Discussed options for future treatment and discussed that an insulin pump may not improve her control at this time with her variability being related to mealtime insulin coverage and her schedule being fairly consistent  She can however benefit from continuous glucose monitoring and discussed benefits of using a system such as DexCom  Given her a flowsheet and diary to keep record of her blood sugars before and after meals and actually food intake for review with diabetes educator  She will need to add around 2 units on average for a higher fat meal especially at suppertime   There are no Patient Instructions on file for this visit.  Counseling time on subjects discussed above is over 50% of today's 25 minute visit     Alysson Geist 03/18/2016, 8:19 PM   Note: This note was prepared with Dragon voice recognition system technology. Any transcriptional errors that  result from this process are unintentional.

## 2016-04-04 ENCOUNTER — Telehealth: Payer: Self-pay | Admitting: Endocrinology

## 2016-04-04 NOTE — Telephone Encounter (Signed)
Pt states that the [facility] form is wrong she test 4 times daily and it needs to read that she is hypoglycemic

## 2016-04-07 NOTE — Telephone Encounter (Signed)
Pt called in and said that the script that was sent back in to [facility] was written incorrectly, she said that it needs to say she tests 4x daily, that she is Hypoglycemic, and that her A1C needs to be between 7-10.

## 2016-04-09 NOTE — Telephone Encounter (Signed)
Pt called in again, she stated that she is just trying to get her pump, [facility] is telling her we are filling out the forms incorrectly and we are telling her we are.  She would really like a call back as soon as you can to speak with you on this issue.

## 2016-04-10 ENCOUNTER — Telehealth: Payer: Self-pay | Admitting: Endocrinology

## 2016-04-10 NOTE — Telephone Encounter (Signed)
IF she calls again please let her know I am working on it

## 2016-04-14 ENCOUNTER — Other Ambulatory Visit: Payer: Self-pay | Admitting: Family Medicine

## 2016-04-15 ENCOUNTER — Other Ambulatory Visit: Payer: Self-pay | Admitting: Family Medicine

## 2016-04-21 NOTE — Telephone Encounter (Signed)
Patient stated [facility] need the correct information in order for her to get her Continuous Glucose Monitor,  it has to say she test 4 x a day and her blood glucose is below 50.

## 2016-04-23 ENCOUNTER — Encounter: Payer: BLUE CROSS/BLUE SHIELD | Attending: Endocrinology | Admitting: Nutrition

## 2016-04-23 DIAGNOSIS — E109 Type 1 diabetes mellitus without complications: Secondary | ICD-10-CM | POA: Diagnosis not present

## 2016-04-23 DIAGNOSIS — E119 Type 2 diabetes mellitus without complications: Secondary | ICD-10-CM | POA: Insufficient documentation

## 2016-04-23 DIAGNOSIS — E1065 Type 1 diabetes mellitus with hyperglycemia: Secondary | ICD-10-CM

## 2016-04-23 DIAGNOSIS — Z713 Dietary counseling and surveillance: Secondary | ICD-10-CM | POA: Insufficient documentation

## 2016-04-23 NOTE — Patient Instructions (Signed)
Take 1-2u more units of insulin when eating higher fat meals, and take this after eating the meal.   Call if have questions about how to set up and use the Dexcom.

## 2016-04-23 NOTE — Progress Notes (Signed)
Pt. Is here today to review diet and blood sugars.  Meter was not able to download.  Printed readings from memory shows no lows, but patient reports that blood sugars are dropping at 2 hours after lunch on work days, 3 days/wk.  She is counting carbs and taking 2u/15 grams of carbs for all meals.  She is eating approx; 45 carbs and taking 6u before lunch.  She was told to drop the lunch time dose by 1u, on work days, but wanted a I/C ratio.  She was told to take 2u for every 18 grams, and if still dropping low, decrease this to 2 units for every 20 grams.  She reported good understanding of this, and had no questions.    The lows appear to be because she is more active at work in the afternoons.   Her diet appears balanced, and is counting carbs correctly.  She sometimes eats higher fat meals and said that if she takes 1-2u more, her blood sugars will drop before bed.  She was told to take the dose immediately after her supper meal, and to start with just 1 extra unit.  If blood sugars are still high in the AM, then increase the dose to 2 units--taking this immediate;u after eating supper.  She agreed to do this.  She had no final questions. She was also having difficulty with getting her Dexcom approved.  The LOM and corrected forms were faxed, and she was given a copy of this paperwork.

## 2016-04-24 ENCOUNTER — Other Ambulatory Visit: Payer: Self-pay

## 2016-05-05 ENCOUNTER — Other Ambulatory Visit: Payer: Self-pay | Admitting: Endocrinology

## 2016-05-05 ENCOUNTER — Other Ambulatory Visit: Payer: Self-pay | Admitting: Family Medicine

## 2016-05-05 NOTE — Telephone Encounter (Signed)
09/2015 last ov and labs

## 2016-05-07 ENCOUNTER — Other Ambulatory Visit: Payer: Self-pay | Admitting: Endocrinology

## 2016-05-11 ENCOUNTER — Other Ambulatory Visit: Payer: Self-pay | Admitting: Family Medicine

## 2016-05-19 ENCOUNTER — Ambulatory Visit: Payer: BLUE CROSS/BLUE SHIELD | Admitting: Endocrinology

## 2016-05-20 ENCOUNTER — Encounter: Payer: Self-pay | Admitting: Endocrinology

## 2016-05-20 ENCOUNTER — Ambulatory Visit (INDEPENDENT_AMBULATORY_CARE_PROVIDER_SITE_OTHER): Payer: BLUE CROSS/BLUE SHIELD | Admitting: Endocrinology

## 2016-05-20 VITALS — BP 108/64 | HR 68 | Ht 63.0 in | Wt 137.0 lb

## 2016-05-20 DIAGNOSIS — E1065 Type 1 diabetes mellitus with hyperglycemia: Secondary | ICD-10-CM | POA: Diagnosis not present

## 2016-05-20 NOTE — Progress Notes (Signed)
Patient ID: Felicia Gardner, female   DOB: February 08, 1961, 55 y.o.   MRN: [id number]           Reason for Appointment : Follow-up for Type 1 Diabetes  History of Present Illness          Diagnosis: Type 1 diabetes mellitus, date of diagnosis: 09/03/15        Diabetes  history:    She presented to the emergency room with weight loss, increased thirst and urination and had marked hyperglycemia  Labs in the emergency room showed >80 ketones in the urine and CO2 down to 15 On her initial consultation she was started on mealtime insulin with NovoLog and Lantus dose was increased to 12 units  RECENT history:    INSULIN regimen is: [place] 12 units in the evening daily. Humalog 1U /7.5 g carbohydrate and 1: 9 at lunch Her A1c is most recently 6.4, previously 5.9  Current blood sugar patterns, management and problems:  FASTING blood sugars are fairly well controlled especially in the last few days but over the long weekend had relatively high readings and also occasionally before that   Tyler Aas was not changed on her last visit  She has been using the continuous glucose monitoring for the last month or so but is still learning about the benefits of this  Although she thinks that occasionally her fingersticks are inconsistent if she does them on 2 different fingers they are generally fairly close.  She is using a lower carbohydrate coverage at lunchtime because of being active in the afternoon at work but may be getting higher readings after lunch on weekends when she is not as active  Does occasionally have high readings after evening meal in the low 200+ range probably from higher fat intake especially on Thanksgiving evening  She however says that she has variable response to the same meal on different days occasionally  She does try to take her insulin before preparing her food and  injecting consistently  HYPOGLYCEMIA has been minimal and although her CGM showed a glucose of 48 last night  she did not have any symptoms and with treatment her blood sugar went up to about 180  Most of the variability in her blood sugars is in the afternoons and late night  Glucose monitoring:  is being done 3-4 times a day         Glucometer: One Touch ultra 2 .      Blood Glucose readings from monitor download as follows  CONTINUOUS GLUCOSE MONITORING RECORD INTERPRETATION    Dates of Recording:   Sensor  summary:  Average blood glucose for the period of recording is 145 with standard deviation 50.  Quality of recording was excellent for the last 14 days 70% of her blood sugars were in the target range, 2% low and 28% high.  She was calibrating 3.6 times per day      Glycemic patterns:  Hyperglycemic episodes Occurred periodically after meals and also late at night Hypoglycemic episodes occurred only once last night at about 2 AM with glucose 48 but not confirmed by fingerstick  Blood sugars are generally on an average within the target range with inconsistent rising blood sugar to about 200 after each of the meals without being consistent. Blood sugars tend to be the lowest right before lunch and fairly consistently without hypoglycemia       Overnight periods:  Blood sugars averaging 150 add midnight and gradually decreased down to about 130 at  6 AM, variability is present until about 2 AM and then more consistent.  As above only once she had a hypoglycemic alarm with glucose 48      Preprandial periods:  Blood sugars on an average at breakfast are about 120-130-close to 100 at 12 noon and averaging about 140 around suppertime      Postprandial periods:  She has variable increase in blood sugars after meals but only occasionally after breakfast, most significantly on Monday morning Blood sugars after lunch are mildly increased generally but were more consistently higher on Saturday and Sunday last weekend reaching about 220 She had higher readings after evening meal significantly only and  about 2-3 occasions, peaking at about 240      Hypoglycemia: Minimal as above       Mean values apply above for all meters except median for One Touch  PRE-MEAL Fasting Lunch Dinner Bedtime Overall  Glucose range: 85-204  79-278   86-272    Mean/median: 138  143  157  156  151+/-54     Self-care: The diet that the patient has been following is: generally low fat and moderate in carbohydrates  She is eating Breakfast at 9 am Mostly toast/egg; dinner at 6 pm           CDE consultation: 3/17 and 6/17  Wt Readings from Last 3 Encounters:  05/20/16 137 lb (62.1 kg)  03/18/16 131 lb 12.8 oz (59.8 kg)  12/17/15 127 lb (57.6 kg)   Diabetes labs:  Lab Results  Component Value Date   HGBA1C 6.4 03/13/2016   HGBA1C 5.9 12/13/2015   HGBA1C >14.0 09/03/2015   Lab Results  Component Value Date   MICROALBUR 0.6 10/04/2015   LDLCALC 111 (H) 12/13/2015   CREATININE 0.66 12/13/2015    Lab Results  Component Value Date   MICRALBCREAT 4 10/04/2015       Medication List       Accurate as of 05/20/16  3:54 PM. Always use your most recent med list.          Alpha Lipoic Acid 200 MG Caps Take by mouth daily.   blood glucose meter kit and supplies Kit Dispense based on patient and insurance preference. Use up to four times daily as directed. E11.9   Chromium Picolinate 1000 MCG Tabs Take by mouth.   Cinnamon 500 MG capsule Take 500 mg by mouth daily.   HUMALOG KWIKPEN 100 UNIT/ML KiwkPen Generic drug:  insulin lispro INJECT 6 UNITS BEFORE EACH MEAL DAILY AND ADJUST AS DIRECTED   HUMALOG KWIKPEN 100 UNIT/ML KiwkPen Generic drug:  insulin lispro INJECT 6 UNITS BEFORE EACH MEAL DAILY AND ADJUST AS DIRECTED   Insulin Pen Needle 32G X 5 MM Misc Use 4 per day to inject Insulin   LUTEIN 20 PO Take by mouth.   multivitamin with minerals Tabs tablet Take 1 tablet by mouth daily.   ONE TOUCH ULTRA TEST test strip Generic drug:  glucose blood USE UP TO 4 TIMES DAILY     ONETOUCH DELICA LANCETS FINE Misc   pravastatin 20 MG tablet Commonly known as:  PRAVACHOL TAKE 1 TABLET BY MOUTH DAILY.   TRESIBA FLEXTOUCH 100 UNIT/ML Sopn FlexTouch Pen Generic drug:  insulin degludec INJECT 15 UNITS INTO THE SKIN DAILY.   vitamin B-12 1000 MCG tablet Commonly known as:  CYANOCOBALAMIN Take 1,000 mcg by mouth daily.   VITAMIN D-3 PO Take by mouth daily.       Allergies: No Known Allergies  Past Medical History:  Diagnosis Date  . Diabetes mellitus without complication ([place]) 02/3809   diagnosed    Past Surgical History:  Procedure Laterality Date  . BUNIONECTOMY  2011   right foot  . TUBAL LIGATION  1990    Family History  Problem Relation Age of Onset  . Heart disease Mother   . Graves' disease Mother   . Thyroid disease Sister   . Thyroid disease Maternal Grandmother   . Diabetes Neg Hx   . Hypertension Father   . Crohn's disease Father   . Rheum arthritis Father   . Thyroid disease Sister     Social History:  reports that she has been smoking Cigarettes.  She has a 41.00 pack-year smoking history. She has never used smokeless tobacco. She reports that she does not drink alcohol or use drugs.    Review of Systems       Lipids: She has been Continued on pravastatin By PCP  Lab Results  Component Value Date   CHOL 200 12/13/2015   HDL 73.60 12/13/2015   LDLCALC 111 (H) 12/13/2015   TRIG 76.0 12/13/2015   CHOLHDL 3 12/13/2015        Thyroid:  She was evaluated for her thyroid because of fatigue and weight loss when her sugars were high Has not been on any thyroid supplement And her borderline TSH elevation is now back to normal  Lab Results  Component Value Date   TSH 1.40 03/13/2016   TSH 4.39 10/04/2015   TSH 4.88 (H) 09/03/2015   FREET4 0.58 (L) 03/13/2016        Physical Examination:  BP 108/64   Pulse 68   Ht 5' 3"  (1.6 m)   Wt 137 lb (62.1 kg)   BMI 24.27 kg/m         ASSESSMENT:  Diabetes type 1,    See history of present illness for detailed discussion of current diabetes management, blood sugar patterns and problems identified  Her blood sugars are overall well controlled with Last A1c 6.4 She has not had any unusual pattern seen on her continuous glucose monitoring more than what she has with her fingersticks She has inconsistent was sent to control at times especially lunch and supper This may be related to variable activity or fat intake However she is able to monitor her postprandial change in blood sugar better with the CGM especially at work  Overnight blood sugars are stable Fasting blood sugars are overall fairly good especially the last few days  HYPERLIPIDEMIA: Will need follow-up, last LDL was over 100  PLAN:    Discussed day-to-day management of her diabetes and coverage of meals  Not clear if she has consistently accurate indication on her CGM for low normal or low readings and recommend that she confirm low normal or low readings with fingersticks for now before treating  She will use the same coverage of 1:7.5 For her lunch on weekends when she is not active otherwise can reduce it at work  She can continue the same dose of [place] as recent fasting readings are just above 101 occasion her early morning readings are close to 80 on the CGM  Encouraged her to consider insulin pump  She will call if she has any unusual blood sugar patterns  She will need to add at least 1- 2 units on average for a higher fat meal especially at suppertime   There are no Patient Instructions on file for this visit.  Counseling  time on subjects discussed above is over 50% of today's 25 minute visit     Cherese Lozano 05/20/2016, 3:54 PM   Note: This note was prepared with Dragon voice recognition system technology. Any transcriptional errors that result from this process are unintentional.

## 2016-05-22 DIAGNOSIS — E109 Type 1 diabetes mellitus without complications: Secondary | ICD-10-CM | POA: Diagnosis not present

## 2016-06-15 ENCOUNTER — Other Ambulatory Visit: Payer: Self-pay | Admitting: Endocrinology

## 2016-06-15 ENCOUNTER — Other Ambulatory Visit: Payer: Self-pay | Admitting: Family Medicine

## 2016-06-26 ENCOUNTER — Telehealth: Payer: Self-pay | Admitting: Endocrinology

## 2016-06-26 NOTE — Telephone Encounter (Signed)
Patient just called stating insurance will cover insulin. Please disregard message.

## 2016-06-26 NOTE — Telephone Encounter (Signed)
Pt called in and said that with the new year and the new insurance they have for prescriptions that insulin will not be fully covered under her insurance and wanted to know if there are any copay cards that will help her out.  She stated that she checked and no alternatives will be covered at an affordable price.

## 2016-07-11 ENCOUNTER — Other Ambulatory Visit: Payer: Self-pay | Admitting: Endocrinology

## 2016-07-15 ENCOUNTER — Other Ambulatory Visit: Payer: Self-pay | Admitting: Family Medicine

## 2016-07-18 ENCOUNTER — Other Ambulatory Visit (INDEPENDENT_AMBULATORY_CARE_PROVIDER_SITE_OTHER): Payer: BLUE CROSS/BLUE SHIELD

## 2016-07-18 DIAGNOSIS — E1065 Type 1 diabetes mellitus with hyperglycemia: Secondary | ICD-10-CM

## 2016-07-18 LAB — LIPID PANEL
CHOLESTEROL: 180 mg/dL (ref 0–200)
HDL: 70.6 mg/dL (ref >39.00)
LDL CALC: 94 mg/dL (ref 0–99)
NonHDL: 109.21
TRIGLYCERIDES: 78 mg/dL (ref 0.0–149.0)
Total CHOL/HDL Ratio: 3
VLDL: 15.6 mg/dL (ref 0.0–40.0)

## 2016-07-18 LAB — HEMOGLOBIN A1C: Hgb A1c MFr Bld: 6.5 % (ref 4.6–6.5)

## 2016-07-18 LAB — GLUCOSE, RANDOM: Glucose, Bld: 129 mg/dL — ABNORMAL HIGH (ref 70–99)

## 2016-07-22 ENCOUNTER — Ambulatory Visit (INDEPENDENT_AMBULATORY_CARE_PROVIDER_SITE_OTHER): Payer: BLUE CROSS/BLUE SHIELD | Admitting: Endocrinology

## 2016-07-22 ENCOUNTER — Encounter: Payer: Self-pay | Admitting: Endocrinology

## 2016-07-22 VITALS — BP 100/68 | HR 68 | Ht 63.0 in | Wt 142.0 lb

## 2016-07-22 DIAGNOSIS — E109 Type 1 diabetes mellitus without complications: Secondary | ICD-10-CM

## 2016-07-22 DIAGNOSIS — R946 Abnormal results of thyroid function studies: Secondary | ICD-10-CM | POA: Diagnosis not present

## 2016-07-22 DIAGNOSIS — E1010 Type 1 diabetes mellitus with ketoacidosis without coma: Secondary | ICD-10-CM | POA: Diagnosis not present

## 2016-07-22 DIAGNOSIS — R7989 Other specified abnormal findings of blood chemistry: Secondary | ICD-10-CM

## 2016-07-22 NOTE — Progress Notes (Signed)
Patient ID: Felicia Gardner, female   DOB: January 12, 1961, 56 y.o.   MRN: [id number]           Reason for Appointment : Follow-up for Type 1 Diabetes  History of Present Illness          Diagnosis: Type 1 diabetes mellitus, date of diagnosis: 09/03/15        Diabetes  history:    She presented to the emergency room with weight loss, increased thirst and urination and had marked hyperglycemia  Labs in the emergency room showed >80 ketones in the urine and CO2 down to 15 On her initial consultation she was started on mealtime insulin with NovoLog and Lantus dose was increased to 12 units  RECENT history:    INSULIN regimen is: [place] 14 units in the evening daily. Humalog 1U /7.5 g carbohydrate and 1: 9 at lunch Her A1c is most recently 6.4, previously 5.9  Current blood sugar patterns evaluated by download of her DexCom sensor, management and problems:  AVERAGE blood sugar for the last 2 weeks is 162 with standard deviation 62  She has 63% blood sugars in target range  FASTING blood sugars are fairly well controlled; she says she did go up on her Tresiba by 2 units for 3 weeks ago  She has not brought her monitor for download and except for this past week her morning readings are generally averaging about 120 and 130.  With her having a respiratory illness this last Thursday she had high readings particularly during the night and on Saturday late evening also with glucose peaking over 300  POSTPRANDIAL blood sugars: She has sporadic high readings after breakfast or lunch but not consistently and blood sugars may go up to over 200.  She thinks that oatmeal tends to raise her blood sugar but if she has eggs with her carbohydrate in the morning she may feel hypoglycemic before lunch although has not had any documented low sugars before lunch  Her best glucose today recently was in 1/17 when blood sugar was in target 78% of the time  She usually does not have significant hyperglycemia  after evening meal  She is still using the same carbohydrate coverage as before  HYPOGLYCEMIA has been minimal and only documented low sugar was 623 on fingerstick but this was reading in the 40s on her CGM after midnight   Self-care: The diet that the patient has been following is: generally low fat and moderate in carbohydrates  She is eating Breakfast at 9 am Mostly toast/egg; dinner at 6 pm           CDE consultation: 3/17 and 6/17  Wt Readings from Last 3 Encounters:  07/22/16 142 lb (64.4 kg)  05/20/16 137 lb (62.1 kg)  03/18/16 131 lb 12.8 oz (59.8 kg)   Diabetes labs:  Lab Results  Component Value Date   HGBA1C 6.5 07/18/2016   HGBA1C 6.4 03/13/2016   HGBA1C 5.9 12/13/2015   Lab Results  Component Value Date   MICROALBUR 0.6 10/04/2015   LDLCALC 94 07/18/2016   CREATININE 0.66 12/13/2015    Lab Results  Component Value Date   MICRALBCREAT 4 10/04/2015     Allergies as of 07/22/2016   No Known Allergies     Medication List       Accurate as of 07/22/16  9:15 PM. Always use your most recent med list.          Alpha Lipoic Acid 200 MG Caps Take by  mouth daily.   blood glucose meter kit and supplies Kit Dispense based on patient and insurance preference. Use up to four times daily as directed. E11.9   Chromium Picolinate 1000 MCG Tabs Take by mouth.   Cinnamon 500 MG capsule Take 500 mg by mouth daily.   HUMALOG KWIKPEN 100 UNIT/ML KiwkPen Generic drug:  insulin lispro INJECT 6 UNITS BEFORE EACH MEAL DAILY AND ADJUST AS DIRECTED   HUMALOG KWIKPEN 100 UNIT/ML KiwkPen Generic drug:  insulin lispro INJECT 6 UNITS BEFORE EACH MEAL DAILY AND ADJUST AS DIRECTED   HUMALOG KWIKPEN 100 UNIT/ML KiwkPen Generic drug:  insulin lispro INJECT 6 UNITS BEFORE EACH MEAL DAILY AND ADJUST AS DIRECTED   Insulin Pen Needle 32G X 5 MM Misc Use 4 per day to inject Insulin   LUTEIN 20 PO Take by mouth.   multivitamin with minerals Tabs tablet Take 1 tablet  by mouth daily.   ONE TOUCH ULTRA TEST test strip Generic drug:  glucose blood USE UP TO 4 TIMES DAILY   ONETOUCH DELICA LANCETS FINE Misc   pravastatin 20 MG tablet Commonly known as:  PRAVACHOL TAKE 1 TABLET BY MOUTH DAILY.   TRESIBA FLEXTOUCH 100 UNIT/ML Sopn FlexTouch Pen Generic drug:  insulin degludec INJECT 15 UNITS INTO THE SKIN DAILY.   vitamin B-12 1000 MCG tablet Commonly known as:  CYANOCOBALAMIN Take 1,000 mcg by mouth daily.   VITAMIN D-3 PO Take by mouth daily.       Allergies: No Known Allergies  Past Medical History:  Diagnosis Date  . Diabetes mellitus without complication ([facility]) 08/2015   diagnosed    Past Surgical History:  Procedure Laterality Date  . BUNIONECTOMY  2011   right foot  . TUBAL LIGATION  1990    Family History  Problem Relation Age of Onset  . Heart disease Mother   . Graves' disease Mother   . Thyroid disease Sister   . Thyroid disease Maternal Grandmother   . Diabetes Neg Hx   . Hypertension Father   . Crohn's disease Father   . Rheum arthritis Father   . Thyroid disease Sister     Social History:  reports that she has been smoking Cigarettes.  She has a 41.00 pack-year smoking history. She has never used smokeless tobacco. She reports that she does not drink alcohol or use drugs.    Review of Systems       Lipids: She has been on pravastatin, Prescribed By PCP Recent LDL is excellent  Lab Results  Component Value Date   CHOL 180 07/18/2016   HDL 70.60 07/18/2016   LDLCALC 94 07/18/2016   TRIG 78.0 07/18/2016   CHOLHDL 3 07/18/2016        Thyroid:  She was evaluated for her thyroid because of fatigue and weight loss when her sugars were high Had a transient increase in TSH in 3/17 Has not been on any thyroid supplement    Lab Results  Component Value Date   TSH 1.40 03/13/2016   TSH 4.39 10/04/2015   TSH 4.88 (H) 09/03/2015   FREET4 0.58 (L) 03/13/2016     Physical Examination:  BP 100/68    Pulse 68   Ht 5' 3" (1.6 m)   Wt 142 lb (64.4 kg)   SpO2 98%   BMI 25.15 kg/m         ASSESSMENT:  Diabetes type 1,   See history of present illness for detailed discussion of current diabetes management, blood sugar patterns   and problems identified She has been managed with basal bolus insulin and is also using the DexCom CGM currently  Her blood sugars are overall well controlled with Last A1c 6.5 and this has been stable Although she has had high readings over the last few days with her viral illness most of her blood sugars are generally well controlled She does have somewhat variable readings after breakfast and lunch even without change in her diet and sometimes She thinks she has tendency to low blood sugars before lunch time despite having protein in the morning possibly from increased activity at work She is generally using lower CARBOHYDRATE coverage at lunchtime since she gets low readings with being more active in the afternoon at work with lunchtime bolus  Overnight blood sugars are usually controlled except when she was having respiratory illness Fasting blood sugars are overall fairly good, she has on her own increased her [place] with good results Hypoglycemia has been minimal and she again may occasionally have falsely low readings on her CGM  HYPERLIPIDEMIA: Will need to continue current treatment unchanged  PLAN:    No change in her insulin doses recommended today  She will continue to monitor her blood sugar patterns with various meals and call if not well controlled  May also need to increase her Tresiba by another 1 unit if fasting readings tend to be persistently high.  She was shown the freestyle [place] system information and discussed how this would be useful and different from [facility].  Patient information was given and she will look into this further  Recommended having a late morning carbohydrate snack before lunch to avoid tendency to low sugars  symptoms  There are no Patient Instructions on file for this visit.  Counseling time on subjects discussed above is over 50% of today's 25 minute visit     Lateefa Crosby 07/22/2016, 9:15 PM   Note: This note was prepared with Estate agent. Any transcriptional errors that result from this process are unintentional.

## 2016-07-23 ENCOUNTER — Telehealth: Payer: Self-pay | Admitting: Endocrinology

## 2016-07-23 NOTE — Telephone Encounter (Signed)
Patient ask you to send prescription Free style [place] to pharmacy    [id number] [address], [place] [phone] (Phone) [phone] (Fax)

## 2016-07-24 NOTE — Telephone Encounter (Signed)
Did verbal order 07/24/16

## 2016-07-29 ENCOUNTER — Telehealth: Payer: Self-pay | Admitting: Endocrinology

## 2016-07-29 MED ORDER — FREESTYLE LIBRE SENSOR SYSTEM MISC: 1.0000 | 2 refills | Status: DC

## 2016-07-29 NOTE — Telephone Encounter (Signed)
Refill submitted. 

## 2016-07-29 NOTE — Telephone Encounter (Signed)
Pt needs the sensors for the [facility] [place] [facility] [address].

## 2016-08-12 NOTE — Telephone Encounter (Signed)
Patient need a 30 day supply  Free style libre test strips  [id number] [address], [place] [phone] (Phone) [phone] (Fax)

## 2016-08-13 ENCOUNTER — Other Ambulatory Visit: Payer: Self-pay | Admitting: Physician Assistant

## 2016-08-13 ENCOUNTER — Other Ambulatory Visit: Payer: Self-pay

## 2016-08-13 NOTE — Telephone Encounter (Signed)
Last OV 10/04/15, needs OV for future refills.

## 2016-08-14 NOTE — Telephone Encounter (Signed)
Ordered verbally

## 2016-08-14 NOTE — Telephone Encounter (Signed)
Per the pharmacy the pt is in need of test strips for her one touch, there are no test strips for the free style libre.

## 2016-08-21 MED ORDER — GLUCOSE BLOOD VI STRP: ORAL_STRIP | 3 refills | Status: DC

## 2016-08-21 NOTE — Telephone Encounter (Signed)
Rx for onetouch test strips [facility] [address]

## 2016-08-25 ENCOUNTER — Telehealth: Payer: Self-pay | Admitting: Endocrinology

## 2016-08-25 ENCOUNTER — Other Ambulatory Visit: Payer: Self-pay

## 2016-08-25 MED ORDER — FREESTYLE LIBRE SENSOR SYSTEM MISC: 1.0000 | 2 refills | Status: DC

## 2016-08-25 NOTE — Telephone Encounter (Signed)
New prescription for the [facility], 1 for 10 days, for 30 day  Send to [employer]

## 2016-08-26 ENCOUNTER — Other Ambulatory Visit: Payer: Self-pay

## 2016-08-26 ENCOUNTER — Other Ambulatory Visit: Payer: Self-pay | Admitting: Endocrinology

## 2016-08-26 ENCOUNTER — Telehealth: Payer: Self-pay | Admitting: Endocrinology

## 2016-08-26 MED ORDER — FREESTYLE LIBRE SENSOR SYSTEM MISC: 1.0000 | 2 refills | Status: DC

## 2016-08-26 NOTE — Telephone Encounter (Signed)
Ordered

## 2016-08-26 NOTE — Telephone Encounter (Signed)
The sensors were called in on 08/25/16

## 2016-08-26 NOTE — Telephone Encounter (Signed)
Pt called and said that the pharmacy told her that we called in the wrong thing in for her Felicia Gardner and that she needs the sensors called in, please advise.

## 2016-09-24 ENCOUNTER — Telehealth: Payer: Self-pay | Admitting: Endocrinology

## 2016-09-24 ENCOUNTER — Other Ambulatory Visit: Payer: Self-pay

## 2016-09-24 MED ORDER — FREESTYLE LIBRE SENSOR SYSTEM MISC: 1.0000 | 2 refills | Status: DC

## 2016-09-24 NOTE — Telephone Encounter (Signed)
Submitted refills. 3 sensors and 2 refills, 3 sensors should be enough for a month supply.

## 2016-09-24 NOTE — Telephone Encounter (Signed)
Pt needs a month's supply of the [place] sensors [facility] [address].  She said the last time the wrong thing was sent in and to please make sure the sensors are sent in.

## 2016-09-28 IMAGING — CR DG CHEST 2V
2 series · 2 of 2 positions shown · non-contrast
Comparison: None.

CLINICAL DATA: Shortness of breath. Heart palpitations. Fatigue and
weight loss.

EXAM:
CHEST  2 VIEW

[PA]
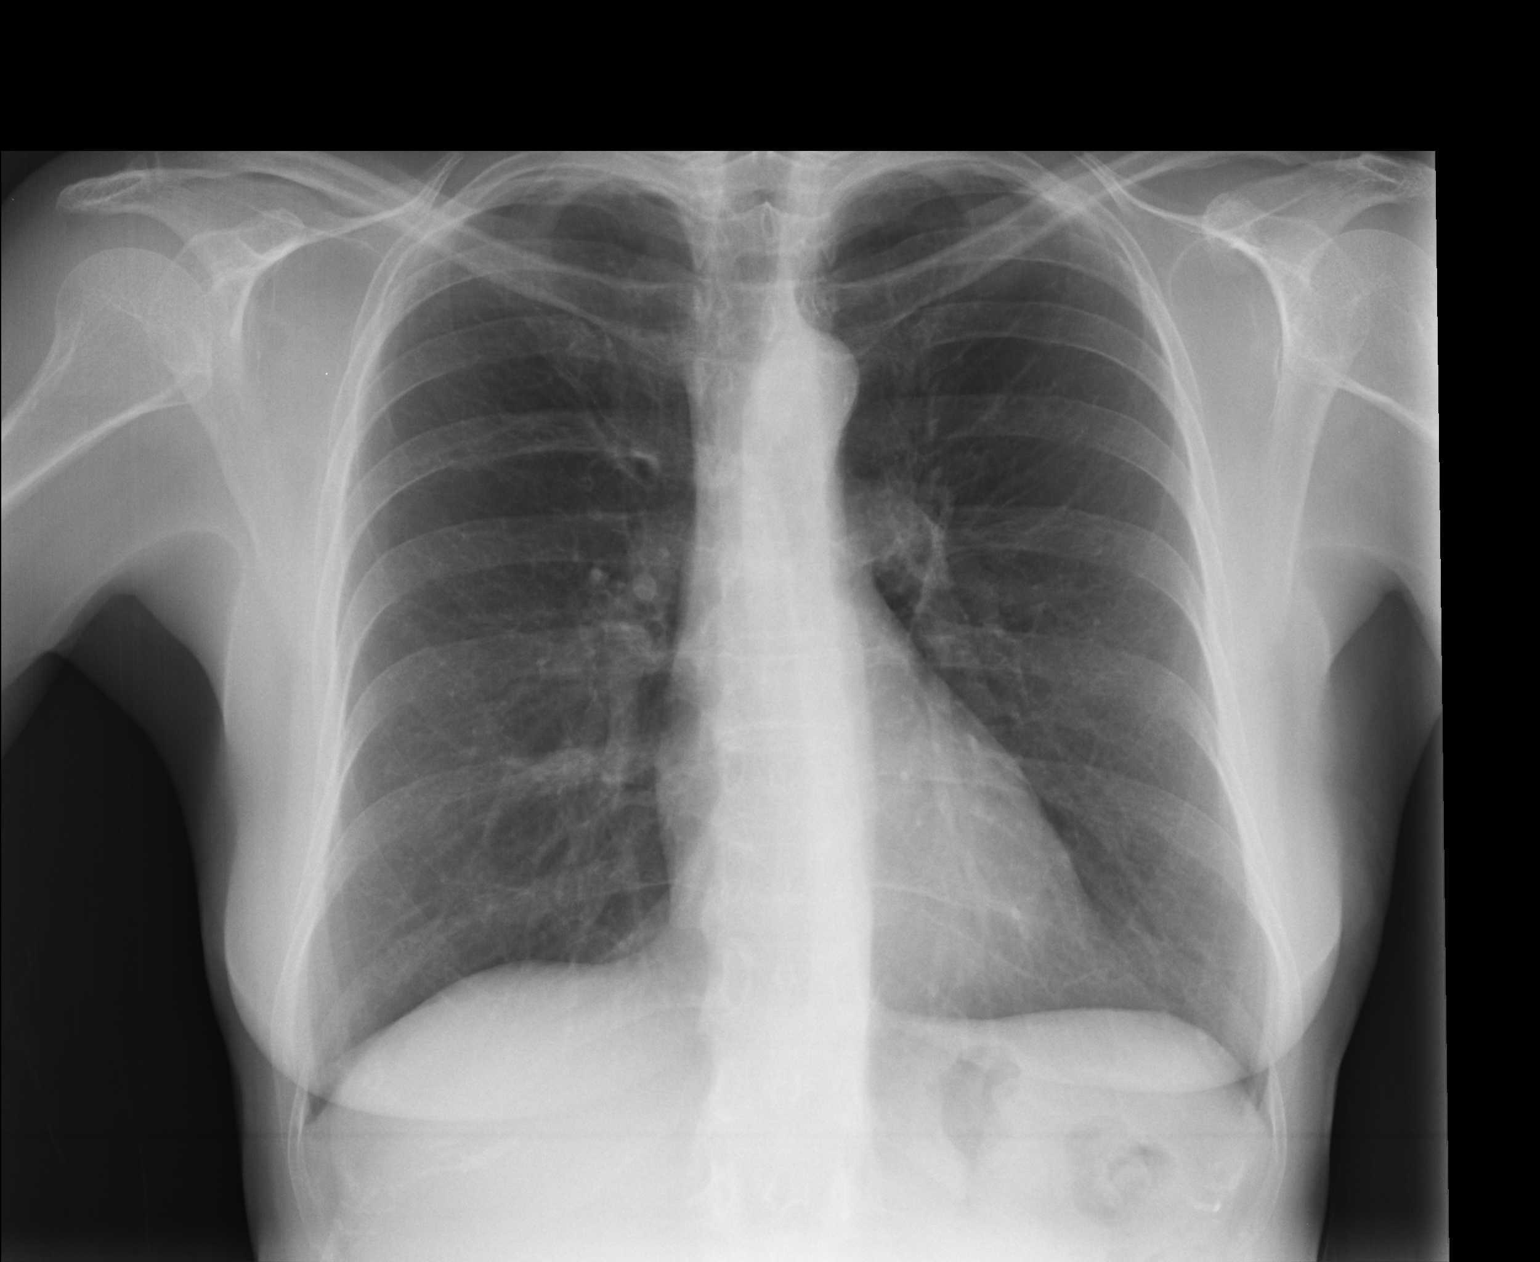

[lateral]
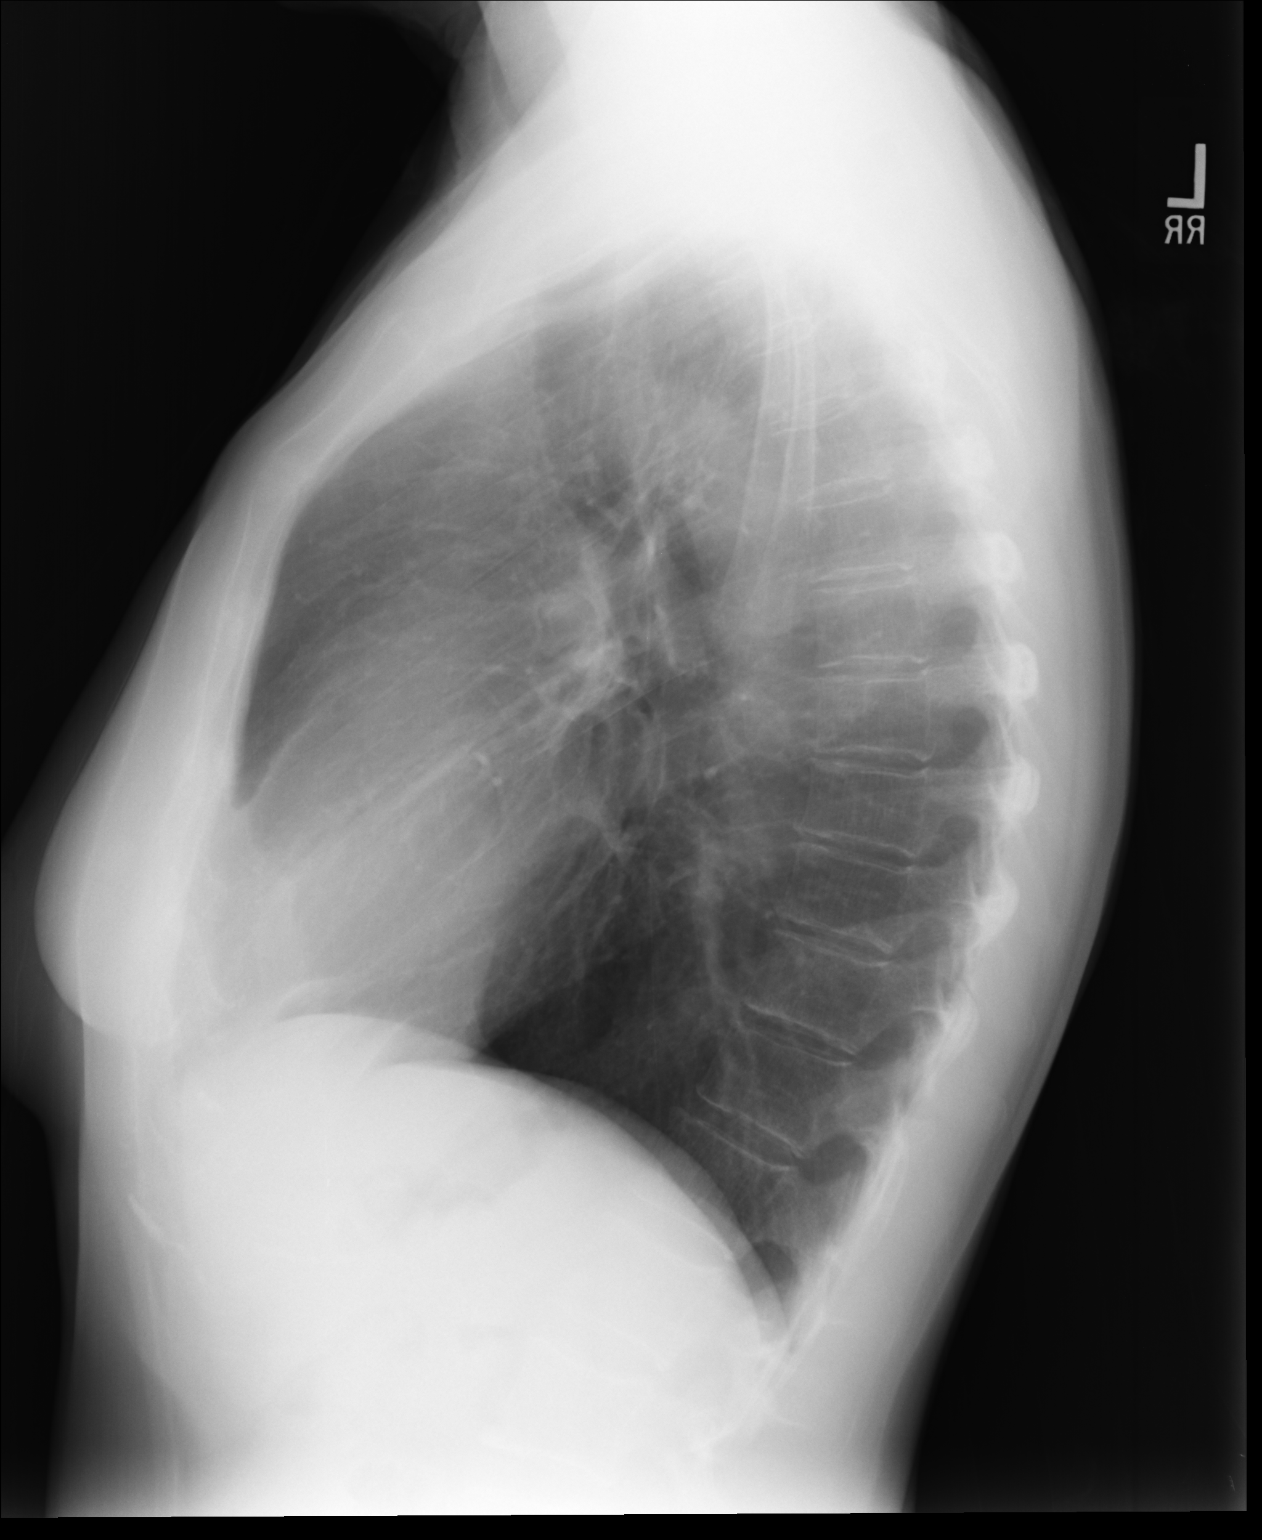

[2 of 2 positions shown; findings below may reference images not displayed]

FINDINGS: Normal cardiac silhouette and mediastinal contours. No focal
parenchymal opacities. No pleural effusion or pneumothorax. No
evidence of edema. No acute osseus abnormalities.
IMPRESSION: No acute cardiopulmonary disease.

## 2016-09-30 ENCOUNTER — Other Ambulatory Visit: Payer: Self-pay | Admitting: Physician Assistant

## 2016-10-03 ENCOUNTER — Other Ambulatory Visit: Payer: Self-pay | Admitting: Physician Assistant

## 2016-10-04 ENCOUNTER — Encounter: Payer: Self-pay | Admitting: Family Medicine

## 2016-10-04 NOTE — Telephone Encounter (Signed)
Need office visit for additional refills

## 2016-11-14 ENCOUNTER — Telehealth: Payer: Self-pay | Admitting: Endocrinology

## 2016-11-14 ENCOUNTER — Other Ambulatory Visit (INDEPENDENT_AMBULATORY_CARE_PROVIDER_SITE_OTHER): Payer: BLUE CROSS/BLUE SHIELD

## 2016-11-14 DIAGNOSIS — E1010 Type 1 diabetes mellitus with ketoacidosis without coma: Secondary | ICD-10-CM

## 2016-11-14 DIAGNOSIS — R946 Abnormal results of thyroid function studies: Secondary | ICD-10-CM | POA: Diagnosis not present

## 2016-11-14 DIAGNOSIS — R7989 Other specified abnormal findings of blood chemistry: Secondary | ICD-10-CM

## 2016-11-14 LAB — MICROALBUMIN / CREATININE URINE RATIO
CREATININE, U: 43.8 mg/dL
MICROALB/CREAT RATIO: 1.6 mg/g (ref 0.0–30.0)
Microalb, Ur: <0.7 mg/dL (ref 0.0–1.9)

## 2016-11-14 LAB — BASIC METABOLIC PANEL
BUN: 13 mg/dL (ref 6–23)
CHLORIDE: 106 meq/L (ref 96–112)
CO2: 29 meq/L (ref 19–32)
CREATININE: 0.7 mg/dL (ref 0.40–1.20)
Calcium: 9.5 mg/dL (ref 8.4–10.5)
GFR: 91.53 mL/min (ref >60.00)
GLUCOSE: 137 mg/dL — AB (ref 70–99)
Potassium: 4.3 mEq/L (ref 3.5–5.1)
Sodium: 140 mEq/L (ref 135–145)

## 2016-11-14 LAB — HEMOGLOBIN A1C: HEMOGLOBIN A1C: 7.2 % — AB (ref 4.6–6.5)

## 2016-11-14 LAB — TSH: TSH: 4.31 u[IU]/mL (ref 0.35–4.50)

## 2016-11-14 MED ORDER — GLUCOSE BLOOD VI STRP: ORAL_STRIP | 2 refills | Status: DC

## 2016-11-14 NOTE — Telephone Encounter (Signed)
Refill submitted per patient's request.  

## 2016-11-14 NOTE — Telephone Encounter (Signed)
[facility] [phone]  [facility] [address]  FreeStyle Blood Glucose Test Strips (Precicion Neo)  Felicia Gardner was in the lab today and she stop by to see if we could send a prescription in for at least a 100 strips. Please call an advise

## 2016-11-19 ENCOUNTER — Ambulatory Visit (INDEPENDENT_AMBULATORY_CARE_PROVIDER_SITE_OTHER): Payer: BLUE CROSS/BLUE SHIELD | Admitting: Endocrinology

## 2016-11-19 ENCOUNTER — Encounter: Payer: Self-pay | Admitting: Endocrinology

## 2016-11-19 VITALS — BP 114/64 | HR 78 | Ht 63.0 in | Wt 143.8 lb

## 2016-11-19 DIAGNOSIS — E039 Hypothyroidism, unspecified: Secondary | ICD-10-CM | POA: Diagnosis not present

## 2016-11-19 DIAGNOSIS — E038 Other specified hypothyroidism: Secondary | ICD-10-CM

## 2016-11-19 DIAGNOSIS — E1065 Type 1 diabetes mellitus with hyperglycemia: Secondary | ICD-10-CM

## 2016-11-19 NOTE — Patient Instructions (Addendum)
Extra 2 units insulin at [facility]

## 2016-11-19 NOTE — Progress Notes (Signed)
Patient ID: Felicia Gardner, female   DOB: 01-05-1961, 56 y.o.   MRN: [id number]           Reason for Appointment : Follow-up for Type 1 Diabetes  History of Present Illness          Diagnosis: Type 1 diabetes mellitus, date of diagnosis: 09/03/15        Diabetes  history:    She presented to the emergency room with weight loss, increased thirst and urination and had marked hyperglycemia  Labs in the emergency room showed >80 ketones in the urine and CO2 down to 15 On her initial consultation she was started on mealtime insulin with NovoLog and Lantus dose was increased to 12 units  RECENT history:    INSULIN regimen is: [place] 12 units in the evening daily. Humalog 1U /7.5 g carbohydrate and 1: 9 at lunch  Her A1c is higher than expected at 7.2, previously 6.5 and has been as low as 5.9  Current blood sugar patterns evaluated by download of her DexCom sensor, management and problems:  She is continuing to use a FreeStyle Libre sensor for her monitoring, she is checking her blood sugar with a fingerstick only if she is doubtful of the sensor reading  She thinks that the sensor may be off by up to 20 mg either higher or lower at times including last night when it indicated her blood sugar was 66 but it was actually 96 on the fingerstick  Recently has used the sensor over 90% of the time  AVERAGE blood sugar for the last 2 weeks is 138 with standard deviation 53 which is better than before  She has 52 % blood sugars in target range  FASTING and overnight blood sugars are fairly well controlled; she is waking up around 5:30-6 AM and blood sugars are in the range of about 90-135 at that time  TRESIBA has been continued at 12 units since her last visit  POSTPRANDIAL readings are significantly high after breakfast on they are averaging about 200, this is mostly with eating peanut butter crackers in the morning but she also thinks that if she does not eat breakfast on weekends blood  sugar will not go up, also the extent of hyperglycemia after breakfast is quite variable  She has a minimal don't know  Post prandial readings after lunch and supper are generally well controlled with occasional meals that are causing higher sugars, mostly after supper  She is still using the same carbohydrate coverage as before  HYPOGLYCEMIA has been minimal clinically although the sensor indicates that 6% of readings below 70  AVERAGES by time of day: 111 overnight, 134, 6 AM-12 noon, 1 3812 noon-6 PM and 129 after 6 PM with overall average 128 for the last 30 days  14 day analysis:  Mean values apply above for all meters except median for One Touch  PRE-MEAL Fasting Lunch Dinner Bedtime Overall  Glucose range:       Mean/median: 119  156  140  129  138    POST-MEAL PC Breakfast PC Lunch PC Dinner  Glucose range:     Average  191       Self-care: The diet that the patient has been following is: generally low fat and moderate in carbohydrates   She is eating Breakfast at 8 am Mostly Peanut butter crackers with coffee; dinner at 6-6:30 pm           CDE consultation: 3/17 and 6/17  Wt  Readings from Last 3 Encounters:  11/19/16 143 lb 12.8 oz (65.2 kg)  07/22/16 142 lb (64.4 kg)  05/20/16 137 lb (62.1 kg)   Diabetes labs:  Lab Results  Component Value Date   HGBA1C 7.2 (H) 11/14/2016   HGBA1C 6.5 07/18/2016   HGBA1C 6.4 03/13/2016   Lab Results  Component Value Date   MICROALBUR <0.7 11/14/2016   LDLCALC 94 07/18/2016   CREATININE 0.70 11/14/2016    Lab Results  Component Value Date   MICRALBCREAT 1.6 11/14/2016   MICRALBCREAT 4 10/04/2015     Allergies as of 11/19/2016   No Known Allergies     Medication List       Accurate as of 11/19/16  8:17 PM. Always use your most recent med list.          Alpha Lipoic Acid 200 MG Caps Take by mouth daily.   blood glucose meter kit and supplies Kit Dispense based on patient and insurance preference. Use up  to four times daily as directed. E11.9   Chromium Picolinate 1000 MCG Tabs Take by mouth.   Cinnamon 500 MG capsule Take 500 mg by mouth daily.   FREESTYLE LIBRE SENSOR SYSTEM Misc 1 Device by Does not apply route as directed. One sensor every ten days   glucose blood test strip Commonly known as:  FREESTYLE PRECISION NEO TEST Use to check blood sugar readings at least 4 times per day.   HUMALOG KWIKPEN 100 UNIT/ML KiwkPen Generic drug:  insulin lispro INJECT 6 UNITS BEFORE EACH MEAL DAILY AND ADJUST AS DIRECTED   Insulin Pen Needle 32G X 5 MM Misc Use 4 per day to inject Insulin   LUTEIN 20 PO Take by mouth.   multivitamin with minerals Tabs tablet Take 1 tablet by mouth daily.   ONETOUCH DELICA LANCETS FINE Misc   pravastatin 20 MG tablet Commonly known as:  PRAVACHOL TAKE 1 TABLET (20 MG TOTAL) BY MOUTH DAILY.   TRESIBA FLEXTOUCH 100 UNIT/ML Sopn FlexTouch Pen Generic drug:  insulin degludec INJECT 15 UNITS INTO THE SKIN DAILY.   vitamin B-12 1000 MCG tablet Commonly known as:  CYANOCOBALAMIN Take 1,000 mcg by mouth daily.   VITAMIN D-3 PO Take by mouth daily.       Allergies: No Known Allergies  Past Medical History:  Diagnosis Date  . Diabetes mellitus without complication ([place]) 02/9832   diagnosed    Past Surgical History:  Procedure Laterality Date  . BUNIONECTOMY  2011   right foot  . TUBAL LIGATION  1990    Family History  Problem Relation Age of Onset  . Heart disease Mother   . Graves' disease Mother   . Thyroid disease Sister   . Thyroid disease Maternal Grandmother   . Diabetes Neg Hx   . Hypertension Father   . Crohn's disease Father   . Rheum arthritis Father   . Thyroid disease Sister     Social History:  reports that she has been smoking Cigarettes.  She has a 41.00 pack-year smoking history. She has never used smokeless tobacco. She reports that she does not drink alcohol or use drugs.    Review of Systems       Lipids:  She has been on pravastatin, Prescribed By PCP Recent LDL is excellent  Lab Results  Component Value Date   CHOL 180 07/18/2016   HDL 70.60 07/18/2016   LDLCALC 94 07/18/2016   TRIG 78.0 07/18/2016   CHOLHDL 3 07/18/2016  Thyroid:  She was evaluated for her thyroid because of fatigue and weight loss   Had a transient increase in TSH in 3/17, back to normal in 9/17 and about the same again now She is not complaining of any unusual fatigue now  Has not been on any thyroid supplement in the past but does have a family history of thyroid disease   Lab Results  Component Value Date   TSH 4.31 11/14/2016   TSH 1.40 03/13/2016   TSH 4.39 10/04/2015   FREET4 0.58 (L) 03/13/2016     Physical Examination:  BP 114/64   Pulse 78   Ht 5' 3"  (1.6 m)   Wt 143 lb 12.8 oz (65.2 kg)   SpO2 95%   BMI 25.47 kg/m         ASSESSMENT:  Diabetes type 1,   See history of present illness for detailed discussion of current diabetes management, blood sugar patterns and problems identified She has been managed with basal bolus insulin and Now using the FreeStyle CGM  Not clear why her A1c is up to 7.2 even though her blood sugars are averaging below 140 at home recently and over the last month Only significant pattern is high readings after breakfast despite not eating a large meal or high glycemic index foods Also not having a significant Dawn phenomenon At sugars after other meals are overall well controlled with some variability based on the type of food She is very consistent with her compliance with insulin doses including the meal insulin Currently she is seeing some inconsistent correlation with the CGM and fingersticks but for the most part CGM is adequate for her insulin adjustment  Not clear if she is using the correction factor, she is basically adding 2 units at lunch if blood sugars are over about 160  Fasting blood sugars are overall fairly good, she has on her own  increased her [place] with good results Hypoglycemia has been minimal and she again may occasionally have falsely low readings on her CGM, rarely a little low before suppertime  Urine microalbumin normal  ?  Hashimoto's thyroiditis: Her TSH is again upper normal but she is asymptomatic  PLAN:    No change in her basic insulin doses recommended today  However she will add 2 units extra for breakfast when she is eating a meal to allow better postprandial control, this should bring her postprandial readings down 60-70 mg on an average  She will continue to monitor her blood sugar patterns with various meals  No change in [place]  Discussed in detail the use of the various insulin pumps and basic principles about the pumps  Discussed that she should try to use a closed loop pump such as Medtronic 670 to provide more convenience and overall better controlled without excessive manual intervention.  Discussed how this would be used in detail  She will continue to monitor blood sugars with the freestyle Libre for now  Patient Instructions  Extra 2 units insulin at Bfst   Counseling time on subjects discussed above is over 50% of today's 25 minute visit     Trentan Trippe 11/19/2016, 8:17 PM   Note: This note was prepared with Dragon voice recognition system technology. Any transcriptional errors that result from this process are unintentional.

## 2016-12-01 DIAGNOSIS — E1065 Type 1 diabetes mellitus with hyperglycemia: Secondary | ICD-10-CM | POA: Diagnosis not present

## 2016-12-09 ENCOUNTER — Encounter: Payer: BLUE CROSS/BLUE SHIELD | Attending: Endocrinology | Admitting: Nutrition

## 2016-12-09 DIAGNOSIS — E109 Type 1 diabetes mellitus without complications: Secondary | ICD-10-CM

## 2016-12-09 NOTE — Progress Notes (Signed)
Pt. Was trained on the Medtronic 670G insulin pump.   She had not read any of the books, and did not do any prepump training.  We discussed the differences between basal and bolus insulin, she was shown the different menues she was shown how to put in the settings of basal rates and bolus calculations into the pump:  Basal rate 0.35: Bolus settings carbohydrate coverage 1:7.5 at breakfast and supper and 1: 12 at lunch ISF: 50 and target 120  She was shown how to give a bolus, and she re demonstrated this X2 without any assistance from me. CGM also started at her request.  She was shown how to calibrate and we linked it to her pump. She was encouraged to test her blood sugar before bolusing despite the CGM, due to better accuracy.

## 2016-12-10 ENCOUNTER — Encounter: Payer: BLUE CROSS/BLUE SHIELD | Admitting: Nutrition

## 2016-12-10 ENCOUNTER — Encounter: Payer: Self-pay | Admitting: Endocrinology

## 2016-12-10 ENCOUNTER — Ambulatory Visit (INDEPENDENT_AMBULATORY_CARE_PROVIDER_SITE_OTHER): Payer: BLUE CROSS/BLUE SHIELD | Admitting: Endocrinology

## 2016-12-10 VITALS — BP 110/70 | HR 73 | Ht 63.0 in | Wt 142.0 lb

## 2016-12-10 DIAGNOSIS — E1065 Type 1 diabetes mellitus with hyperglycemia: Secondary | ICD-10-CM

## 2016-12-10 NOTE — Progress Notes (Signed)
Patient ID: Felicia Gardner, female   DOB: 1961/03/08, 56 y.o.   MRN: [id number]           Reason for Appointment : Follow-up for Type 1 Diabetes  History of Present Illness          Diagnosis: Type 1 diabetes mellitus, date of diagnosis: 09/03/15        Diabetes  history:    She presented to the emergency room with weight loss, increased thirst and urination and had marked hyperglycemia  Labs in the emergency room showed >80 ketones in the urine and CO2 down to 15 On her initial consultation she was started on mealtime insulin with NovoLog and Lantus dose was increased to 12 units  RECENT history:    INSULIN regimen is: Medtronic 670 pump since 12/10/78  Basal rate 0.35: Bolus settings carbohydrate coverage 1:7.5 at breakfast and supper and 1: 12 at lunch Correction 07/03/1948 and target 120  Previous regimen: [place] 12 units in the evening daily. Humalog 1U /7.5 g carbohydrate and 1: 9 at lunch  Her A1c most recently was 7.2, lowest 5.9  is higher than expected at 7.2, previously 6.5 and has been as low as 5.9  Current blood sugar patterns evaluated by download of her pump with Medtronic sensor, management and problems:  She had a spike in her blood sugar after dinner because of forgetting her bolus, due to correction of 8 units for her high blood sugar but also entered 60 g carbohydrate  Subsequently blood sugar went down and basal was suspended by Sensor with glucose 98    Overnight blood sugar was about 150-170 and fasting 148 this morning  Blood sugar however was increasing after 6 AM  Breakfast bolus triggered a suspend when blood sugar was declining but postprandial reading was 128  Blood sugar before and after lunch were 150 and 98    Self-care: The diet that the patient has been following is: generally low fat and moderate in carbohydrates   She is eating Breakfast at 8 am Mostly Peanut butter crackers with coffee; dinner at 6-6:30 pm           CDE  consultation: 3/17 and 6/17  Wt Readings from Last 3 Encounters:  12/10/16 142 lb (64.4 kg)  11/19/16 143 lb 12.8 oz (65.2 kg)  07/22/16 142 lb (64.4 kg)   Diabetes labs:  Lab Results  Component Value Date   HGBA1C 7.2 (H) 11/14/2016   HGBA1C 6.5 07/18/2016   HGBA1C 6.4 03/13/2016   Lab Results  Component Value Date   MICROALBUR <0.7 11/14/2016   LDLCALC 94 07/18/2016   CREATININE 0.70 11/14/2016    Lab Results  Component Value Date   MICRALBCREAT 1.6 11/14/2016   MICRALBCREAT 4 10/04/2015     Allergies as of 12/10/2016   No Known Allergies     Medication List       Accurate as of 12/10/16 11:59 PM. Always use your most recent med list.          Alpha Lipoic Acid 200 MG Caps Take by mouth daily.   blood glucose meter kit and supplies Kit Dispense based on patient and insurance preference. Use up to four times daily as directed. E11.9   Chromium Picolinate 1000 MCG Tabs Take by mouth.   Cinnamon 500 MG capsule Take 500 mg by mouth daily.   FREESTYLE LIBRE SENSOR SYSTEM Misc 1 Device by Does not apply route as directed. One sensor every ten days   glucose  blood test strip Commonly known as:  FREESTYLE PRECISION NEO TEST Use to check blood sugar readings at least 4 times per day.   HUMALOG KWIKPEN 100 UNIT/ML KiwkPen Generic drug:  insulin lispro INJECT 6 UNITS BEFORE EACH MEAL DAILY AND ADJUST AS DIRECTED   Insulin Pen Needle 32G X 5 MM Misc Use 4 per day to inject Insulin   LUTEIN 20 PO Take by mouth.   multivitamin with minerals Tabs tablet Take 1 tablet by mouth daily.   ONETOUCH DELICA LANCETS FINE Misc   pravastatin 20 MG tablet Commonly known as:  PRAVACHOL TAKE 1 TABLET (20 MG TOTAL) BY MOUTH DAILY.   TRESIBA FLEXTOUCH 100 UNIT/ML Sopn FlexTouch Pen Generic drug:  insulin degludec INJECT 15 UNITS INTO THE SKIN DAILY.   vitamin B-12 1000 MCG tablet Commonly known as:  CYANOCOBALAMIN Take 1,000 mcg by mouth daily.   VITAMIN D-3  PO Take by mouth daily.       Allergies: No Known Allergies  Past Medical History:  Diagnosis Date  . Diabetes mellitus without complication ([place]) 12/960   diagnosed    Past Surgical History:  Procedure Laterality Date  . BUNIONECTOMY  2011   right foot  . TUBAL LIGATION  1990    Family History  Problem Relation Age of Onset  . Heart disease Mother   . Graves' disease Mother   . Thyroid disease Sister   . Thyroid disease Maternal Grandmother   . Diabetes Neg Hx   . Hypertension Father   . Crohn's disease Father   . Rheum arthritis Father   . Thyroid disease Sister     Social History:  reports that she has been smoking Cigarettes.  She has a 41.00 pack-year smoking history. She has never used smokeless tobacco. She reports that she does not drink alcohol or use drugs.    Review of Systems  Following is a copy of the previous note:     Lipids: She has been on pravastatin, Prescribed By PCP Recent LDL is excellent  Lab Results  Component Value Date   CHOL 180 07/18/2016   HDL 70.60 07/18/2016   LDLCALC 94 07/18/2016   TRIG 78.0 07/18/2016   CHOLHDL 3 07/18/2016        Thyroid:  She was evaluated for her thyroid because of fatigue and weight loss   Had a transient increase in TSH in 3/17, back to normal in 9/17 and about the same again now She is not complaining of any unusual fatigue now  Has not been on any thyroid supplement in the past but does have a family history of thyroid disease   Lab Results  Component Value Date   TSH 4.31 11/14/2016   TSH 1.40 03/13/2016   TSH 4.39 10/04/2015   FREET4 0.58 (L) 03/13/2016     Physical Examination:  BP 110/70   Pulse 73   Ht 5' 3"  (1.6 m)   Wt 142 lb (64.4 kg)   SpO2 98%   BMI 25.15 kg/m         ASSESSMENT:  Diabetes type 1,   See history of present illness for detailed discussion of current diabetes management, blood sugar patterns and problems identified  With her insulin pump she has had  somewhat variable response in the first day or so However most significant trend's are relatively high sugars overnight with a dawn phenomenon Blood sugars are trending excessively lower with lunchtime bolus but fairly good after breakfast Since she did not bolus on  time and at dinnertime and had extra insulin for blood sugar was low normal subsequently However no hypoglycemia  PLAN:    Her basal rate was programmed to 0.4 until 8 AM  Carbohydrate coverage 1:15 at lunch and 1:8 at other meals  She will try to bolus appropriately before eating  Review her progress again tomorrow There are no Patient Instructions on file for this visit.      Itzamara Casas 12/11/2016, 8:04 AM   Note: This note was prepared with Dragon voice recognition system technology. Any transcriptional errors that result from this process are unintentional.

## 2016-12-11 ENCOUNTER — Encounter: Payer: Self-pay | Admitting: Endocrinology

## 2016-12-11 ENCOUNTER — Ambulatory Visit (INDEPENDENT_AMBULATORY_CARE_PROVIDER_SITE_OTHER): Payer: BLUE CROSS/BLUE SHIELD | Admitting: Endocrinology

## 2016-12-11 VITALS — BP 112/72 | HR 71 | Ht 63.0 in | Wt 142.6 lb

## 2016-12-11 DIAGNOSIS — E1065 Type 1 diabetes mellitus with hyperglycemia: Secondary | ICD-10-CM

## 2016-12-11 NOTE — Progress Notes (Signed)
Patient ID: Felicia Gardner, female   DOB: 10/02/1960, 55 y.o.   MRN: [id number]           Reason for Appointment : Follow-up for Type 1 Diabetes  History of Present Illness          Diagnosis: Type 1 diabetes mellitus, date of diagnosis: 09/03/15        Diabetes  history:    She presented to the emergency room with weight loss, increased thirst and urination and had marked hyperglycemia  Labs in the emergency room showed >80 ketones in the urine and CO2 down to 15 On her initial consultation she was started on mealtime insulin with NovoLog and Lantus dose was increased to 12 units  RECENT history:    INSULIN regimen is: Medtronic 670 pump since 12/10/78  Basal rate midnight = 0.40, 8 AM = 0.35: Bolus settings carbohydrate coverage 1: 8 at breakfast and supper and 1: 15 at lunch Correction 07/03/1948 and target 120   Her A1c most recently was 7.2, lowest 5.9  Current blood sugar patterns evaluated by download of her pump with Medtronic sensor, management and problems:  This is her second day on the pump  Blood sugar patterns since her adjustment yesterday are as follows:  Fasting blood sugar was 183 but she had the pump suspended by Sensor around 3-4 AM  However does appear to have also a rise in blood sugar after 6 AM again; not clear this is from Dawn phenomenon or she is getting a high reading from drinking coffee in the morning  She had a relatively light dinner last evening but her blood sugars started going up after 9 PM  Subsequently blood sugar declined significantly after 3 AM triggering a suspend  Post breakfast blood sugar was mildly increased over the reading before breakfast but declined by noon  She is more active in the morning hours between about 9:30-2 PM and also is generally active at home on the weekends  Again postprandial reading after lunch was only minimally increased despite her reduced coverage for lunch  In the afternoon she had a sweet snack  without a bolus and blood sugar has gone up to 200+  Doing boluses before meals   Self-care: The diet that the patient has been following is: generally low fat and moderate in carbohydrates   She is eating Breakfast at 8 am Mostly Peanut butter crackers with coffee; dinner at 6-6:30 pm           CDE consultation: 3/17 and 6/17  Wt Readings from Last 3 Encounters:  12/11/16 142 lb 9.6 oz (64.7 kg)  12/10/16 142 lb (64.4 kg)  11/19/16 143 lb 12.8 oz (65.2 kg)   Diabetes labs:  Lab Results  Component Value Date   HGBA1C 7.2 (H) 11/14/2016   HGBA1C 6.5 07/18/2016   HGBA1C 6.4 03/13/2016   Lab Results  Component Value Date   MICROALBUR <0.7 11/14/2016   LDLCALC 94 07/18/2016   CREATININE 0.70 11/14/2016    Lab Results  Component Value Date   MICRALBCREAT 1.6 11/14/2016   MICRALBCREAT 4 10/04/2015     Allergies as of 12/11/2016   No Known Allergies     Medication List       Accurate as of 12/11/16 11:59 PM. Always use your most recent med list.          Alpha Lipoic Acid 200 MG Caps Take by mouth daily.   blood glucose meter kit and supplies Kit Dispense based   on patient and insurance preference. Use up to four times daily as directed. E11.9   Chromium Picolinate 1000 MCG Tabs Take by mouth.   Cinnamon 500 MG capsule Take 500 mg by mouth daily.   FREESTYLE LIBRE SENSOR SYSTEM Misc 1 Device by Does not apply route as directed. One sensor every ten days   glucose blood test strip Commonly known as:  FREESTYLE PRECISION NEO TEST Use to check blood sugar readings at least 4 times per day.   HUMALOG KWIKPEN 100 UNIT/ML KiwkPen Generic drug:  insulin lispro INJECT 6 UNITS BEFORE EACH MEAL DAILY AND ADJUST AS DIRECTED   Insulin Pen Needle 32G X 5 MM Misc Use 4 per day to inject Insulin   LUTEIN 20 PO Take by mouth.   multivitamin with minerals Tabs tablet Take 1 tablet by mouth daily.   ONETOUCH DELICA LANCETS FINE Misc   pravastatin 20 MG  tablet Commonly known as:  PRAVACHOL TAKE 1 TABLET (20 MG TOTAL) BY MOUTH DAILY.   TRESIBA FLEXTOUCH 100 UNIT/ML Sopn FlexTouch Pen Generic drug:  insulin degludec INJECT 15 UNITS INTO THE SKIN DAILY.   vitamin B-12 1000 MCG tablet Commonly known as:  CYANOCOBALAMIN Take 1,000 mcg by mouth daily.   VITAMIN D-3 PO Take by mouth daily.       Allergies: No Known Allergies  Past Medical History:  Diagnosis Date  . Diabetes mellitus without complication ([place]) 12/9890   diagnosed    Past Surgical History:  Procedure Laterality Date  . BUNIONECTOMY  2011   right foot  . TUBAL LIGATION  1990    Family History  Problem Relation Age of Onset  . Heart disease Mother   . Graves' disease Mother   . Thyroid disease Sister   . Thyroid disease Maternal Grandmother   . Diabetes Neg Hx   . Hypertension Father   . Crohn's disease Father   . Rheum arthritis Father   . Thyroid disease Sister     Social History:  reports that she has been smoking Cigarettes.  She has a 41.00 pack-year smoking history. She has never used smokeless tobacco. She reports that she does not drink alcohol or use drugs.    Review of Systems  Following is a copy of the previous note:      Lipids: She has been on pravastatin, Prescribed By PCP Recent LDL is excellent  Lab Results  Component Value Date   CHOL 180 07/18/2016   HDL 70.60 07/18/2016   LDLCALC 94 07/18/2016   TRIG 78.0 07/18/2016   CHOLHDL 3 07/18/2016        Thyroid:  She was evaluated for her thyroid because of fatigue and weight loss   Had a transient increase in TSH in 3/17, back to normal in 9/17 and about the same again now She is not complaining of any unusual fatigue now  Has not been on any thyroid supplement in the past but does have a family history of thyroid disease   Lab Results  Component Value Date   TSH 4.31 11/14/2016   TSH 1.40 03/13/2016   TSH 4.39 10/04/2015   FREET4 0.58 (L) 03/13/2016     Physical  Examination:  BP 112/72   Pulse 71   Ht 5' 3" (1.6 m)   Wt 142 lb 9.6 oz (64.7 kg)   SpO2 95%   BMI 25.26 kg/m         ASSESSMENT:  Diabetes type 1,   See history of present illness  for detailed discussion of current diabetes management, blood sugar patterns and problems identified  She is using the 670 pump in the sensor on the manual more Detailed analysis of her pump download were discussed with patient and factors affecting her variability were analyzed in detail  Blood sugars are relatively stable since her visit yesterday with most significant abnormalities being rising blood sugar after 9 PM, tendency to low normal sugars at 3-4 AM and dawn phenomenon Postprandial readings appear to be fairly appropriate  PLAN:    Her basal rate changes were initiated as follows  Midnight = 0.4, 3 AM = 0.35, 6 AM = 0.45, 9 AM = 0.35 and 6 PM = 0.4  She will continue active insulin time of 4 hours  May consider reducing lunchtime carb ratio gain but probably should do better around mealtimes with starting the auto mode  She will call the nurse educator to start the auto mode next week  Also consider using a target of 150 when she is more active  Explained to her that her target should be continued on 120 because this will be similar to her standard pump setting on the auto mode  There are no Patient Instructions on file for this visit.   Total visit time for downloading, review of pump download date, evaluation and management of her day-to-day insulin management and diet = 25 minutes   KUMAR,AJAY 12/12/2016, 4:55 PM   Note: This note was prepared with Dragon voice recognition system technology. Any transcriptional errors that result from this process are unintentional.   

## 2016-12-14 NOTE — Patient Instructions (Signed)
REad booklets thoroughly Call 800 number if questions Follow booklet for reservour changes on Friday.

## 2016-12-14 NOTE — Patient Instructions (Signed)
Read over book 1 on pump therapy, and book 2 on CGM

## 2016-12-14 NOTE — Progress Notes (Signed)
Pt. Reported no difficulty giving bolus at supper, but did not give bolus acL yesterday, and so we reviewed this procedured again.  Pt. Reported that she probably forgot to press deliver button. We reviewed high blood sugar protocols, sick day guidelines, what to bring with her at all times, and temp basal rates--when and how to do this.   She reported good understanding of all topics and was given handouts of all of these.  She reported that she has not had a chance to read over the manuals, and was strongly encouraged to do this ASAP. She signed off the checklist as understanding all topics. After seeing Dr. Dwyane Dee, she was assisted in changing her settings:  Basal rates: MN: 0.4, 3AM: 0.35, 6AM: 0.45, 9AM: 0.35, 6PM: 0.4.   We reviewed again the steps in changing the cartridge/reservour, and she was shown in the booklet where the steps are to follow them when needed on Friday for a change.  She reported good understanding of this.  She will come in in 2 weeks for the auto mode start.

## 2016-12-15 ENCOUNTER — Telehealth: Payer: Self-pay | Admitting: Endocrinology

## 2016-12-15 DIAGNOSIS — E1065 Type 1 diabetes mellitus with hyperglycemia: Secondary | ICD-10-CM | POA: Diagnosis not present

## 2016-12-15 DIAGNOSIS — Z794 Long term (current) use of insulin: Secondary | ICD-10-CM | POA: Diagnosis not present

## 2016-12-15 NOTE — Telephone Encounter (Signed)
Called patient and let her know that everything I see around July 6th is completely booked. I advised for her to call back tomorrow to see if anything may come available. Or if someone can put her on the cancellation list for the week of July 6th so she can come in that would be great!

## 2016-12-15 NOTE — Telephone Encounter (Signed)
Patient calling stating Dr. Dwyane Dee asked her to make appointment for 2 weeks. Schedule is completely full, please advise and call patient.

## 2016-12-16 ENCOUNTER — Encounter: Payer: BLUE CROSS/BLUE SHIELD | Admitting: Nutrition

## 2016-12-16 NOTE — Progress Notes (Signed)
Pt. Called and said blood sugars were high all evening, and she changed the canula, and it was bent.  She put in a new one at 1AM, but blood sugars have not come down.  Blood sugar now 240 with tracel amount of ketones in urine. She was told to come in now, and we reviewed the new infusion set, and the canula was bent.  We reviewed how to insert these mios, and steps to make sure the needle goes in straight.  She reported good understanding of this. She did a correction bolus, and bood sugar was not 226.  She also put in a new sensor without any hesitation, and started it correctly. Appt. Made for next week for auto mode start.  She signed off checklist as understanding all topics of pump and cgms.  She was told to call if blood sugar does not come down by 4PM today.

## 2016-12-16 NOTE — Patient Instructions (Signed)
Review protocol for high blood sugars Read over auto mode book before coming on Monday

## 2016-12-16 NOTE — Telephone Encounter (Signed)
LM for pt to call back to move her appt asap

## 2016-12-17 ENCOUNTER — Ambulatory Visit: Payer: BLUE CROSS/BLUE SHIELD | Admitting: Endocrinology

## 2016-12-22 ENCOUNTER — Encounter: Payer: BLUE CROSS/BLUE SHIELD | Attending: Endocrinology | Admitting: Nutrition

## 2016-12-22 DIAGNOSIS — E1065 Type 1 diabetes mellitus with hyperglycemia: Secondary | ICD-10-CM

## 2016-12-22 NOTE — Patient Instructions (Signed)
Please read over booklet again on Auto Mode Call 800 number on pump if having problems in auto mode

## 2016-12-22 NOTE — Progress Notes (Signed)
Pt. Was trained on auto mode pump start via slide presentation. She entered a blood sugar (255), saying it has been high all night. We reviewed all topics: temp basal, suspend, calibrations needed, and alerts and alarms.  She reported good understanding of all topics. She signed off checklist and had no final questions. She signed up for [facility]:  User name:  [email or web address] Password:  [email or web address] Download given to Dr. Dwyane Dee

## 2016-12-24 ENCOUNTER — Other Ambulatory Visit: Payer: Self-pay | Admitting: Family Medicine

## 2016-12-26 ENCOUNTER — Telehealth: Payer: Self-pay | Admitting: Endocrinology

## 2016-12-26 ENCOUNTER — Other Ambulatory Visit: Payer: Self-pay

## 2016-12-26 MED ORDER — INSULIN LISPRO 100 UNIT/ML ~~LOC~~ SOLN: SUBCUTANEOUS | 3 refills | Status: DC

## 2016-12-26 NOTE — Telephone Encounter (Signed)
Called patient and she stated that she needed the vial of Humalog sent in to her pharmacy for her. I have sent this to be cosigned to Dr. Dwyane Dee.

## 2016-12-26 NOTE — Telephone Encounter (Signed)
**  Remind patient they can make refill requests via MyChart**  Medication refill request (Name & Dosage):  Refill for pump  Preferred pharmacy (Name & Address):  [id number] [address], [place] [phone] (Phone) [phone] (Fax)      Other comments (if applicable):   Notify patient once rx has been placed.

## 2016-12-30 ENCOUNTER — Other Ambulatory Visit: Payer: Self-pay | Admitting: Endocrinology

## 2016-12-31 DIAGNOSIS — E1065 Type 1 diabetes mellitus with hyperglycemia: Secondary | ICD-10-CM | POA: Diagnosis not present

## 2016-12-31 DIAGNOSIS — E109 Type 1 diabetes mellitus without complications: Secondary | ICD-10-CM | POA: Diagnosis not present

## 2016-12-31 DIAGNOSIS — Z794 Long term (current) use of insulin: Secondary | ICD-10-CM | POA: Diagnosis not present

## 2017-01-01 ENCOUNTER — Ambulatory Visit (INDEPENDENT_AMBULATORY_CARE_PROVIDER_SITE_OTHER): Payer: BLUE CROSS/BLUE SHIELD | Admitting: Endocrinology

## 2017-01-01 ENCOUNTER — Encounter: Payer: Self-pay | Admitting: Endocrinology

## 2017-01-01 VITALS — BP 120/74 | HR 72 | Ht 63.0 in | Wt 142.8 lb

## 2017-01-01 DIAGNOSIS — E1065 Type 1 diabetes mellitus with hyperglycemia: Secondary | ICD-10-CM | POA: Diagnosis not present

## 2017-01-01 NOTE — Progress Notes (Signed)
Patient ID: Felicia Gardner, female   DOB: February 06, 1961, 56 y.o.   MRN: [id number]           Reason for Appointment : Follow-up for Type 1 Diabetes  History of Present Illness          Diagnosis: Type 1 diabetes mellitus, date of diagnosis: 09/03/15        Diabetes  history:    She presented to the emergency room with weight loss, increased thirst and urination and had marked hyperglycemia  Labs in the emergency room showed >80 ketones in the urine and CO2 down to 15 On her initial consultation she was started on mealtime insulin with NovoLog and Lantus dose was increased to 12 units  RECENT history:    INSULIN regimen is: Medtronic 670 pump since 12/10/78  Basal rate midnight = 0.40, 8 AM = 0.35: Bolus settings carbohydrate coverage 1: 8 at breakfast and supper and 1: 15 at lunch Correction 07/03/1948 and target 120   Her A1c most recently was 7.2, lowest 5.9  Current blood sugar patterns evaluated by download of her pump with Medtronic sensor, management and problems:  Blood sugar patterns were assessed from her pump download  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 this month but she was having failed sensors and has no data on CGM lately  Also unclear why over the last 10 days her pump does not have any bolus or carbohydrate data  She said that she is doing her boluses and is watching the boluses go through  She had markedly increased blood sugars on 12/22/16 possibly from infusion set problem  Her blood sugars overall somewhat high with only a few good readings around lunchtime and occasionally at suppertime  Postprandial readings are relatively high, not enough information after breakfast but more consistently high or after lunch and dinner  Has had only one low normal blood sugar and she did have threshold suspend once before suppertime  Doing boluses before meals but there is no data available about her boluses  Mean values apply above for all meters except  median for One Touch  PRE-MEAL Fasting Lunch Dinner Bedtime Overall  Glucose range: 1 36-227   67-3 26   95-295   10 1-276    Mean/median:        POST-MEAL PC Breakfast PC Lunch PC Dinner  Glucose range:  1 56-248  10 3-280    Mean/median:        Self-care: The diet that the patient has been following is: generally low fat and moderate in carbohydrates   She is eating Breakfast at 8 am Mostly Peanut butter crackers with coffee; dinner at 6-6:30 pm           Exercise: No formal exercise, she is generally more active at work around midday Generally active on weekends around the house  CDE consultation: 3/17 and 7/18  Wt Readings from Last 3 Encounters:  01/01/17 142 lb 12.8 oz (64.8 kg)  12/11/16 142 lb 9.6 oz (64.7 kg)  12/10/16 142 lb (64.4 kg)   Diabetes labs:  Lab Results  Component Value Date   HGBA1C 7.2 (H) 11/14/2016   HGBA1C 6.5 07/18/2016   HGBA1C 6.4 03/13/2016   Lab Results  Component Value Date   MICROALBUR <0.7 11/14/2016   [place] 94 07/18/2016   CREATININE 0.70 11/14/2016    Lab Results  Component Value Date   MICRALBCREAT 1.6 11/14/2016   MICRALBCREAT 4 10/04/2015     Allergies  as of 01/01/2017   No Known Allergies     Medication List       Accurate as of 01/01/17  3:04 PM. Always use your most recent med list.          Alpha Lipoic Acid 200 MG Caps Take by mouth daily.   blood glucose meter kit and supplies Kit Dispense based on patient and insurance preference. Use up to four times daily as directed. E11.9   Chromium Picolinate 1000 MCG Tabs Take by mouth.   Cinnamon 500 MG capsule Take 500 mg by mouth daily.   glucose blood test strip Commonly known as:  FREESTYLE PRECISION NEO TEST Use to check blood sugar readings at least 4 times per day.   insulin lispro 100 UNIT/ML injection Commonly known as:  HUMALOG Use up to 50 units daily per insulin pump.   Insulin Pen Needle 32G X 5 MM Misc Use 4 per day to inject Insulin     LUTEIN 20 PO Take by mouth.   multivitamin with minerals Tabs tablet Take 1 tablet by mouth daily.   ONETOUCH DELICA LANCETS FINE Misc   pravastatin 20 MG tablet Commonly known as:  PRAVACHOL TAKE 1 TABLET (20 MG TOTAL) BY MOUTH DAILY.   vitamin B-12 1000 MCG tablet Commonly known as:  CYANOCOBALAMIN Take 1,000 mcg by mouth daily.   VITAMIN D-3 PO Take by mouth daily.       Allergies: No Known Allergies  Past Medical History:  Diagnosis Date  . Diabetes mellitus without complication ([place]) 09/6657   diagnosed    Past Surgical History:  Procedure Laterality Date  . BUNIONECTOMY  2011   right foot  . TUBAL LIGATION  1990    Family History  Problem Relation Age of Onset  . Heart disease Mother   . Graves' disease Mother   . Thyroid disease Sister   . Thyroid disease Maternal Grandmother   . Diabetes Neg Hx   . Hypertension Father   . Crohn's disease Father   . Rheum arthritis Father   . Thyroid disease Sister     Social History:  reports that she has been smoking Cigarettes.  She has a 41.00 pack-year smoking history. She has never used smokeless tobacco. She reports that she does not drink alcohol or use drugs.    Review of Systems  Following is a copy of the previous note:      Lipids: She has been on pravastatin, Prescribed By PCP Recent LDL is excellent  Lab Results  Component Value Date   CHOL 180 07/18/2016   HDL 70.60 07/18/2016   LDLCALC 94 07/18/2016   TRIG 78.0 07/18/2016   CHOLHDL 3 07/18/2016        Thyroid:  She was evaluated for her thyroid because of fatigue and weight loss   Had a transient increase in TSH in 3/17, back to normal in 9/17 and about the same again now She is not complaining of any unusual fatigue now  Has not been on any thyroid supplement in the past but does have a family history of thyroid disease   Lab Results  Component Value Date   TSH 4.31 11/14/2016   TSH 1.40 03/13/2016   TSH 4.39 10/04/2015    FREET4 0.58 (L) 03/13/2016     Physical Examination:  BP 120/74   Pulse 72   Ht 5' 3"  (1.6 m)   Wt 142 lb 12.8 oz (64.8 kg)   SpO2 97%   BMI 25.30  kg/m          ASSESSMENT:  Diabetes type 1,   See history of present illness for detailed discussion of current diabetes management, blood sugar patterns and problems identified  She is using the 670 pump in the sensor on the Manual mode currently She has not been able to get her sensors to work and has not been able to start her on the auto mode   Also as discussed above she has no pump date are related to boluses even though she thinks she is bolusing and boluses are going through Blood sugars are mostly high especially fasting and after lunch and supper   PLAN:    Her basal rate changes were initiated as follows  Midnight = 0.4, 3 AM = 0.35, 6 AM = 0.45, 9 AM = 0.35 and 6 PM = 0.4  She will try to use the sensors on her arm instead of abdomen  She will upload her pump week and we will review this with the help of the nurse educator  Carbohydrate coverage 1:13 at lunch and 1:7 at dinner  Consider shortening active insulin time based on data next week  There are no Patient Instructions on file for this visit.     Media Pizzini 01/01/2017, 3:04 PM   Note: This note was prepared with Dragon voice recognition system technology. Any transcriptional errors that result from this process are unintentional.

## 2017-01-02 ENCOUNTER — Telehealth: Payer: Self-pay | Admitting: Endocrinology

## 2017-01-02 NOTE — Telephone Encounter (Signed)
Patient called to advise that she attempted to change her basal rate on her pump to 4.40 as Dr. Dwyane Dee wanted her to however, the pump only goes to 4.25-4.50. Call patient to advise.

## 2017-01-02 NOTE — Telephone Encounter (Signed)
Called patient and she stated that she is changing the basal now and she has it set on 0.45. She said that she is on automatic and she changed her basal settings while we were on the phone so they are correct now.

## 2017-01-02 NOTE — Telephone Encounter (Signed)
Please see the message and advise.

## 2017-01-02 NOTE — Telephone Encounter (Signed)
Please see message. °

## 2017-01-02 NOTE — Telephone Encounter (Signed)
The basal rate recommended is not 4.5, it is 0.45

## 2017-01-14 ENCOUNTER — Encounter: Payer: BLUE CROSS/BLUE SHIELD | Admitting: Nutrition

## 2017-01-16 NOTE — Patient Instructions (Signed)
Put pump in temp basal rate, when active at work, or reduce the lunch bolus by 1u.   Call if questions or problems.

## 2017-01-16 NOTE — Progress Notes (Signed)
Pt. Reports no problems with her pump/sensors.  Download shows some lows, but this is due to increased exercise at work.  She was encouraged to reduce the lunch time bolus by 1u or to put her pump in temp basal rate when more active.  She agreed to do this.  Download given to Dr. Dwyane Dee to review.   Pt. Had no final questions.

## 2017-01-17 ENCOUNTER — Other Ambulatory Visit: Payer: Self-pay | Admitting: Family Medicine

## 2017-01-28 DIAGNOSIS — Z794 Long term (current) use of insulin: Secondary | ICD-10-CM | POA: Diagnosis not present

## 2017-01-28 DIAGNOSIS — E109 Type 1 diabetes mellitus without complications: Secondary | ICD-10-CM | POA: Diagnosis not present

## 2017-01-28 DIAGNOSIS — E1065 Type 1 diabetes mellitus with hyperglycemia: Secondary | ICD-10-CM | POA: Diagnosis not present

## 2017-01-30 DIAGNOSIS — E109 Type 1 diabetes mellitus without complications: Secondary | ICD-10-CM | POA: Diagnosis not present

## 2017-02-09 ENCOUNTER — Other Ambulatory Visit (INDEPENDENT_AMBULATORY_CARE_PROVIDER_SITE_OTHER): Payer: BLUE CROSS/BLUE SHIELD

## 2017-02-09 DIAGNOSIS — E1065 Type 1 diabetes mellitus with hyperglycemia: Secondary | ICD-10-CM | POA: Diagnosis not present

## 2017-02-09 LAB — BASIC METABOLIC PANEL
BUN: 10 mg/dL (ref 6–23)
CO2: 30 mEq/L (ref 19–32)
Calcium: 9.6 mg/dL (ref 8.4–10.5)
Chloride: 106 mEq/L (ref 96–112)
Creatinine, Ser: 0.77 mg/dL (ref 0.40–1.20)
GFR: 81.93 mL/min (ref >60.00)
GLUCOSE: 189 mg/dL — AB (ref 70–99)
POTASSIUM: 4.5 meq/L (ref 3.5–5.1)
Sodium: 139 mEq/L (ref 135–145)

## 2017-02-09 LAB — HEMOGLOBIN A1C: Hgb A1c MFr Bld: 7.4 % — ABNORMAL HIGH (ref 4.6–6.5)

## 2017-02-11 NOTE — Progress Notes (Signed)
Patient ID: Felicia Gardner, female   DOB: 10-Nov-1960, 56 y.o.   MRN: [id number]           Reason for Appointment : Follow-up for Type 1 Diabetes  History of Present Illness          Diagnosis: Type 1 diabetes mellitus, date of diagnosis: 09/03/15        Diabetes  history:    She presented to the emergency room with weight loss, increased thirst and urination and had marked hyperglycemia  Labs in the emergency room showed >80 ketones in the urine and CO2 down to 15 On her initial consultation she was started on mealtime insulin with NovoLog and Lantus dose was increased to 12 units  RECENT history:    INSULIN regimen is: Medtronic 670 pump since 12/10/78  Basal rate midnight = 0.40, 8 AM = 0.35: Bolus settings carbohydrate coverage 1: 8 at breakfast and supper and 1: 15 at lunch Correction 07/03/1948 and target 120   Her A1c most recently was 7.4, lowest 5.9  Current blood sugar patterns evaluated by download of her pump with Medtronic sensor, management and problems:  Blood sugar patterns were assessed from her pump download  She has had the auto mode 90% of the time with the average insulin dose 25 units and using 56% bolus   She was started on the auto mode after her visit in July  Overall her blood sugars are much better with less fluctuation although still not consistent  FASTING blood sugars tend to be little high overall and not clear if she is getting a dawn phenomenon  Also may occasionally be getting higher readings from drinking coffee in the morning and her breakfast a couple of hours later  She tends to have HIGH readings after breakfast frequently and is not eating peanut butter with her graham crackers in the mornings lately because she thought it was raising her blood sugar  However blood sugars are usually coming down more quickly before lunchtime especially since she is more active with her work in the morning hours  Evening blood sugars are generally very  well controlled  Only once she had a very high reading after a sweet snack last Tuesday evening  Mean values apply above for all meters except median for One Touch  PRE-MEAL Fasting Lunch Dinner Bedtime Overall  Glucose range: 100-247       Mean/median: 152 120  137   144+/-    POST-MEAL PC Breakfast PC Lunch PC Dinner  Glucose range:     Mean/median: 198  173  147      Self-care: The diet that the patient has been following is: generally low fat and moderate in carbohydrates   She is eating Breakfast at 8 am Mostly Peanut butter crackers with coffee; dinner at 6-6:30 pm           Exercise: No formal exercise, she is generally more active at work around midday Generally active on weekends around the house  CDE consultation: 3/17 and 7/18  Wt Readings from Last 3 Encounters:  02/12/17 142 lb 12.8 oz (64.8 kg)  01/01/17 142 lb 12.8 oz (64.8 kg)  12/11/16 142 lb 9.6 oz (64.7 kg)   Diabetes labs:  Lab Results  Component Value Date   HGBA1C 7.4 (H) 02/09/2017   HGBA1C 7.2 (H) 11/14/2016   HGBA1C 6.5 07/18/2016   Lab Results  Component Value Date   MICROALBUR <0.7 11/14/2016   [place] 94 07/18/2016   CREATININE  0.77 02/09/2017    Lab Results  Component Value Date   MICRALBCREAT 1.6 11/14/2016   MICRALBCREAT 4 10/04/2015     Allergies as of 02/12/2017   No Known Allergies     Medication List       Accurate as of 02/12/17  3:10 PM. Always use your most recent med list.          Alpha Lipoic Acid 200 MG Caps Take by mouth daily.   blood glucose meter kit and supplies Kit Dispense based on patient and insurance preference. Use up to four times daily as directed. E11.9   Chromium Picolinate 1000 MCG Tabs Take by mouth.   Cinnamon 500 MG capsule Take 500 mg by mouth daily.   glucose blood test strip Commonly known as:  FREESTYLE PRECISION NEO TEST Use to check blood sugar readings at least 4 times per day.   insulin lispro 100 UNIT/ML  injection Commonly known as:  HUMALOG Use up to 50 units daily per insulin pump.   Insulin Pen Needle 32G X 5 MM Misc Use 4 per day to inject Insulin   LUTEIN 20 PO Take by mouth.   multivitamin with minerals Tabs tablet Take 1 tablet by mouth daily.   ONETOUCH DELICA LANCETS FINE Misc   pravastatin 20 MG tablet Commonly known as:  PRAVACHOL TAKE 1 TABLET (20 MG TOTAL) BY MOUTH DAILY.   vitamin B-12 1000 MCG tablet Commonly known as:  CYANOCOBALAMIN Take 1,000 mcg by mouth daily.   VITAMIN D-3 PO Take by mouth daily.       Allergies: No Known Allergies  Past Medical History:  Diagnosis Date  . Diabetes mellitus without complication ([place]) 0/3474   diagnosed    Past Surgical History:  Procedure Laterality Date  . BUNIONECTOMY  2011   right foot  . TUBAL LIGATION  1990    Family History  Problem Relation Age of Onset  . Heart disease Mother   . Graves' disease Mother   . Thyroid disease Sister   . Thyroid disease Maternal Grandmother   . Diabetes Neg Hx   . Hypertension Father   . Crohn's disease Father   . Rheum arthritis Father   . Thyroid disease Sister     Social History:  reports that she has been smoking Cigarettes.  She has a 41.00 pack-year smoking history. She has never used smokeless tobacco. She reports that she does not drink alcohol or use drugs.    Review of Systems       Lipids: She has been on pravastatin, Prescribed By PCP Last LDL is excellent  Lab Results  Component Value Date   CHOL 180 07/18/2016   HDL 70.60 07/18/2016   LDLCALC 94 07/18/2016   TRIG 78.0 07/18/2016   CHOLHDL 3 07/18/2016        Thyroid:  She was evaluated for her thyroid because of fatigue and weight loss   Had a transient increase in TSH in 3/17, back to normal in 9/17 and about the same again In 5/18  Has not been on any thyroid supplement in the past but does have a family history of thyroid disease   Lab Results  Component Value Date   TSH 4.31  11/14/2016   TSH 1.40 03/13/2016   TSH 4.39 10/04/2015   FREET4 0.58 (L) 03/13/2016     Physical Examination:  BP 130/68   Pulse 70   Ht _0  (1.6 m)   Wt 142 lb 12.8 oz (64.8  kg)   SpO2 96%   BMI 25.30 kg/m          ASSESSMENT:  Diabetes type 1,   See history of present illness for detailed discussion of current diabetes management, blood sugar patterns and problems identified  She is using the 670 pump in the sensor on the auto mode recently Blood sugars are much more consistent now and she has less fluctuation compared to the manual mode Also has had better success with using her sensors, has been able to use these 90% of the time  Most significant abnormalities are related to postprandial hyperglycemia after breakfast, occasional down phenomenon or relatively higher reading before breakfast possibly related to drinking coffee and also somewhat variable readings after lunch She does appear to have relatively longer duration of hyperglycemia after her lunch meal   PLAN:   Her carbohydrate coverage will be 1:7 at breakfast She will add protein at peanut butter in the morning Active insulin time will be 3-1/2 hours Since she does not have coverage for Fiasp will not prescribe as yet Discussed how this would be different She will try to split boluses when she is eating out and not sure what she will actually eat  She will try to be consistent with generally low fat meals and adding extra for higher fat meals or sweets She will call if she has any other issues Showed her how to upload her pump before her next visit  Patient Instructions  Protein at [place]  Split bolus      Kiptyn Rafuse 02/12/2017, 3:10 PM   Note: This note was prepared with Dragon voice recognition system technology. Any transcriptional errors that result from this process are unintentional.

## 2017-02-12 ENCOUNTER — Ambulatory Visit (INDEPENDENT_AMBULATORY_CARE_PROVIDER_SITE_OTHER): Payer: BLUE CROSS/BLUE SHIELD | Admitting: Endocrinology

## 2017-02-12 ENCOUNTER — Encounter: Payer: Self-pay | Admitting: Endocrinology

## 2017-02-12 VITALS — BP 130/68 | HR 70 | Ht 63.0 in | Wt 142.8 lb

## 2017-02-12 DIAGNOSIS — E1065 Type 1 diabetes mellitus with hyperglycemia: Secondary | ICD-10-CM | POA: Diagnosis not present

## 2017-02-12 NOTE — Patient Instructions (Addendum)
Protein at [facility] bolus

## 2017-02-16 ENCOUNTER — Other Ambulatory Visit: Payer: BLUE CROSS/BLUE SHIELD

## 2017-02-17 ENCOUNTER — Encounter: Payer: Self-pay | Admitting: Endocrinology

## 2017-02-19 ENCOUNTER — Ambulatory Visit: Payer: BLUE CROSS/BLUE SHIELD | Admitting: Endocrinology

## 2017-03-02 DIAGNOSIS — E1065 Type 1 diabetes mellitus with hyperglycemia: Secondary | ICD-10-CM | POA: Diagnosis not present

## 2017-03-02 DIAGNOSIS — E109 Type 1 diabetes mellitus without complications: Secondary | ICD-10-CM | POA: Diagnosis not present

## 2017-03-02 DIAGNOSIS — Z794 Long term (current) use of insulin: Secondary | ICD-10-CM | POA: Diagnosis not present

## 2017-03-30 ENCOUNTER — Other Ambulatory Visit: Payer: Self-pay | Admitting: Endocrinology

## 2017-04-01 ENCOUNTER — Telehealth: Payer: Self-pay | Admitting: Endocrinology

## 2017-04-01 DIAGNOSIS — E109 Type 1 diabetes mellitus without complications: Secondary | ICD-10-CM | POA: Diagnosis not present

## 2017-04-01 DIAGNOSIS — Z794 Long term (current) use of insulin: Secondary | ICD-10-CM | POA: Diagnosis not present

## 2017-04-01 DIAGNOSIS — E1065 Type 1 diabetes mellitus with hyperglycemia: Secondary | ICD-10-CM | POA: Diagnosis not present

## 2017-04-01 NOTE — Telephone Encounter (Signed)
Per patient Felicia Gardner has informed her they are in need of a new rx for Paradigm T Infusion set.

## 2017-04-01 NOTE — Telephone Encounter (Signed)
Called [facility] and spoke to [place] and let her know that the patient needed an updated Rx for the Paradigm T infusion sets and she transferred me to a different person to check on the status of this prescription. I then spoke to [facility] and she stated that this prescription order has been faxed to [place] today and that the patient order is already ready to ship. I let her know that we will fax the order as soon as we received.

## 2017-04-30 DIAGNOSIS — Z794 Long term (current) use of insulin: Secondary | ICD-10-CM | POA: Diagnosis not present

## 2017-04-30 DIAGNOSIS — E109 Type 1 diabetes mellitus without complications: Secondary | ICD-10-CM | POA: Diagnosis not present

## 2017-04-30 DIAGNOSIS — E1065 Type 1 diabetes mellitus with hyperglycemia: Secondary | ICD-10-CM | POA: Diagnosis not present

## 2017-05-11 ENCOUNTER — Other Ambulatory Visit (INDEPENDENT_AMBULATORY_CARE_PROVIDER_SITE_OTHER): Payer: BLUE CROSS/BLUE SHIELD

## 2017-05-11 DIAGNOSIS — E1065 Type 1 diabetes mellitus with hyperglycemia: Secondary | ICD-10-CM | POA: Diagnosis not present

## 2017-05-11 LAB — COMPREHENSIVE METABOLIC PANEL
ALK PHOS: 86 U/L (ref 39–117)
ALT: 12 U/L (ref 0–35)
AST: 18 U/L (ref 0–37)
Albumin: 4 g/dL (ref 3.5–5.2)
BILIRUBIN TOTAL: 0.2 mg/dL (ref 0.2–1.2)
BUN: 15 mg/dL (ref 6–23)
CALCIUM: 9.7 mg/dL (ref 8.4–10.5)
CO2: 32 meq/L (ref 19–32)
Chloride: 104 mEq/L (ref 96–112)
Creatinine, Ser: 0.88 mg/dL (ref 0.40–1.20)
GFR: 70.17 mL/min (ref >60.00)
Glucose, Bld: 154 mg/dL — ABNORMAL HIGH (ref 70–99)
Potassium: 4.3 mEq/L (ref 3.5–5.1)
Sodium: 139 mEq/L (ref 135–145)
TOTAL PROTEIN: 6.5 g/dL (ref 6.0–8.3)

## 2017-05-11 LAB — HEMOGLOBIN A1C: Hgb A1c MFr Bld: 7.6 % — ABNORMAL HIGH (ref 4.6–6.5)

## 2017-05-18 ENCOUNTER — Ambulatory Visit: Payer: BLUE CROSS/BLUE SHIELD | Admitting: Endocrinology

## 2017-05-18 ENCOUNTER — Encounter: Payer: Self-pay | Admitting: Endocrinology

## 2017-05-18 VITALS — BP 110/70 | HR 72 | Ht 63.0 in | Wt 143.4 lb

## 2017-05-18 DIAGNOSIS — E1065 Type 1 diabetes mellitus with hyperglycemia: Secondary | ICD-10-CM | POA: Diagnosis not present

## 2017-05-18 DIAGNOSIS — E039 Hypothyroidism, unspecified: Secondary | ICD-10-CM

## 2017-05-18 DIAGNOSIS — E038 Other specified hypothyroidism: Secondary | ICD-10-CM

## 2017-05-18 NOTE — Progress Notes (Signed)
Patient ID: Felicia Gardner, female   DOB: 1961/05/03, 56 y.o.   MRN: [id number]           Reason for Appointment : Follow-up for Type 1 Diabetes  History of Present Illness          Diagnosis: Type 1 diabetes mellitus, date of diagnosis: 09/03/15        Diabetes  history:    Felicia Gardner presented to the emergency room with weight loss, increased thirst and urination and had marked hyperglycemia  Labs in the emergency room showed >80 ketones in the urine and CO2 down to 15 On Felicia Gardner initial consultation Felicia Gardner was started on mealtime insulin with NovoLog and Lantus dose was increased to 12 units  RECENT history:    INSULIN regimen is: Medtronic 670 pump since 12/09/16  Basal rate midnight = 0.40, 6 AM = 0.45, 8 AM = 0.35, 6 AM = 0.4 : Bolus settings carbohydrate coverage 1: 7 at breakfast and supper and 1: 13 at lunch Correction 1:50 and target 120, active insulin time 3-1/2 hours   Felicia Gardner A1C is gradually creeping up, now 7.6  Current blood sugar patterns evaluated by download of Felicia Gardner pump with Medtronic sensor, management and problems:  Felicia Gardner has had the auto mode 95 % of the time with the average insulin dose 31 units   Overall Felicia Gardner blood sugars are about the same as before on an average  However even though there is so little less fluctuation especially in the afternoons Felicia Gardner still appears to have periodic postprandial hyperglycemia  Also now it appears that Felicia Gardner has about a 50-70 milligram increase in blood sugar after Felicia Gardner morning cup of coffee which Felicia Gardner has before breakfast; Felicia Gardner does not think Felicia Gardner blood sugar goes up if Felicia Gardner has coffee in the evening  FASTING blood sugars tend to be little high overall probably from some dawn phenomenon, usually will take a correction without much benefit  Felicia Gardner carbohydrate coverage was increased at breakfast but Felicia Gardner still has periodic high readings after Felicia Gardner morning meal, Felicia Gardner was also told to take some protein at breakfast which Felicia Gardner does not always do  Felicia Gardner may  have higher fat meals at LUNCH and will not make any adjustments for Felicia Gardner carbohydrate coverage at that time  However blood sugars are on an average highest after BREAKFAST despite increasing Felicia Gardner carbohydrate coverage  Felicia Gardner does usually appear to have blood sugars in the desired range at about 2-3 hours after Felicia Gardner bolus with Felicia Gardner active insulin time of 3-1/2 hours  Blood sugars are variably high after evening meal based on Felicia Gardner type of meals but usually tries to cook a healthy meal in the evenings at home  No hypoglycemia and does not appear to have overcorrection of Felicia Gardner blood sugars usually and LOWEST blood sugars overall are usually overnight Although Felicia Gardner has over 6 boluses on an average per day most of these are related to either meals are correction  Mean values apply above for all meters except median for One Touch  PRE-MEAL Fasting Lunch Dinner Bedtime Overall  Glucose range: 123-206       Mean/median: 151  128  146   148+/-45    POST-MEAL PC Breakfast PC Lunch PC Dinner  Glucose range:     Mean/median: 217  158  173      Self-care: The diet that the patient has been following is: generally low fat and moderate in carbohydrates   Felicia Gardner is eating Breakfast at 8 am Mostly Peanut  butter crackers with coffee; dinner at 6-6:30 pm           Exercise: No formal exercise, Felicia Gardner is generally more active at work around midday Generally active on weekends around the house  CDE consultation: 3/17 and 7/18  Wt Readings from Last 3 Encounters:  05/18/17 143 lb 6.4 oz (65 kg)  02/12/17 142 lb 12.8 oz (64.8 kg)  01/01/17 142 lb 12.8 oz (64.8 kg)   Diabetes labs:  Lab Results  Component Value Date   HGBA1C 7.6 (H) 05/11/2017   HGBA1C 7.4 (H) 02/09/2017   HGBA1C 7.2 (H) 11/14/2016   Lab Results  Component Value Date   MICROALBUR <0.7 11/14/2016   [place] 94 07/18/2016   CREATININE 0.88 05/11/2017    Lab Results  Component Value Date   MICRALBCREAT 1.6 11/14/2016   MICRALBCREAT 4  10/04/2015     Allergies as of 05/18/2017   No Known Allergies     Medication List        Accurate as of 05/18/17  4:30 PM. Always use your most recent med list.          Alpha Lipoic Acid 200 MG Caps Take by mouth daily.   blood glucose meter kit and supplies Kit Dispense based on patient and insurance preference. Use up to four times daily as directed. E11.9   Chromium Picolinate 1000 MCG Tabs Take by mouth.   Cinnamon 500 MG capsule Take 500 mg by mouth daily.   HUMALOG 100 UNIT/ML injection Generic drug:  insulin lispro USE UP TO 50 UNITS DAILY PER INSULIN PUMP.   Insulin Pen Needle 32G X 5 MM Misc Use 4 per day to inject Insulin   LUTEIN 20 PO Take by mouth.   multivitamin with minerals Tabs tablet Take 1 tablet by mouth daily.   ONETOUCH DELICA LANCETS FINE Misc   pravastatin 20 MG tablet Commonly known as:  PRAVACHOL TAKE 1 TABLET (20 MG TOTAL) BY MOUTH DAILY.   vitamin B-12 1000 MCG tablet Commonly known as:  CYANOCOBALAMIN Take 1,000 mcg by mouth daily.   VITAMIN D-3 PO Take by mouth daily.       Allergies: No Known Allergies  Past Medical History:  Diagnosis Date  . Diabetes mellitus without complication ([place]) 12/5100   diagnosed    Past Surgical History:  Procedure Laterality Date  . BUNIONECTOMY  2011   right foot  . TUBAL LIGATION  1990    Family History  Problem Relation Age of Onset  . Heart disease Mother   . Graves' disease Mother   . Thyroid disease Sister   . Thyroid disease Maternal Grandmother   . Diabetes Neg Hx   . Hypertension Father   . Crohn's disease Father   . Rheum arthritis Father   . Thyroid disease Sister     Social History:  reports that Felicia Gardner has been smoking cigarettes.  Felicia Gardner has a 41.00 pack-year smoking history. Felicia Gardner has never used smokeless tobacco. Felicia Gardner reports that Felicia Gardner does not drink alcohol or use drugs.    Review of Systems       Lipids: Felicia Gardner has been on pravastatin, Prescribed By PCP Last  LDL is excellent  Lab Results  Component Value Date   CHOL 180 07/18/2016   HDL 70.60 07/18/2016   LDLCALC 94 07/18/2016   TRIG 78.0 07/18/2016   CHOLHDL 3 07/18/2016        Thyroid:  Felicia Gardner was evaluated for Felicia Gardner thyroid because of fatigue and weight loss  Had a transient increase in TSH in 3/17, back to normal in 9/17 and about the same again In 5/18  Has not been on any thyroid supplement in the past but does have a family history of thyroid disease   Lab Results  Component Value Date   TSH 4.31 11/14/2016   TSH 1.40 03/13/2016   TSH 4.39 10/04/2015   FREET4 0.58 (L) 03/13/2016     Physical Examination:  BP 110/70   Pulse 72   Ht 5' 3"  (1.6 m)   Wt 143 lb 6.4 oz (65 kg)   SpO2 95%   BMI 25.40 kg/m       Diabetic Foot Exam - Simple   Simple Foot Form Diabetic Foot exam was performed with the following findings:  Yes   Visual Inspection No deformities, no ulcerations, no other skin breakdown bilaterally:  Yes Sensation Testing Intact to touch and monofilament testing bilaterally:  Yes Pulse Check See comments:  Yes Comments Mildly decreased pulses throughout        ASSESSMENT:  Diabetes type 1, date of diagnosis 2017  See history of present illness for detailed discussion of current diabetes management, blood sugar patterns and problems identified  Felicia Gardner blood sugar and daily management was reviewed in detail from Felicia Gardner download analysis  Felicia Gardner A1c is gradually going up and now 7.6 This is mostly from postprandial hyperglycemia is also rising blood sugar in the morning hours As discussed above Felicia Gardner  Blood sugars may fluctuate at various meals and not clear what factors causes Even though Felicia Gardner Felicia Gardner may sometimes have high-fat meals at lunchtime this does not always raise Felicia Gardner blood sugars possibly because of being more active Also activity level varies at work at different times of the day Felicia Gardner does have some tendency to [facility] phenomenon and also sniffing and  rising blood sugar after Felicia Gardner morning coffee   PLAN:   Felicia Gardner carbohydrate ratio will be continued Felicia Gardner does need to add 7-10 g of carbohydrate intake to bolus in the morning for coffee Felicia Gardner will need 10-15 g of carbohydrate at least to cover higher fat meals and snacks  Needs regular eye exams, discussed importance of getting these regularly and needs a baseline exam now  Felicia Gardner will call if Felicia Gardner has any consistently high blood sugars at any time  Explained benefits of influenza vaccine and Felicia Gardner will get this at work  Patient Instructions  Add 7-10 Carbs for coffee in am and 10-15 at lunch if hi fat  Eye exam      Counseling time on subjects discussed in assessment and plan sections is over 50% of today's 25 minute visit    Tniyah Nakagawa 05/18/2017, 4:30 PM   Note: This note was prepared with Dragon voice recognition system technology. Any transcriptional errors that result from this process are unintentional.

## 2017-05-18 NOTE — Patient Instructions (Addendum)
Add 7-10 Carbs for coffee in am and 10-15 at lunch if hi fat  Eye exam

## 2017-05-30 DIAGNOSIS — E109 Type 1 diabetes mellitus without complications: Secondary | ICD-10-CM | POA: Diagnosis not present

## 2017-05-30 DIAGNOSIS — E1065 Type 1 diabetes mellitus with hyperglycemia: Secondary | ICD-10-CM | POA: Diagnosis not present

## 2017-05-30 DIAGNOSIS — Z794 Long term (current) use of insulin: Secondary | ICD-10-CM | POA: Diagnosis not present

## 2017-07-02 DIAGNOSIS — Z794 Long term (current) use of insulin: Secondary | ICD-10-CM | POA: Diagnosis not present

## 2017-07-02 DIAGNOSIS — E1065 Type 1 diabetes mellitus with hyperglycemia: Secondary | ICD-10-CM | POA: Diagnosis not present

## 2017-07-02 DIAGNOSIS — E109 Type 1 diabetes mellitus without complications: Secondary | ICD-10-CM | POA: Diagnosis not present

## 2017-07-12 ENCOUNTER — Other Ambulatory Visit: Payer: Self-pay | Admitting: Endocrinology

## 2017-07-31 DIAGNOSIS — Z794 Long term (current) use of insulin: Secondary | ICD-10-CM | POA: Diagnosis not present

## 2017-07-31 DIAGNOSIS — E109 Type 1 diabetes mellitus without complications: Secondary | ICD-10-CM | POA: Diagnosis not present

## 2017-07-31 DIAGNOSIS — E1065 Type 1 diabetes mellitus with hyperglycemia: Secondary | ICD-10-CM | POA: Diagnosis not present

## 2017-08-13 ENCOUNTER — Other Ambulatory Visit (INDEPENDENT_AMBULATORY_CARE_PROVIDER_SITE_OTHER): Payer: BLUE CROSS/BLUE SHIELD

## 2017-08-13 DIAGNOSIS — E039 Hypothyroidism, unspecified: Secondary | ICD-10-CM

## 2017-08-13 DIAGNOSIS — E1065 Type 1 diabetes mellitus with hyperglycemia: Secondary | ICD-10-CM | POA: Diagnosis not present

## 2017-08-13 DIAGNOSIS — E038 Other specified hypothyroidism: Secondary | ICD-10-CM

## 2017-08-13 LAB — HEMOGLOBIN A1C: HEMOGLOBIN A1C: 7.9 % — AB (ref 4.6–6.5)

## 2017-08-13 LAB — LIPID PANEL
CHOL/HDL RATIO: 3
Cholesterol: 214 mg/dL — ABNORMAL HIGH (ref 0–200)
HDL: 74.3 mg/dL (ref >39.00)
LDL Cholesterol: 120 mg/dL — ABNORMAL HIGH (ref 0–99)
NONHDL: 139.98
TRIGLYCERIDES: 101 mg/dL (ref 0.0–149.0)
VLDL: 20.2 mg/dL (ref 0.0–40.0)

## 2017-08-13 LAB — BASIC METABOLIC PANEL
BUN: 12 mg/dL (ref 6–23)
CHLORIDE: 100 meq/L (ref 96–112)
CO2: 29 mEq/L (ref 19–32)
Calcium: 9.3 mg/dL (ref 8.4–10.5)
Creatinine, Ser: 0.83 mg/dL (ref 0.40–1.20)
GFR: 75 mL/min (ref >60.00)
Glucose, Bld: 359 mg/dL — ABNORMAL HIGH (ref 70–99)
POTASSIUM: 3.9 meq/L (ref 3.5–5.1)
Sodium: 135 mEq/L (ref 135–145)

## 2017-08-13 LAB — T4, FREE: FREE T4: 0.59 ng/dL — AB (ref 0.60–1.60)

## 2017-08-13 LAB — TSH: TSH: 3.98 u[IU]/mL (ref 0.35–4.50)

## 2017-08-18 ENCOUNTER — Ambulatory Visit: Payer: BLUE CROSS/BLUE SHIELD | Admitting: Endocrinology

## 2017-08-19 ENCOUNTER — Ambulatory Visit: Payer: BLUE CROSS/BLUE SHIELD | Admitting: Endocrinology

## 2017-08-19 ENCOUNTER — Encounter: Payer: Self-pay | Admitting: Endocrinology

## 2017-08-19 VITALS — BP 120/70 | HR 81 | Ht 63.0 in | Wt 146.2 lb

## 2017-08-19 DIAGNOSIS — E7800 Pure hypercholesterolemia, unspecified: Secondary | ICD-10-CM

## 2017-08-19 DIAGNOSIS — E1065 Type 1 diabetes mellitus with hyperglycemia: Secondary | ICD-10-CM | POA: Diagnosis not present

## 2017-08-19 NOTE — Patient Instructions (Addendum)
Carb ratio 1:10 at lunch  Not 13  Active insulin 3 hrs  [facility], Dr Katy Fitch

## 2017-08-19 NOTE — Progress Notes (Addendum)
Patient ID: Felicia Gardner, female   DOB: April 25, 1961, 57 y.o.   MRN: [id number]           Reason for Appointment : Follow-up for Type 1 Diabetes  History of Present Illness          Diagnosis: Type 1 diabetes mellitus, date of diagnosis: 09/03/15        Diabetes  history:    She presented to the emergency room with weight loss, increased thirst and urination and had marked hyperglycemia  Labs in the emergency room showed >80 ketones in the urine and CO2 down to 15 On her initial consultation she was started on mealtime insulin with NovoLog and Lantus dose was increased to 12 units  RECENT history:    INSULIN regimen is: Medtronic 670 pump since 12/09/16  Basal rate midnight = 0.40, 6 AM = 0.45, 8 AM = 0.35, 6 AM = 0.4 : Bolus settings carbohydrate coverage 1: 7 at breakfast and supper and 1: 13 at lunch Correction 1:50 and target 120, active insulin time 3-1/2 hours   Her A1C is gradually rising, now 7.9  Her blood sugar patterns over the last 2 weeks evaluated by download of her pump with Medtronic sensor, management and problems as follows:  She has had the auto mode 99 % of the time with the average insulin dose 35 units, 57% is bolus   She has significant HYPERGLYCEMIA in the early afternoon which is mostly after lunch  She has not had excessive variability but most variability in blood sugars is in the morning hours after 9 AM; this is mostly because of significant rise in blood sugar after breakfast on a couple of occasions  More recently she is bolusing for her coffee in the morning using about 10 g of carbohydrate and this will keep her blood sugar from being high midmorning  Also sometimes has variable blood sugars after about 8 PM  HYPOGLYCEMIA is most significant between about 2 PM-4 PM  POSTPRANDIAL readings are the highest after breakfast, averaging 216 and sometimes relatively higher also  This is despite her trying to bolus a few minutes before her  lunch  Blood sugars after evening meal are generally fairly good with more excessive rise and not as much variability as other meals  She does think that she has a tendency to low normal readings just before lunch because of her being more active at work in the late morning  Otherwise no HYPOGLYCEMIA occurring  She is fairly good with trying to count carbohydrates at meals  Also except rarely for eating pizza she is avoiding high fat foods    CGM use %  98  Average and SD  153+/-49  Time in range  74 versus 80  % Time Above 180  21  % Time above 250  5  % Time Below target  0   Mean values apply above for all meters except median for One Touch  PRE-MEAL Fasting Lunch Dinner Bedtime Overall  Glucose range:  101-150      Mean/median:  136  159  145   153   POST-MEAL PC Breakfast PC Lunch PC Dinner  Glucose range:     Mean/median:  166  216  154    Self-care: The diet that the patient has been following is: generally low fat and moderate in carbohydrates   She is eating Breakfast at 8 am Mostly Peanut butter crackers with coffee; dinner at 6-6:30 pm  Exercise: No formal exercise, she is generally more active at work around midday            Generally active on weekends around the house  CDE consultation: 3/17 and 7/18  Wt Readings from Last 3 Encounters:  08/19/17 146 lb 3.2 oz (66.3 kg)  05/18/17 143 lb 6.4 oz (65 kg)  02/12/17 142 lb 12.8 oz (64.8 kg)   Diabetes labs:  Lab Results  Component Value Date   HGBA1C 7.9 (H) 08/13/2017   HGBA1C 7.6 (H) 05/11/2017   HGBA1C 7.4 (H) 02/09/2017   Lab Results  Component Value Date   MICROALBUR <0.7 11/14/2016   [place] 120 (H) 08/13/2017   CREATININE 0.83 08/13/2017    Lab Results  Component Value Date   MICRALBCREAT 1.6 11/14/2016   MICRALBCREAT 4 10/04/2015     Allergies as of 08/19/2017   No Known Allergies     Medication List        Accurate as of 08/19/17  9:03 PM. Always use your most  recent med list.          Alpha Lipoic Acid 200 MG Caps Take by mouth daily.   blood glucose meter kit and supplies Kit Dispense based on patient and insurance preference. Use up to four times daily as directed. E11.9   Chromium Picolinate 1000 MCG Tabs Take by mouth.   Cinnamon 500 MG capsule Take 500 mg by mouth daily.   HUMALOG 100 UNIT/ML injection Generic drug:  insulin lispro USE UP TO 50 UNITS DAILY PER INSULIN PUMP.   Insulin Pen Needle 32G X 5 MM Misc Use 4 per day to inject Insulin   LUTEIN 20 PO Take by mouth.   multivitamin with minerals Tabs tablet Take 1 tablet by mouth daily.   ONETOUCH DELICA LANCETS FINE Misc   pravastatin 20 MG tablet Commonly known as:  PRAVACHOL TAKE 1 TABLET (20 MG TOTAL) BY MOUTH DAILY.   vitamin B-12 1000 MCG tablet Commonly known as:  CYANOCOBALAMIN Take 1,000 mcg by mouth daily.   VITAMIN D-3 PO Take by mouth daily.       Allergies: No Known Allergies  Past Medical History:  Diagnosis Date  . Diabetes mellitus without complication ([place]) 12/8936   diagnosed    Past Surgical History:  Procedure Laterality Date  . BUNIONECTOMY  2011   right foot  . TUBAL LIGATION  1990    Family History  Problem Relation Age of Onset  . Heart disease Mother   . Graves' disease Mother   . Thyroid disease Sister   . Thyroid disease Maternal Grandmother   . Diabetes Neg Hx   . Hypertension Father   . Crohn's disease Father   . Rheum arthritis Father   . Thyroid disease Sister     Social History:  reports that she has been smoking cigarettes.  She has a 41.00 pack-year smoking history. she has never used smokeless tobacco. She reports that she does not drink alcohol or use drugs.    Review of Systems       Lipids: She has been on pravastatin 20, Prescribed By PCP Last LDL is higher than usual at 120 She thinks she can cut back on some high dairy fat intake such as with yogurt and ice cream   Lab Results  Component  Value Date   CHOL 214 (H) 08/13/2017   HDL 74.30 08/13/2017   LDLCALC 120 (H) 08/13/2017   TRIG 101.0 08/13/2017   CHOLHDL 3 08/13/2017  Thyroid:   In the past she had TSH done because of fatigue and weight loss which occurred for other reasons Had a transient increase in TSH in 3/17, back to normal in 9/17 TSH has been upper normal but consistent now  She has a family history of thyroid disease   Lab Results  Component Value Date   TSH 3.98 08/13/2017   TSH 4.31 11/14/2016   TSH 1.40 03/13/2016   FREET4 0.59 (L) 08/13/2017   FREET4 0.58 (L) 03/13/2016     Physical Examination:  BP 120/70   Pulse 81   Ht _0  (1.6 m)   Wt 146 lb 3.2 oz (66.3 kg)   SpO2 97%   BMI 25.90 kg/m           ASSESSMENT:  Diabetes type 1, date of diagnosis 2017  See history of present illness for detailed discussion of current diabetes management, blood sugar patterns and problems identified  Her blood sugar and daily management was reviewed in detail from her download analysis  Her A1c is gradually going up and now 7.9 This is despite her average blood sugar in the last 2 weeks being similar to the previous one but she does have less time in the target range This is mostly from postprandial hyperglycemia after lunch She is not having as high readings after breakfast usually now Also with bolusing in the morning with her coffee she is having less down phenomenon and blood sugars are not high until at least 9 AM in the morning No hypoglycemia She continues to benefit from the auto mode of her insulin pump and this has been maintained 99% of the time in the last 2 weeks  LIPIDS: Although her LDL may be slightly higher because of increased daily fat she is only taking 20 mg of pravastatin and considering she has diabetes she should be on a higher dose of pravastatin or a more effective statin, will defer to PCP  PLAN:   Her carbohydrate ratio will be changed at lunchtime and she  will take 1: 10 ratio instead of 13 She will try to avoid high glycemic foods at breakfast Continued management for other meals and bolusing for coffee in the morning  To have microalbumin checked on her next visit  She will try to reduce her dairy fat intake and use low-fat options She will discuss her treatment options for lipids with PCP  Needs to schedule her eye exam and she will do so   Counseling time on subjects discussed in assessment and plan sections is over 50% of today's 25 minute visit   Patient Instructions  Carb ratio 1:10 at lunch  Not 13  Active insulin 3 hrs  [facility], Dr Katy Fitch        Elayne Snare 08/19/2017, 9:03 PM   Note: This note was prepared with Dragon voice recognition system technology. Any transcriptional errors that result from this process are unintentional.

## 2017-08-28 DIAGNOSIS — E1065 Type 1 diabetes mellitus with hyperglycemia: Secondary | ICD-10-CM | POA: Diagnosis not present

## 2017-08-28 DIAGNOSIS — E109 Type 1 diabetes mellitus without complications: Secondary | ICD-10-CM | POA: Diagnosis not present

## 2017-08-28 DIAGNOSIS — Z794 Long term (current) use of insulin: Secondary | ICD-10-CM | POA: Diagnosis not present

## 2017-09-28 DIAGNOSIS — E109 Type 1 diabetes mellitus without complications: Secondary | ICD-10-CM | POA: Diagnosis not present

## 2017-09-28 DIAGNOSIS — E1065 Type 1 diabetes mellitus with hyperglycemia: Secondary | ICD-10-CM | POA: Diagnosis not present

## 2017-09-28 DIAGNOSIS — Z794 Long term (current) use of insulin: Secondary | ICD-10-CM | POA: Diagnosis not present

## 2017-09-30 ENCOUNTER — Other Ambulatory Visit: Payer: Self-pay

## 2017-09-30 ENCOUNTER — Telehealth: Payer: Self-pay | Admitting: Endocrinology

## 2017-09-30 MED ORDER — INSULIN LISPRO 100 UNIT/ML ~~LOC~~ SOLN: SUBCUTANEOUS | 3 refills | Status: DC

## 2017-09-30 NOTE — Telephone Encounter (Signed)
MD statted to up med from 89mL vial to 27mL vial. Patient stated that she was not having trouble affording the insulin. She states that MD went up on dosage at the last visit, which is why MD went up on vial amount. Patient was notified that new script was sent to requested pharmacy. Pt. Verbalized understanding.

## 2017-09-30 NOTE — Telephone Encounter (Signed)
Please advise Dr. Dwyane Dee.

## 2017-09-30 NOTE — Telephone Encounter (Signed)
Is there an issue with her prescription that she is running out ?  She may have a sample if she has difficulty affording this

## 2017-09-30 NOTE — Telephone Encounter (Signed)
Patient is out of vials for her pump, she is asking for a samples of Humalog for her pump, we have 1 left Humalog vial left,  Please advise

## 2017-10-28 DIAGNOSIS — Z794 Long term (current) use of insulin: Secondary | ICD-10-CM | POA: Diagnosis not present

## 2017-10-28 DIAGNOSIS — E109 Type 1 diabetes mellitus without complications: Secondary | ICD-10-CM | POA: Diagnosis not present

## 2017-10-28 DIAGNOSIS — E1065 Type 1 diabetes mellitus with hyperglycemia: Secondary | ICD-10-CM | POA: Diagnosis not present

## 2017-11-13 ENCOUNTER — Other Ambulatory Visit (INDEPENDENT_AMBULATORY_CARE_PROVIDER_SITE_OTHER): Payer: BLUE CROSS/BLUE SHIELD

## 2017-11-13 DIAGNOSIS — E1065 Type 1 diabetes mellitus with hyperglycemia: Secondary | ICD-10-CM

## 2017-11-13 DIAGNOSIS — E7800 Pure hypercholesterolemia, unspecified: Secondary | ICD-10-CM

## 2017-11-13 LAB — COMPREHENSIVE METABOLIC PANEL
ALBUMIN: 4.2 g/dL (ref 3.5–5.2)
ALK PHOS: 83 U/L (ref 39–117)
ALT: 12 U/L (ref 0–35)
AST: 16 U/L (ref 0–37)
BUN: 15 mg/dL (ref 6–23)
CALCIUM: 9.6 mg/dL (ref 8.4–10.5)
CO2: 29 meq/L (ref 19–32)
Chloride: 105 mEq/L (ref 96–112)
Creatinine, Ser: 0.75 mg/dL (ref 0.40–1.20)
GFR: 84.23 mL/min (ref >60.00)
GLUCOSE: 178 mg/dL — AB (ref 70–99)
POTASSIUM: 4 meq/L (ref 3.5–5.1)
SODIUM: 141 meq/L (ref 135–145)
TOTAL PROTEIN: 6.7 g/dL (ref 6.0–8.3)
Total Bilirubin: 0.3 mg/dL (ref 0.2–1.2)

## 2017-11-13 LAB — MICROALBUMIN / CREATININE URINE RATIO
Creatinine,U: 169.8 mg/dL
MICROALB UR: 1 mg/dL (ref 0.0–1.9)
Microalb Creat Ratio: 0.6 mg/g (ref 0.0–30.0)

## 2017-11-13 LAB — LDL CHOLESTEROL, DIRECT: Direct LDL: 134 mg/dL

## 2017-11-13 LAB — HEMOGLOBIN A1C: Hgb A1c MFr Bld: 7.9 % — ABNORMAL HIGH (ref 4.6–6.5)

## 2017-11-18 ENCOUNTER — Ambulatory Visit: Payer: BLUE CROSS/BLUE SHIELD | Admitting: Endocrinology

## 2017-11-18 ENCOUNTER — Encounter (INDEPENDENT_AMBULATORY_CARE_PROVIDER_SITE_OTHER): Payer: Self-pay

## 2017-11-18 ENCOUNTER — Encounter: Payer: Self-pay | Admitting: Endocrinology

## 2017-11-18 VITALS — BP 118/70 | HR 75 | Ht 63.0 in | Wt 147.4 lb

## 2017-11-18 DIAGNOSIS — E1065 Type 1 diabetes mellitus with hyperglycemia: Secondary | ICD-10-CM

## 2017-11-18 DIAGNOSIS — E7800 Pure hypercholesterolemia, unspecified: Secondary | ICD-10-CM | POA: Diagnosis not present

## 2017-11-18 DIAGNOSIS — E039 Hypothyroidism, unspecified: Secondary | ICD-10-CM | POA: Diagnosis not present

## 2017-11-18 DIAGNOSIS — E038 Other specified hypothyroidism: Secondary | ICD-10-CM

## 2017-11-18 NOTE — Patient Instructions (Addendum)
Bolus 15 min before Bfst if able to   Carb ratio 1:7 at supper  Temp basal 150 at 11 am work days  Call Dr Gershon Crane

## 2017-11-18 NOTE — Progress Notes (Signed)
Patient ID: Felicia Gardner, female   DOB: June 20, 1961, 57 y.o.   MRN: [id number]           Reason for Appointment : Follow-up for Type 1 Diabetes  History of Present Illness          Diagnosis: Type 1 diabetes mellitus, date of diagnosis: 09/03/15        Diabetes  history:    She presented to the emergency room with weight loss, increased thirst and urination and had marked hyperglycemia  Labs in the emergency room showed >80 ketones in the urine and CO2 down to 15 On her initial consultation she was started on mealtime insulin with NovoLog and Lantus dose was increased to 12 units  RECENT history:    INSULIN regimen is: Medtronic 670 pump since 12/09/16  Basal rate midnight = 0.40, 6 AM = 0.45, 8 AM = 0.35, 6 AM = 0.4 : Bolus settings carbohydrate coverage 1: 7 at breakfast and 1: 8 at supper and 1: 10 at lunch  Correction 1:50 and target 120, active insulin time 3  hours   Her A1C is still not controlled, now 7.9 and same as before  Her blood sugar patterns over the last 2 weeks evaluated by download of her pump with Medtronic sensor, management and problems as follows:  She has had the auto mode 99 % of the time with the average insulin dose 33 units, 45 % is bolus   Her carbohydrate ratio was adjusted and she is getting more insulin to cover her lunch with better results now  However HYPERGLYCEMIA is still occurring mostly in the mid afternoon  She also tends to have relatively high readings late at night even though her blood sugars may not be high right after eating, sometimes this may be related to snacks, usually not having high fat meals in the evening.  However POSTPRANDIAL reading right after dinner tends to be low normal or flat  Blood sugars are frequently going up fairly quickly after her breakfast meal but usually come down fairly quickly also even though she is trying to get some peanut butter for protein in the morning, she is did not doing much physical activity  after breakfast  She again tends to have a feeling of hypoglycemia before lunchtime when she is working because of moving around more but does not know how to deal with this  She has significant HYPOGLYCEMIA in the early afternoon which is mostly after lunch  She has not had excessive variability but most variability in blood sugars is in the morning hours after 9 AM; this is mostly because of significant rise in blood sugar after breakfast on a couple of occasions  Has better control of her blood sugars with bolusing for her coffee in the morning using about 10 g of carbohydrate  HYPOGLYCEMIA is relatively mild but may occur right before lunchtime and once right after supper  CGM use % of time  92  Average and SD  154+/-47  Time in range     72   %  % Time Above 180  27  % Time above 250  4  % Time Below target  1   Glucose management indicator: %  Mean values apply above for all meters except median for One Touch  PRE-MEAL Fasting Lunch Dinner Bedtime Overall  Glucose range:       Mean/median:  140  148  138   154   POST-MEAL PC Breakfast PC Lunch PC  Dinner  Glucose range:     Mean/median:  184  188  169      Self-care: The diet that the patient has been following is: generally low fat and moderate in carbohydrates   She is eating Breakfast at 8 am Mostly Peanut butter crackers with coffee; dinner at 6-6:30 pm           Exercise: No formal exercise, she is generally more active at work around midday            Generally active on weekends around the house  CDE consultation: 3/17 and 7/18  Wt Readings from Last 3 Encounters:  11/18/17 147 lb 6.4 oz (66.9 kg)  08/19/17 146 lb 3.2 oz (66.3 kg)  05/18/17 143 lb 6.4 oz (65 kg)   Diabetes labs:  Lab Results  Component Value Date   HGBA1C 7.9 (H) 11/13/2017   HGBA1C 7.9 (H) 08/13/2017   HGBA1C 7.6 (H) 05/11/2017   Lab Results  Component Value Date   MICROALBUR 1.0 11/13/2017   LDLCALC 120 (H) 08/13/2017    CREATININE 0.75 11/13/2017    Lab Results  Component Value Date   MICRALBCREAT 0.6 11/13/2017   MICRALBCREAT 1.6 11/14/2016     Allergies as of 11/18/2017   No Known Allergies     Medication List        Accurate as of 11/18/17  8:44 PM. Always use your most recent med list.          Alpha Lipoic Acid 200 MG Caps Take by mouth daily.   blood glucose meter kit and supplies Kit Dispense based on patient and insurance preference. Use up to four times daily as directed. E11.9   Chromium Picolinate 1000 MCG Tabs Take by mouth.   Cinnamon 500 MG capsule Take 500 mg by mouth daily.   insulin lispro 100 UNIT/ML injection Commonly known as:  HUMALOG USE UP TO 50 UNITS DAILY PER INSULIN PUMP.   Insulin Pen Needle 32G X 5 MM Misc Use 4 per day to inject Insulin   LUTEIN 20 PO Take by mouth.   multivitamin with minerals Tabs tablet Take 1 tablet by mouth daily.   ONETOUCH DELICA LANCETS FINE Misc   pravastatin 20 MG tablet Commonly known as:  PRAVACHOL TAKE 1 TABLET (20 MG TOTAL) BY MOUTH DAILY.   vitamin B-12 1000 MCG tablet Commonly known as:  CYANOCOBALAMIN Take 1,000 mcg by mouth daily.   VITAMIN D-3 PO Take by mouth daily.       Allergies: No Known Allergies  Past Medical History:  Diagnosis Date  . Diabetes mellitus without complication ([place]) 09/100   diagnosed    Past Surgical History:  Procedure Laterality Date  . BUNIONECTOMY  2011   right foot  . TUBAL LIGATION  1990    Family History  Problem Relation Age of Onset  . Heart disease Mother   . Graves' disease Mother   . Thyroid disease Sister   . Thyroid disease Maternal Grandmother   . Diabetes Neg Hx   . Hypertension Father   . Crohn's disease Father   . Rheum arthritis Father   . Thyroid disease Sister     Social History:  reports that she has been smoking cigarettes.  She has a 41.00 pack-year smoking history. She has never used smokeless tobacco. She reports that she does not  drink alcohol or use drugs.    Review of Systems       Lipids: She has been on  pravastatin 20, Prescribed By PCP Last LDL is most recently higher than target at 134 and being consistently high     Lab Results  Component Value Date   CHOL 214 (H) 08/13/2017   HDL 74.30 08/13/2017   LDLCALC 120 (H) 08/13/2017   LDLDIRECT 134.0 11/13/2017   TRIG 101.0 08/13/2017   CHOLHDL 3 08/13/2017        Thyroid:   In the past she had TSH done because of fatigue and weight loss which occurred for other reasons Had a transient increase in TSH in 3/17, back to normal in 9/17 TSH has been upper normal but consistent   She has a family history of thyroid disease   Lab Results  Component Value Date   TSH 3.98 08/13/2017   TSH 4.31 11/14/2016   TSH 1.40 03/13/2016   FREET4 0.59 (L) 08/13/2017   FREET4 0.58 (L) 03/13/2016     Physical Examination:  BP 118/70 (BP Location: Left Arm, Patient Position: Sitting, Cuff Size: Normal)   Pulse 75   Ht 5' 3"  (1.6 m)   Wt 147 lb 6.4 oz (66.9 kg)   SpO2 97%   BMI 26.11 kg/m           ASSESSMENT:  Diabetes type 1, date of diagnosis 2017  See history of present illness for detailed discussion of current diabetes management, blood sugar patterns and problems identified  Her blood sugar and daily management was reviewed in detail from her download analysis  Her A1c is still high at 7.9 again This is likely to be from tendency to blood sugar spikes after meals particularly breakfast and sometimes lunch After breakfast blood sugar is going up because of decreased insulin sensitivity and delayed onset of action of her Humalog insulin Some readings are high because of not bolusing ahead of time before eating  Also has delayed hyperglycemia after evening meals either because of higher fat intake or elevation of the basal by a relatively high bolus causing 1 hour reading to be low normal Active insulin time is still appropriate at 3  hours  Hypoglycemia tendency is mostly related to increased physical activity before lunch and does not use temporary target for this  Microalbumin normal  LIPIDS: Has not seen her PCP yet for discussion of her last LDL of 120 previously and now 134 with low-dose pravastatin  PLAN:   Her carbohydrate ratio will be changed at dinnertime to 1: 10 also She will try to bolus 15 minutes before eating breakfast unless blood sugars are low normal Consistent bolusing before eating lunch and dinner also She will use a temporary target of 150 around 11 AM until lunchtime to reduce tendency to hypoglycemia Consistent use of protein at breakfast time Bolus for all snacks eating after dinner Lunchtime and she will take 1: 10 ratio instead of 13   Needs to schedule her eye exam and again reminded her, needs baseline exam even though she has had only diabetes for 2 years  LIPIDS: She needs a needs 80 mg of pravastatin or a more potent statin because of her LDL being consistently over 100 She will discuss with PCP  High normal TSH: Will repeat on next visit  Counseling time on subjects discussed in assessment and plan sections is over 50% of today's 25 minute visit   Patient Instructions  Bolus 15 min before Bfst if able to   Carb ratio 1:7 at supper  Temp basal 150 at 11 am work days  Call Dr  Donata Clay 11/18/2017, 8:44 PM   Note: This note was prepared with Dragon voice recognition system technology. Any transcriptional errors that result from this process are unintentional.

## 2017-11-29 DIAGNOSIS — E1065 Type 1 diabetes mellitus with hyperglycemia: Secondary | ICD-10-CM | POA: Diagnosis not present

## 2017-11-29 DIAGNOSIS — E109 Type 1 diabetes mellitus without complications: Secondary | ICD-10-CM | POA: Diagnosis not present

## 2017-11-29 DIAGNOSIS — Z794 Long term (current) use of insulin: Secondary | ICD-10-CM | POA: Diagnosis not present

## 2017-12-29 DIAGNOSIS — Z794 Long term (current) use of insulin: Secondary | ICD-10-CM | POA: Diagnosis not present

## 2017-12-29 DIAGNOSIS — E1065 Type 1 diabetes mellitus with hyperglycemia: Secondary | ICD-10-CM | POA: Diagnosis not present

## 2017-12-29 DIAGNOSIS — E109 Type 1 diabetes mellitus without complications: Secondary | ICD-10-CM | POA: Diagnosis not present

## 2018-01-29 DIAGNOSIS — E109 Type 1 diabetes mellitus without complications: Secondary | ICD-10-CM | POA: Diagnosis not present

## 2018-01-29 DIAGNOSIS — Z794 Long term (current) use of insulin: Secondary | ICD-10-CM | POA: Diagnosis not present

## 2018-01-29 DIAGNOSIS — E1065 Type 1 diabetes mellitus with hyperglycemia: Secondary | ICD-10-CM | POA: Diagnosis not present

## 2018-02-01 DIAGNOSIS — E1065 Type 1 diabetes mellitus with hyperglycemia: Secondary | ICD-10-CM | POA: Diagnosis not present

## 2018-02-01 DIAGNOSIS — E109 Type 1 diabetes mellitus without complications: Secondary | ICD-10-CM | POA: Diagnosis not present

## 2018-02-01 DIAGNOSIS — Z794 Long term (current) use of insulin: Secondary | ICD-10-CM | POA: Diagnosis not present

## 2018-02-15 ENCOUNTER — Other Ambulatory Visit (INDEPENDENT_AMBULATORY_CARE_PROVIDER_SITE_OTHER): Payer: BLUE CROSS/BLUE SHIELD

## 2018-02-15 DIAGNOSIS — E1065 Type 1 diabetes mellitus with hyperglycemia: Secondary | ICD-10-CM | POA: Diagnosis not present

## 2018-02-15 LAB — HEMOGLOBIN A1C: Hgb A1c MFr Bld: 8 % — ABNORMAL HIGH (ref 4.6–6.5)

## 2018-02-15 LAB — GLUCOSE, RANDOM: GLUCOSE: 168 mg/dL — AB (ref 70–99)

## 2018-02-17 ENCOUNTER — Other Ambulatory Visit: Payer: Self-pay | Admitting: Endocrinology

## 2018-02-18 ENCOUNTER — Encounter: Payer: Self-pay | Admitting: Endocrinology

## 2018-02-18 ENCOUNTER — Ambulatory Visit: Payer: BLUE CROSS/BLUE SHIELD | Admitting: Endocrinology

## 2018-02-18 VITALS — BP 120/82 | HR 92 | Ht 62.0 in | Wt 144.0 lb

## 2018-02-18 DIAGNOSIS — E1065 Type 1 diabetes mellitus with hyperglycemia: Secondary | ICD-10-CM

## 2018-02-18 DIAGNOSIS — E039 Hypothyroidism, unspecified: Secondary | ICD-10-CM

## 2018-02-18 DIAGNOSIS — E038 Other specified hypothyroidism: Secondary | ICD-10-CM

## 2018-02-18 DIAGNOSIS — E7800 Pure hypercholesterolemia, unspecified: Secondary | ICD-10-CM

## 2018-02-18 NOTE — Progress Notes (Signed)
Patient ID: Felicia Gardner, female   DOB: 1961-05-23, 57 y.o.   MRN: [id number]           Reason for Appointment : Follow-up for Type 1 Diabetes  History of Present Illness          Diagnosis: Type 1 diabetes mellitus, date of diagnosis: 09/03/15        Diabetes  history:    She presented to the emergency room with weight loss, increased thirst and urination and had marked hyperglycemia  Labs in the emergency room showed >80 ketones in the urine and CO2 down to 15 On her initial consultation she was started on mealtime insulin with NovoLog and Lantus dose was increased to 12 units  RECENT history:    INSULIN regimen is: Medtronic 670 pump since 12/09/16  Basal rate midnight = 0.40, 6 AM = 0.45, 8 AM = 0.35, 6 AM = 0.4 : Bolus settings carbohydrate coverage 1: 7 at breakfast and 1: 7 at supper and 1: 10 at lunch  Correction 1:50 and target 120, active insulin time 3  hours  Her A1C is still not controlled, now 8% compared to 7.9   Her blood sugar patterns over the last 2 weeks evaluated by download of her pump with Medtronic sensor, management and problems are as follows:  She has had the auto mode 99 % of the time with the average insulin dose 33 units, 45 % is bolus amount   Despite her A1c being high her average blood sugar is still the same on this visit at 153 on her sensor compared to her last visit  Again she has 74% of her readings within the target  HYPERGLYCEMIA is occurring to variable degrees after meals but has more consistently high readings late in the evening overall  She is trying to bolus 15 minutes before her breakfast in the morning with some benefit  Also with covering her coffee in the morning with 10 g of carbohydrates input she has good readings  Although overall her postprandial readings tend to go up after breakfast and evening meal more consistently this is not consistent and quite variable from day-to-day  However she may tend to be low normal  before suppertime  Previously was advised to do a temporary target of 150 when she is more active at work before lunchtime and she thinks this works better to avoid low sugars  HYPOGLYCEMIA has occurred occasionally right before suppertime and once late at night  Does not appear to have overcorrection of high readings from her boluses   CGM use % of time  94  Average and SD  153+/-52  Time in range     74   %  % Time Above 180  20  % Time above 250  5   % Time Below target 1    PRE-MEAL Fasting Lunch Dinner Bedtime Overall  Glucose range:       Mean/median:  133  160  139   153   POST-MEAL PC Breakfast PC Lunch PC Dinner  Glucose range:     Mean/median:  195  200  180    PREVIOUS readings   PRE-MEAL Fasting Lunch Dinner Bedtime Overall  Glucose range:       Mean/median:  140  148  138   154   POST-MEAL PC Breakfast PC Lunch PC Dinner  Glucose range:     Mean/median:  184  188  169      Self-care: The diet  that the patient has been following is: generally low fat and moderate in carbohydrates   She is eating Breakfast at 8 am Mostly Peanut butter crackers with coffee; dinner at 6-6:30 pm           Exercise: No formal exercise, she is generally more active at work around midday            Generally active on weekends around the house  CDE consultation: 3/17 and 7/18  Wt Readings from Last 3 Encounters:  02/18/18 144 lb (65.3 kg)  11/18/17 147 lb 6.4 oz (66.9 kg)  08/19/17 146 lb 3.2 oz (66.3 kg)   Diabetes labs:  Lab Results  Component Value Date   HGBA1C 8.0 (H) 02/15/2018   HGBA1C 7.9 (H) 11/13/2017   HGBA1C 7.9 (H) 08/13/2017   Lab Results  Component Value Date   MICROALBUR 1.0 11/13/2017   LDLCALC 120 (H) 08/13/2017   CREATININE 0.75 11/13/2017    Lab Results  Component Value Date   MICRALBCREAT 0.6 11/13/2017   MICRALBCREAT 1.6 11/14/2016     Allergies as of 02/18/2018   No Known Allergies     Medication List        Accurate as of  02/18/18  3:53 PM. Always use your most recent med list.          Alpha Lipoic Acid 200 MG Caps Take by mouth daily.   blood glucose meter kit and supplies Kit Dispense based on patient and insurance preference. Use up to four times daily as directed. E11.9   Chromium Picolinate 1000 MCG Tabs Take by mouth.   Cinnamon 500 MG capsule Take 500 mg by mouth daily.   insulin lispro 100 UNIT/ML injection Commonly known as:  HUMALOG USE UP TO 50 UNITS DAILY PER INSULIN PUMP.   Insulin Pen Needle 32G X 5 MM Misc Use 4 per day to inject Insulin   LUTEIN 20 PO Take by mouth.   multivitamin with minerals Tabs tablet Take 1 tablet by mouth daily.   ONETOUCH DELICA LANCETS FINE Misc   pravastatin 20 MG tablet Commonly known as:  PRAVACHOL TAKE 1 TABLET (20 MG TOTAL) BY MOUTH DAILY.   vitamin B-12 1000 MCG tablet Commonly known as:  CYANOCOBALAMIN Take 1,000 mcg by mouth daily.   VITAMIN D-3 PO Take by mouth daily.       Allergies: No Known Allergies  Past Medical History:  Diagnosis Date  . Diabetes mellitus without complication ([place]) 10/275   diagnosed    Past Surgical History:  Procedure Laterality Date  . BUNIONECTOMY  2011   right foot  . TUBAL LIGATION  1990    Family History  Problem Relation Age of Onset  . Heart disease Mother   . Graves' disease Mother   . Thyroid disease Sister   . Thyroid disease Maternal Grandmother   . Diabetes Neg Hx   . Hypertension Father   . Crohn's disease Father   . Rheum arthritis Father   . Thyroid disease Sister     Social History:  reports that she has been smoking cigarettes. She has a 41.00 pack-year smoking history. She has never used smokeless tobacco. She reports that she does not drink alcohol or use drugs.    Review of Systems       Lipids: She has been on pravastatin 20, Prescribed By PCP Last LDL is most recently higher than target of 100   Lab Results  Component Value Date   CHOL 214 (  H)  08/13/2017   HDL 74.30 08/13/2017   LDLCALC 120 (H) 08/13/2017   LDLDIRECT 134.0 11/13/2017   TRIG 101.0 08/13/2017   CHOLHDL 3 08/13/2017        Thyroid:   In the past she had TSH done because of fatigue and weight loss which occurred for other reasons Had a transient increase in TSH in 3/17, back to normal in 9/17 TSH has been upper normal previously  She has a family history of thyroid disease   Lab Results  Component Value Date   TSH 3.98 08/13/2017   TSH 4.31 11/14/2016   TSH 1.40 03/13/2016   FREET4 0.59 (L) 08/13/2017   FREET4 0.58 (L) 03/13/2016     Physical Examination:  BP 120/82   Pulse 92   Ht _0  (1.575 m)   Wt 144 lb (65.3 kg)   SpO2 95%   BMI 26.34 kg/m           ASSESSMENT:  Diabetes type 1, date of diagnosis 2017  See history of present illness for detailed discussion of current diabetes management, blood sugar patterns and problems identified  Her blood sugar and daily management was reviewed in detail from her download analysis  Her A1c is still high at 8%  This is still higher than expected for her home blood sugars averaging about 153 as before  Although she does have periods of blood sugar spikes especially after meals this is not consistent especially in the last week or so Some of her high sugars in the mornings may be since she is not as active at work recently However blood sugars may tend to be higher after supper overall  Currently her active insulin time is 3 hours and she may tend to be low normal before the next meal possibly from action of her basal insulin but this is not totally clear Usually nocturnal blood sugars are fairly good less she has done a correction late at night causing low normal readings Overall hypoglycemia has been better managed with using temporary target of 150 when she is more active at work  LIPIDS: Needs follow-up and probably more aggressive treatment  PLAN:   Her carbohydrate ratio will be  changed 1: 6 at suppertime Active insulin 3-1/2 hours  High normal TSH: Will repeat on next visit  Counseling time on subjects discussed in assessment and plan sections is over 50% of today's 25 minute visit   There are no Patient Instructions on file for this visit.     Elayne Snare 02/18/2018, 3:53 PM   Note: This note was prepared with Dragon voice recognition system technology. Any transcriptional errors that result from this process are unintentional.

## 2018-02-19 ENCOUNTER — Encounter: Payer: Self-pay | Admitting: Endocrinology

## 2018-03-01 DIAGNOSIS — E109 Type 1 diabetes mellitus without complications: Secondary | ICD-10-CM | POA: Diagnosis not present

## 2018-03-01 DIAGNOSIS — Z794 Long term (current) use of insulin: Secondary | ICD-10-CM | POA: Diagnosis not present

## 2018-03-01 DIAGNOSIS — E1065 Type 1 diabetes mellitus with hyperglycemia: Secondary | ICD-10-CM | POA: Diagnosis not present

## 2018-03-31 DIAGNOSIS — E109 Type 1 diabetes mellitus without complications: Secondary | ICD-10-CM | POA: Diagnosis not present

## 2018-03-31 DIAGNOSIS — E1065 Type 1 diabetes mellitus with hyperglycemia: Secondary | ICD-10-CM | POA: Diagnosis not present

## 2018-03-31 DIAGNOSIS — Z794 Long term (current) use of insulin: Secondary | ICD-10-CM | POA: Diagnosis not present

## 2018-05-03 DIAGNOSIS — Z794 Long term (current) use of insulin: Secondary | ICD-10-CM | POA: Diagnosis not present

## 2018-05-03 DIAGNOSIS — E1065 Type 1 diabetes mellitus with hyperglycemia: Secondary | ICD-10-CM | POA: Diagnosis not present

## 2018-05-03 DIAGNOSIS — E109 Type 1 diabetes mellitus without complications: Secondary | ICD-10-CM | POA: Diagnosis not present

## 2018-05-24 ENCOUNTER — Other Ambulatory Visit (INDEPENDENT_AMBULATORY_CARE_PROVIDER_SITE_OTHER): Payer: BLUE CROSS/BLUE SHIELD

## 2018-05-24 DIAGNOSIS — E039 Hypothyroidism, unspecified: Secondary | ICD-10-CM | POA: Diagnosis not present

## 2018-05-24 DIAGNOSIS — E038 Other specified hypothyroidism: Secondary | ICD-10-CM

## 2018-05-24 DIAGNOSIS — E1065 Type 1 diabetes mellitus with hyperglycemia: Secondary | ICD-10-CM | POA: Diagnosis not present

## 2018-05-24 DIAGNOSIS — E7800 Pure hypercholesterolemia, unspecified: Secondary | ICD-10-CM

## 2018-05-24 LAB — COMPREHENSIVE METABOLIC PANEL
ALT: 11 U/L (ref 0–35)
AST: 15 U/L (ref 0–37)
Albumin: 4.3 g/dL (ref 3.5–5.2)
Alkaline Phosphatase: 95 U/L (ref 39–117)
BILIRUBIN TOTAL: 0.3 mg/dL (ref 0.2–1.2)
BUN: 12 mg/dL (ref 6–23)
CO2: 31 meq/L (ref 19–32)
CREATININE: 0.96 mg/dL (ref 0.40–1.20)
Calcium: 9.7 mg/dL (ref 8.4–10.5)
Chloride: 102 mEq/L (ref 96–112)
GFR: 63.23 mL/min (ref >60.00)
GLUCOSE: 307 mg/dL — AB (ref 70–99)
Potassium: 4.4 mEq/L (ref 3.5–5.1)
SODIUM: 139 meq/L (ref 135–145)
Total Protein: 6.8 g/dL (ref 6.0–8.3)

## 2018-05-24 LAB — LIPID PANEL
CHOL/HDL RATIO: 3
Cholesterol: 254 mg/dL — ABNORMAL HIGH (ref 0–200)
HDL: 74.2 mg/dL (ref >39.00)
LDL Cholesterol: 149 mg/dL — ABNORMAL HIGH (ref 0–99)
NONHDL: 179.94
Triglycerides: 156 mg/dL — ABNORMAL HIGH (ref 0.0–149.0)
VLDL: 31.2 mg/dL (ref 0.0–40.0)

## 2018-05-24 LAB — T4, FREE: FREE T4: 0.56 ng/dL — AB (ref 0.60–1.60)

## 2018-05-24 LAB — TSH: TSH: 4.25 u[IU]/mL (ref 0.35–4.50)

## 2018-05-24 LAB — HEMOGLOBIN A1C: Hgb A1c MFr Bld: 7.8 % — ABNORMAL HIGH (ref 4.6–6.5)

## 2018-05-28 ENCOUNTER — Encounter: Payer: Self-pay | Admitting: Endocrinology

## 2018-05-28 ENCOUNTER — Ambulatory Visit: Payer: BLUE CROSS/BLUE SHIELD | Admitting: Endocrinology

## 2018-05-28 VITALS — BP 118/70 | HR 86 | Ht 62.0 in | Wt 146.8 lb

## 2018-05-28 DIAGNOSIS — E1065 Type 1 diabetes mellitus with hyperglycemia: Secondary | ICD-10-CM

## 2018-05-28 DIAGNOSIS — E7800 Pure hypercholesterolemia, unspecified: Secondary | ICD-10-CM

## 2018-05-28 MED ORDER — ROSUVASTATIN CALCIUM 5 MG PO TABS: 5.0000 mg | ORAL_TABLET | Freq: Every day | ORAL | 3 refills | Status: DC

## 2018-05-28 NOTE — Progress Notes (Signed)
Patient ID: Felicia Gardner, female   DOB: 08/01/1960, 57 y.o.   MRN: [id number]           Reason for Appointment : Follow-up for Type 1 Diabetes  History of Present Illness          Diagnosis: Type 1 diabetes mellitus, date of diagnosis: 09/03/15        Diabetes  history:    She presented to the emergency room with weight loss, increased thirst and urination and had marked hyperglycemia  Labs in the emergency room showed >80 ketones in the urine and CO2 down to 15 On her initial consultation she was started on mealtime insulin with NovoLog and Lantus dose was increased to 12 units  RECENT history:    INSULIN regimen is: Medtronic 670 pump since 12/09/16  Basal rate midnight = 0.40, 6 AM = 0.45, 8 AM = 0.35, 6 AM = 0.4 : Bolus settings carbohydrate coverage 1: 7 at breakfast and 1: 7 at supper and 1: 10 at lunch  Correction 1:50 and target 120, active insulin time 3.5 hours  Her A1C is still not controlled, now 7.8 versus 8% before  Her blood sugar patterns over the last 2 weeks evaluated by download of her pump with Medtronic sensor, day-to-day bolus and diet management and problems identified are as follows:  She has had the auto mode 99 % of the time with the average insulin dose 33 units, 45 % is bolus amount   Despite her average blood sugar being about the same as before A1c is slightly better but still not at target  Also has less blood sugars within target  Her main difficulty is HYPERGLYCEMIA which is now more often after lunch  This is despite her trying to bolus before starting to eat usually and counting carbohydrates  HYPERGLYCEMIA appears to be occurring on the day she is changing her infusion set despite feeling her tubing before inserting it  HYPOGLYCEMIA has been minimal and transient  Detail patterns are discussed below  Does not appear to have overcorrection of high readings from her boluses except once after a relatively large bolus at dinnertime for  correction   CONTINUOUS GLUCOSE MONITORING RECORD INTERPRETATION    Dates of Recording: Last 2 weeks  Sensor description: Guardian  Results statistics:   CGM use % of time   Average and SD  161+/-59, was 153  Time in range       68 % versus 74  % Time Above 180  24  % Time above 250  7  % Time Below target 1    Glycemic patterns summary: Blood sugars are stable overnight, rising early morning and in the afternoons and generally averaging between 180-200 between 1:30 PM and 11 PM with significant variability  Hyperglycemic episodes are occurring after lunch, after dinner at times late in the evening and occasionally right before suppertime  Hypoglycemic episodes occurred minimally with transient low sugars at 1 PM, 4-5 PM and 7-8 PM  Overnight periods: Blood sugars are usually declining gradually through the night but rising after about 5 AM before her waking up time  Preprandial periods: Blood sugars are generally recorded near normal at breakfast time  Postprandial periods: After breakfast: Blood sugars are only minimally rising especially since she is not eating much carbohydrate in the morning except coffee After lunch: Blood sugars are usually rising, to a variable extent and duration After dinner: Blood sugars are over the target only occasionally with generally good readings  and mostly having difficulty on Thanksgiving weekend Her blood sugars tend to be rising at times after 3 hours especially at lunch and dinner   PRE-MEAL  AC breakfast Lunch Dinner Bedtime Overall  Glucose range:       Mean/median:  141  158 166     POST-MEAL PC Breakfast PC Lunch PC Dinner  Glucose range:     Mean/median:  165  243 194      Self-care: The diet that the patient has been following is: generally low fat and moderate in carbohydrates   She is eating Breakfast at 8 am Mostly Peanut butter crackers with coffee; dinner at 6-6:30 pm           Exercise: No formal exercise, she is  generally more active at work around midday before lunch            Generally active on weekends around the house  CDE consultation: 3/17 and 7/18  Wt Readings from Last 3 Encounters:  05/28/18 146 lb 12.8 oz (66.6 kg)  02/18/18 144 lb (65.3 kg)  11/18/17 147 lb 6.4 oz (66.9 kg)   Diabetes labs:  Lab Results  Component Value Date   HGBA1C 7.8 (H) 05/24/2018   HGBA1C 8.0 (H) 02/15/2018   HGBA1C 7.9 (H) 11/13/2017   Lab Results  Component Value Date   MICROALBUR 1.0 11/13/2017   LDLCALC 149 (H) 05/24/2018   CREATININE 0.96 05/24/2018    Lab Results  Component Value Date   MICRALBCREAT 0.6 11/13/2017   MICRALBCREAT 1.6 11/14/2016     Allergies as of 05/28/2018   No Known Allergies     Medication List        Accurate as of 05/28/18  3:09 PM. Always use your most recent med list.          Alpha Lipoic Acid 200 MG Caps Take by mouth daily.   blood glucose meter kit and supplies Kit Dispense based on patient and insurance preference. Use up to four times daily as directed. E11.9   Chromium Picolinate 1000 MCG Tabs Take by mouth.   Cinnamon 500 MG capsule Take 500 mg by mouth daily.   insulin lispro 100 UNIT/ML injection Commonly known as:  HUMALOG USE UP TO 50 UNITS DAILY PER INSULIN PUMP.   Insulin Pen Needle 32G X 5 MM Misc Use 4 per day to inject Insulin   LUTEIN 20 PO Take by mouth.   multivitamin with minerals Tabs tablet Take 1 tablet by mouth daily.   ONETOUCH DELICA LANCETS FINE Misc   pravastatin 20 MG tablet Commonly known as:  PRAVACHOL TAKE 1 TABLET (20 MG TOTAL) BY MOUTH DAILY.   vitamin B-12 1000 MCG tablet Commonly known as:  CYANOCOBALAMIN Take 1,000 mcg by mouth daily.   VITAMIN D-3 PO Take by mouth daily.       Allergies: No Known Allergies  Past Medical History:  Diagnosis Date  . Diabetes mellitus without complication ([place]) 07/3298   diagnosed    Past Surgical History:  Procedure Laterality Date  . BUNIONECTOMY   2011   right foot  . TUBAL LIGATION  1990    Family History  Problem Relation Age of Onset  . Heart disease Mother   . Graves' disease Mother   . Thyroid disease Sister   . Thyroid disease Maternal Grandmother   . Diabetes Neg Hx   . Hypertension Father   . Crohn's disease Father   . Rheum arthritis Father   . Thyroid  disease Sister     Social History:  reports that she has been smoking cigarettes. She has a 41.00 pack-year smoking history. She has never used smokeless tobacco. She reports that she does not drink alcohol or use drugs.    Review of Systems       Lipids: She has previously been on pravastatin 20, Prescribed By PCP Last LDL is consistently higher than target of 100 She ran out of her prescription and has not called for refill  Lab Results  Component Value Date   CHOL 254 (H) 05/24/2018   HDL 74.20 05/24/2018   LDLCALC 149 (H) 05/24/2018   LDLDIRECT 134.0 11/13/2017   TRIG 156.0 (H) 05/24/2018   CHOLHDL 3 05/24/2018        Thyroid:   In the past she had TSH done because of fatigue and weight loss which occurred for other reasons Had a transient increase in TSH in 3/17, back to normal subsequently TSH has been upper normal and about the same  She has a family history of thyroid disease   Lab Results  Component Value Date   TSH 4.25 05/24/2018   TSH 3.98 08/13/2017   TSH 4.31 11/14/2016   FREET4 0.56 (L) 05/24/2018   FREET4 0.59 (L) 08/13/2017   FREET4 0.58 (L) 03/13/2016     Physical Examination:  BP 118/70 (BP Location: Left Arm, Patient Position: Sitting, Cuff Size: Normal)   Pulse 86   Ht 5' 2"  (1.575 m)   Wt 146 lb 12.8 oz (66.6 kg)   SpO2 92%   BMI 26.85 kg/m           ASSESSMENT:  Diabetes type 1, date of diagnosis 2017  See history of present illness for detailed discussion of current diabetes management, blood sugar patterns and problems identified  Her blood sugar and daily management was reviewed in detail from her  download analysis  Her A1c is still high at 7.8%  She has somewhat inconsistent control especially after meals Most of her hyperglycemia is after lunch Also she has difficulty with rising blood sugar on the day she is changing her infusion set and not clear why Her bolus timing and duration of effect of insulin appears to be accurate However blood sugars tend to be rising at times after 3 hours   LIPIDS: Needs consistent statin treatment and will need LDL to be below 70, currently off her pravastatin    PLAN:   Her carbohydrate ratio will be changed 1: 8 at lunchtime, continue 1: 6 at suppertime Active insulin 3 hours and 15 minutes She will discuss issues with her hyperglycemia on the day she is changing her infusion set May need to bolus little longer before her meals for preventing rapid hyperglycemia and also consider Fiasp if covered next year   LIPIDS: Start Crestor 5 mg daily for more effective control  Counseling time on subjects discussed in assessment and plan sections is over 50% of today's 25 minute visit   There are no Patient Instructions on file for this visit.     Elayne Snare 05/28/2018, 3:09 PM   Note: This note was prepared with Dragon voice recognition system technology. Any transcriptional errors that result from this process are unintentional.

## 2018-05-30 ENCOUNTER — Telehealth: Payer: Self-pay | Admitting: Endocrinology

## 2018-05-30 NOTE — Telephone Encounter (Signed)
Blood sugars are high on the day she is changing her infusion set, please review her procedures.  She is using 4.7 units to fill her tubing but none recorded for her cannula

## 2018-06-01 ENCOUNTER — Telehealth: Payer: Self-pay | Admitting: Nutrition

## 2018-06-01 NOTE — Telephone Encounter (Signed)
Patient reports that she is using a needle infusion set-which requires no "fill cannula", amounts, but is using only her abdominal area.  She is also priming her tubing before putting the cartridge into the pump, which gives her more insulin to use in the cartridge.  All of these things should not give high readings after the needle insertion for several hours.  She was told to change her insertion sites to her upper buttocks area and to let me know if this willl prevent the blood sugars from going high.  She agreed to do this and let me know.  If this does not, I told her that I would like to watch her do a cartridge/infusion set change, to see if I see something, that might be causing this to happen.  She agreed to this course of action.

## 2018-06-01 NOTE — Telephone Encounter (Signed)
Message left on home phone to call me to discuss high blood sugar readings after changing her infusion set

## 2018-06-02 DIAGNOSIS — E109 Type 1 diabetes mellitus without complications: Secondary | ICD-10-CM | POA: Diagnosis not present

## 2018-06-02 DIAGNOSIS — E1065 Type 1 diabetes mellitus with hyperglycemia: Secondary | ICD-10-CM | POA: Diagnosis not present

## 2018-06-02 DIAGNOSIS — Z794 Long term (current) use of insulin: Secondary | ICD-10-CM | POA: Diagnosis not present

## 2018-07-05 DIAGNOSIS — E1065 Type 1 diabetes mellitus with hyperglycemia: Secondary | ICD-10-CM | POA: Diagnosis not present

## 2018-07-05 DIAGNOSIS — E109 Type 1 diabetes mellitus without complications: Secondary | ICD-10-CM | POA: Diagnosis not present

## 2018-07-05 DIAGNOSIS — Z794 Long term (current) use of insulin: Secondary | ICD-10-CM | POA: Diagnosis not present

## 2018-07-17 ENCOUNTER — Other Ambulatory Visit: Payer: Self-pay | Admitting: Endocrinology

## 2018-08-05 DIAGNOSIS — E1065 Type 1 diabetes mellitus with hyperglycemia: Secondary | ICD-10-CM | POA: Diagnosis not present

## 2018-08-05 DIAGNOSIS — E109 Type 1 diabetes mellitus without complications: Secondary | ICD-10-CM | POA: Diagnosis not present

## 2018-08-05 DIAGNOSIS — Z794 Long term (current) use of insulin: Secondary | ICD-10-CM | POA: Diagnosis not present

## 2018-08-30 ENCOUNTER — Other Ambulatory Visit (INDEPENDENT_AMBULATORY_CARE_PROVIDER_SITE_OTHER): Payer: BLUE CROSS/BLUE SHIELD

## 2018-08-30 ENCOUNTER — Other Ambulatory Visit: Payer: Self-pay

## 2018-08-30 DIAGNOSIS — E7800 Pure hypercholesterolemia, unspecified: Secondary | ICD-10-CM | POA: Diagnosis not present

## 2018-08-30 DIAGNOSIS — E1065 Type 1 diabetes mellitus with hyperglycemia: Secondary | ICD-10-CM | POA: Diagnosis not present

## 2018-08-30 LAB — LIPID PANEL
Cholesterol: 170 mg/dL (ref 0–200)
HDL: 72.7 mg/dL (ref >39.00)
LDL Cholesterol: 77 mg/dL (ref 0–99)
NonHDL: 97.26
Total CHOL/HDL Ratio: 2
Triglycerides: 101 mg/dL (ref 0.0–149.0)
VLDL: 20.2 mg/dL (ref 0.0–40.0)

## 2018-08-30 LAB — COMPREHENSIVE METABOLIC PANEL
ALT: 14 U/L (ref 0–35)
AST: 18 U/L (ref 0–37)
Albumin: 4.1 g/dL (ref 3.5–5.2)
Alkaline Phosphatase: 90 U/L (ref 39–117)
BILIRUBIN TOTAL: 0.3 mg/dL (ref 0.2–1.2)
BUN: 12 mg/dL (ref 6–23)
CO2: 30 mEq/L (ref 19–32)
CREATININE: 0.78 mg/dL (ref 0.40–1.20)
Calcium: 9.3 mg/dL (ref 8.4–10.5)
Chloride: 104 mEq/L (ref 96–112)
GFR: 75.53 mL/min (ref >60.00)
Glucose, Bld: 137 mg/dL — ABNORMAL HIGH (ref 70–99)
POTASSIUM: 4.2 meq/L (ref 3.5–5.1)
Sodium: 141 mEq/L (ref 135–145)
Total Protein: 6.5 g/dL (ref 6.0–8.3)

## 2018-08-30 LAB — HEMOGLOBIN A1C: Hgb A1c MFr Bld: 7.7 % — ABNORMAL HIGH (ref 4.6–6.5)

## 2018-09-02 ENCOUNTER — Ambulatory Visit: Payer: BLUE CROSS/BLUE SHIELD | Admitting: Endocrinology

## 2018-09-02 ENCOUNTER — Encounter: Payer: Self-pay | Admitting: Endocrinology

## 2018-09-02 ENCOUNTER — Other Ambulatory Visit: Payer: Self-pay

## 2018-09-02 VITALS — BP 120/78 | HR 78 | Ht 62.0 in | Wt 143.0 lb

## 2018-09-02 DIAGNOSIS — E1065 Type 1 diabetes mellitus with hyperglycemia: Secondary | ICD-10-CM | POA: Diagnosis not present

## 2018-09-02 DIAGNOSIS — E7800 Pure hypercholesterolemia, unspecified: Secondary | ICD-10-CM | POA: Diagnosis not present

## 2018-09-02 NOTE — Progress Notes (Signed)
Patient ID: Felicia Gardner, female   DOB: [birth year], 58 y.o.   MRN: [id number]           Reason for Appointment : Follow-up for Type 1 Diabetes  History of Present Illness          Diagnosis: Type 1 diabetes mellitus, date of diagnosis: 09/03/15        Diabetes  history:    She presented to the emergency room with weight loss, increased thirst and urination and had marked hyperglycemia  Labs in the emergency room showed >80 ketones in the urine and CO2 down to 15 On her initial consultation she was started on mealtime insulin with NovoLog and Lantus dose was increased to 12 units  RECENT history:    INSULIN regimen is: Medtronic 670 pump since 12/09/16  Basal rate midnight = 0.40, 6 AM = 0.45, 8 AM = 0.35, 6 AM = 0.4 : Bolus settings carbohydrate coverage 1: 7 at breakfast and 1:6  at supper and 1: 8 at lunch  Correction 1:50 and target 120, active insulin time 3.5 hours  Her A1C is still relatively high at 7.7  Her blood sugar patterns over the last 2 weeks evaluated by download of her pump with Medtronic sensor, day-to-day bolus and diet management and problems identified are as follows:  She has had the auto mode 98 % of the time with the average insulin dose 40 units, 42 % is bolus amount   She has usually fairly good blood sugars with some tendency to periodic mealtime hyperglycemia as discussed below  Although she tries to bolus before eating blood sugar may sometimes rise fairly rapidly  After her evening snack at 9 PM the last 2 days blood sugars have gone up and with correction doses tend to be low normal during the night  Also her basal rate does not appear to be acting fast enough for rising or falling blood sugars  Hypoglycemia has been relatively minimal but she feels blood sugar dropping before lunch when she is more active; has not tried the temporary target as discussed   CONTINUOUS GLUCOSE MONITORING RECORD INTERPRETATION    Dates of Recording: Last 2 weeks   Sensor description: Guardian  Results statistics:   CGM use % of time  92  Average and SD  157+/-55, was 161  Time in range    68    %  % Time Above 180  24  % Time above 250  6  % Time Below target  2    Glycemic patterns summary: Blood sugars show only mild variability with tendency to high readings at times after lunch and dinner and low normal readings before dinner Also blood sugars may tend to rebound if they are low normal   Hyperglycemic episodes are documented after lunch and dinner with more significant increase after evening meal  Hypoglycemic episodes occurred only twice, once around 3-4 AM and another at about noon  Overnight periods: Blood sugars are starting off with more variability at midnight but then more consistent before waking up time with fairly stable levels  Preprandial periods: He does not eat breakfast but usually waking up blood sugars are mildly increased reflecting some dawn phenomenon Also tends to have low normal sugars before lunchtime especially on weekdays Before dinner her blood sugars are on average near normal with only low reading of 60 once otherwise stable levels  Postprandial periods: After breakfast: She has only coffee at breakfast which does not appear to increase  blood sugar significantly After lunch: Blood sugars are variably higher but on an average rising modestly with POSTPRANDIAL average blood sugar 201  After dinner: Average blood sugar is 189 with more variability including some low normal and also high readings  Previous data:  CGM use % of time  92  Average and SD  161+/-59, was 153  Time in range       68 % versus 74  % Time Above 180  24  % Time above 250  7  % Time Below target 1      Self-care: The diet that the patient has been following is: generally low fat and moderate in carbohydrates   She is eating Breakfast at 8 am Mostly Peanut butter crackers with coffee; dinner at 6-6:30 pm           Exercise: No  formal exercise, she is generally more active at work around midday before lunch            Generally active on weekends around the house  CDE consultation: 3/17 and 7/18  Wt Readings from Last 3 Encounters:  09/02/18 143 lb (64.9 kg)  05/28/18 146 lb 12.8 oz (66.6 kg)  02/18/18 144 lb (65.3 kg)   Diabetes labs:  Lab Results  Component Value Date   HGBA1C 7.7 (H) 08/30/2018   HGBA1C 7.8 (H) 05/24/2018   HGBA1C 8.0 (H) 02/15/2018   Lab Results  Component Value Date   MICROALBUR 1.0 11/13/2017   LDLCALC 77 08/30/2018   CREATININE 0.78 08/30/2018    Lab Results  Component Value Date   MICRALBCREAT 0.6 11/13/2017   MICRALBCREAT 1.6 11/14/2016     Allergies as of 09/02/2018   No Known Allergies     Medication List       Accurate as of September 02, 2018  9:12 PM. Always use your most recent med list.        Alpha Lipoic Acid 200 MG Caps Take by mouth daily.   blood glucose meter kit and supplies Kit Dispense based on patient and insurance preference. Use up to four times daily as directed. E11.9   Chromium Picolinate 1000 MCG Tabs Take by mouth.   Cinnamon 500 MG capsule Take 500 mg by mouth daily.   insulin lispro 100 UNIT/ML injection Commonly known as:  HumaLOG USE UP TO 50 UNITS DAILY PER INSULIN PUMP.   Insulin Pen Needle 32G X 5 MM Misc Use 4 per day to inject Insulin   LUTEIN 20 PO Take by mouth.   multivitamin with minerals Tabs tablet Take 1 tablet by mouth daily.   OneTouch Delica Lancets Fine Misc   rosuvastatin 5 MG tablet Commonly known as:  Crestor Take 1 tablet (5 mg total) by mouth daily.   vitamin B-12 1000 MCG tablet Commonly known as:  CYANOCOBALAMIN Take 1,000 mcg by mouth daily.   VITAMIN D-3 PO Take by mouth daily.       Allergies: No Known Allergies  Past Medical History:  Diagnosis Date  . Diabetes mellitus without complication ([place]) 07/4095   diagnosed    Past Surgical History:  Procedure Laterality Date  .  BUNIONECTOMY  2011   right foot  . TUBAL LIGATION  1990    Family History  Problem Relation Age of Onset  . Heart disease Mother   . Graves' disease Mother   . Thyroid disease Sister   . Thyroid disease Maternal Grandmother   . Diabetes Neg Hx   . Hypertension  Father   . Crohn's disease Father   . Rheum arthritis Father   . Thyroid disease Sister     Social History:  reports that she has been smoking cigarettes. She has a 41.00 pack-year smoking history. She has never used smokeless tobacco. She reports that she does not drink alcohol or use drugs.    Review of Systems       Lipids: She has been prescribed Crestor because of poor control previously and not taking her pravastatin regularly LDL has come down to 77 from 149 Liver functions are normal  Lab Results  Component Value Date   CHOL 170 08/30/2018   CHOL 254 (H) 05/24/2018   CHOL 214 (H) 08/13/2017   Lab Results  Component Value Date   HDL 72.70 08/30/2018   HDL 74.20 05/24/2018   HDL 74.30 08/13/2017   Lab Results  Component Value Date   LDLCALC 77 08/30/2018   LDLCALC 149 (H) 05/24/2018   LDLCALC 120 (H) 08/13/2017   Lab Results  Component Value Date   TRIG 101.0 08/30/2018   TRIG 156.0 (H) 05/24/2018   TRIG 101.0 08/13/2017   Lab Results  Component Value Date   CHOLHDL 2 08/30/2018   CHOLHDL 3 05/24/2018   CHOLHDL 3 08/13/2017   Lab Results  Component Value Date   LDLDIRECT 134.0 11/13/2017         Thyroid:   In the past she had TSH done because of fatigue and weight loss which occurred for other reasons Had a transient increase in TSH in 3/17, back to normal subsequently TSH has been upper normal and about the same  She has a family history of thyroid disease   Lab Results  Component Value Date   TSH 4.25 05/24/2018   TSH 3.98 08/13/2017   TSH 4.31 11/14/2016   FREET4 0.56 (L) 05/24/2018   FREET4 0.59 (L) 08/13/2017   FREET4 0.58 (L) 03/13/2016     Physical Examination:   BP 120/78 (BP Location: Left Arm, Patient Position: Sitting, Cuff Size: Normal)   Pulse 78   Ht 5' 2"  (1.575 m)   Wt 143 lb (64.9 kg)   SpO2 97%   BMI 26.16 kg/m           ASSESSMENT:  Diabetes type 1, date of diagnosis 2017  See history of present illness for detailed discussion of current diabetes management, blood sugar patterns and problems identified  Her blood sugar and daily management was reviewed in detail from her download analysis  Her A1c is still high at 7.7%  This is despite her recent average blood sugar being 157 on her sensor Has been using the sensor and auto mode fairly consistently recently  With increasing her carbohydrate coverage on her last visit at lunchtime blood sugars are not quite as high but still periodically spiking Also occasionally has spikes after evening meal May not be always bolusing ahead of time for meals which may help She is getting more busy at work before lunch with physical activity and this causes her to feel slightly hypoglycemic although no hypoglycemia documented May also be getting some overcorrection of high readings late at night Discussed her day-to-day patterns, hyperglycemia and hypoglycemia causes and how to manage these  LIPIDS: Well controlled with Crestor currently She is tolerating this well also   PLAN:   Her carbohydrate ratio will be changed at suppertime to 1: 5 but she will use 1: 7 for snack after 8 PM May consider further increasing coverage  at lunchtime However she will try to first prevent tendency to low normal sugars before lunch by using a temporary target of 150, this will prevent any inhibition of her basal activity before lunch May need to bolus little longer before her meals for better postprandial control No change in sensitivity during the day about 1: 60 after 8 PM Consider Fiasp   LIPIDS: Continue Crestor 5 mg daily for consistent control  Counseling time on subjects discussed in assessment  and plan sections is over 50% of today's 25 minute visit   There are no Patient Instructions on file for this visit.     Elayne Snare 09/02/2018, 9:12 PM   Note: This note was prepared with Dragon voice recognition system technology. Any transcriptional errors that result from this process are unintentional.

## 2018-09-03 DIAGNOSIS — E1065 Type 1 diabetes mellitus with hyperglycemia: Secondary | ICD-10-CM | POA: Diagnosis not present

## 2018-09-03 DIAGNOSIS — Z794 Long term (current) use of insulin: Secondary | ICD-10-CM | POA: Diagnosis not present

## 2018-09-03 DIAGNOSIS — E109 Type 1 diabetes mellitus without complications: Secondary | ICD-10-CM | POA: Diagnosis not present

## 2018-09-15 ENCOUNTER — Other Ambulatory Visit: Payer: Self-pay

## 2018-09-15 ENCOUNTER — Encounter: Payer: Self-pay | Admitting: Emergency Medicine

## 2018-09-15 ENCOUNTER — Ambulatory Visit: Payer: BLUE CROSS/BLUE SHIELD | Admitting: Emergency Medicine

## 2018-09-15 VITALS — BP 136/70 | HR 93 | Temp 98.4°F | Resp 16 | Ht 62.0 in | Wt 140.2 lb

## 2018-09-15 DIAGNOSIS — B9789 Other viral agents as the cause of diseases classified elsewhere: Secondary | ICD-10-CM

## 2018-09-15 DIAGNOSIS — R05 Cough: Secondary | ICD-10-CM | POA: Diagnosis not present

## 2018-09-15 DIAGNOSIS — J988 Other specified respiratory disorders: Secondary | ICD-10-CM

## 2018-09-15 DIAGNOSIS — R059 Cough, unspecified: Secondary | ICD-10-CM

## 2018-09-15 NOTE — Progress Notes (Signed)
Felicia Gardner 58 y.o.   Chief Complaint  Patient presents with  . Cough    nonproductive  . Generalized Body Aches    ALL Sxs started 3-4 days ago    HISTORY OF PRESENT ILLNESS: This is a 58 y.o. female complaining of nonproductive cough with generalized body aches for the past 3 to 4 days.  Denies fever or chills.  Husband sick with the same symptoms.  Seen by me on 09/09/2018.  Denies difficulty breathing or chest pain.  No other significant symptoms.  HPI   Prior to Admission medications   Medication Sig Start Date End Date Taking? Authorizing Provider  Alpha Lipoic Acid 200 MG CAPS Take by mouth daily.    Yes [provider]  blood glucose meter kit and supplies KIT Dispense based on patient and insurance preference. Use up to four times daily as directed. E11.9 09/04/15  Yes Shawnee Knapp, MD  Cholecalciferol (VITAMIN D-3 PO) Take by mouth daily.    Yes [provider]  Chromium Picolinate 1000 MCG TABS Take by mouth.   Yes [provider]  Cinnamon 500 MG capsule Take 500 mg by mouth daily.   Yes [provider]  insulin lispro (HUMALOG) 100 UNIT/ML injection USE UP TO 50 UNITS DAILY PER INSULIN PUMP. 07/19/18  Yes Elayne Snare, MD  Misc Natural Products (LUTEIN 20 PO) Take by mouth.   Yes [provider]  Multiple Vitamin (MULTIVITAMIN WITH MINERALS) TABS tablet Take 1 tablet by mouth daily.   Yes [provider]  rosuvastatin (CRESTOR) 5 MG tablet Take 1 tablet (5 mg total) by mouth daily. 05/28/18  Yes Elayne Snare, MD  vitamin B-12 (CYANOCOBALAMIN) 1000 MCG tablet Take 1,000 mcg by mouth daily.   Yes [provider]  Insulin Pen Needle 32G X 5 MM MISC Use 4 per day to inject Insulin 11/07/15   Elayne Snare, MD  [facility] LANCETS FINE MISC  09/05/15   [provider]    No Known Allergies  Patient Active Problem List   Diagnosis Date Noted  . Hypercholesterolemia 08/19/2017  . Tobacco use disorder  10/04/2015  . DM type 1 (diabetes mellitus, type 1) ([place]) 09/09/2015    Past Medical History:  Diagnosis Date  . Diabetes mellitus without complication ([place]) 0/2111   diagnosed    Past Surgical History:  Procedure Laterality Date  . BUNIONECTOMY  2011   right foot  . TUBAL LIGATION  1990    Social History   Socioeconomic History  . Marital status: Married    Spouse name: Not on file  . Number of children: Not on file  . Years of education: Not on file  . Highest education level: Not on file  Occupational History  . Occupation: Editor, commissioning  Social Needs  . Financial resource strain: Not on file  . Food insecurity:    Worry: Not on file    Inability: Not on file  . Transportation needs:    Medical: Not on file    Non-medical: Not on file  Tobacco Use  . Smoking status: Current Every Day Smoker    Packs/day: 1.00    Years: 41.00    Pack years: 41.00    Types: Cigarettes  . Smokeless tobacco: Never Used  Substance and Sexual Activity  . Alcohol use: No    Alcohol/week: 0.0 standard drinks  . Drug use: No  . Sexual activity: Not on file  Lifestyle  . Physical activity:  Days per week: Not on file    Minutes per session: Not on file  . Stress: Not on file  Relationships  . Social connections:    Talks on phone: Not on file    Gets together: Not on file    Attends religious service: Not on file    Active member of club or organization: Not on file    Attends meetings of clubs or organizations: Not on file    Relationship status: Not on file  . Intimate partner violence:    Fear of current or ex partner: Not on file    Emotionally abused: Not on file    Physically abused: Not on file    Forced sexual activity: Not on file  Other Topics Concern  . Not on file  Social History Narrative  . Not on file    Family History  Problem Relation Age of Onset  . Heart disease Mother   . Graves' disease Mother   . Thyroid disease Sister   . Thyroid disease  Maternal Grandmother   . Diabetes Neg Hx   . Hypertension Father   . Crohn's disease Father   . Rheum arthritis Father   . Thyroid disease Sister      Review of Systems  Constitutional: Negative.  Negative for chills and fever.  HENT: Positive for congestion. Negative for sore throat.   Eyes: Negative.   Respiratory: Positive for cough. Negative for sputum production, shortness of breath and wheezing.   Cardiovascular: Negative.  Negative for chest pain and palpitations.  Gastrointestinal: Negative.  Negative for abdominal pain, diarrhea, nausea and vomiting.  Skin: Negative.  Negative for rash.  Neurological: Negative.  Negative for dizziness and headaches.  Endo/Heme/Allergies: Negative.   All other systems reviewed and are negative.  Vitals:   09/15/18 1400  BP: 136/70  Pulse: 93  Resp: 16  Temp: 98.4 F (36.9 C)  SpO2: 94%     Physical Exam Vitals signs reviewed.  Constitutional:      Appearance: Normal appearance.  HENT:     Head: Normocephalic and atraumatic.     Nose: Nose normal.     Mouth/Throat:     Mouth: Mucous membranes are moist.     Pharynx: Oropharynx is clear.  Eyes:     Extraocular Movements: Extraocular movements intact.     Conjunctiva/sclera: Conjunctivae normal.     Pupils: Pupils are equal, round, and reactive to light.  Neck:     Musculoskeletal: Normal range of motion and neck supple.  Cardiovascular:     Rate and Rhythm: Normal rate and regular rhythm.     Heart sounds: Normal heart sounds.  Pulmonary:     Effort: Pulmonary effort is normal.     Breath sounds: Normal breath sounds.  Musculoskeletal: Normal range of motion.  Skin:    General: Skin is warm and dry.     Capillary Refill: Capillary refill takes less than 2 seconds.  Neurological:     General: No focal deficit present.     Mental Status: She is alert and oriented to person, place, and time.  Psychiatric:        Mood and Affect: Mood normal.        Behavior: Behavior  normal.    A total of 25 minutes was spent in the room with the patient, greater than 50% of which was in counseling/coordination of care regarding differential diagnosis, treatment, medications, need to stay home for 2 weeks and follow-up if no  better.   ASSESSMENT & PLAN: Avion was seen today for cough and generalized body aches.  Diagnoses and all orders for this visit:  Viral respiratory infection  Cough    Patient Instructions       If you have lab work done today you will be contacted with your lab results within the next 2 weeks.  If you have not heard from [place] then please contact us. The fastest way to get your results is to register for My Chart.   IF you received an x-ray today, you will receive an invoice from [facility]. Please contact [facility] at [phone] with questions or concerns regarding your invoice.   IF you received labwork today, you will receive an invoice from [place]. Please contact [facility] at [phone] with questions or concerns regarding your invoice.   Our billing staff will not be able to assist you with questions regarding bills from these companies.  You will be contacted with the lab results as soon as they are available. The fastest way to get your results is to activate your My Chart account. Instructions are located on the last page of this paperwork. If you have not heard from [place] regarding the results in 2 weeks, please contact this office.     Viral Respiratory Infection A viral respiratory infection is an illness that affects parts of the body that are used for breathing. These include the lungs, nose, and throat. It is caused by a germ called a virus. Some examples of this kind of infection are:  A cold.  The flu (influenza).  A respiratory syncytial virus (RSV) infection. A person who gets this illness may have the following symptoms:  A stuffy or runny nose.  Yellow or green fluid in the nose.  A  cough.  Sneezing.  Tiredness (fatigue).  Achy muscles.  A sore throat.  Sweating or chills.  A fever.  A headache. Follow these instructions at home: Managing pain and congestion  Take over-the-counter and prescription medicines only as told by your doctor.  If you have a sore throat, gargle with salt water. Do this 3-4 times per day or as needed. To make a salt-water mixture, dissolve -1 tsp of salt in 1 cup of warm water. Make sure that all the salt dissolves.  Use nose drops made from salt water. This helps with stuffiness (congestion). It also helps soften the skin around your nose.  Drink enough fluid to keep your pee (urine) pale yellow. General instructions   Rest as much as possible.  Do not drink alcohol.  Do not use any products that have nicotine or tobacco, such as cigarettes and e-cigarettes. If you need help quitting, ask your doctor.  Keep all follow-up visits as told by your doctor. This is important. How is this prevented?   Get a flu shot every year. Ask your doctor when you should get your flu shot.  Do not let other people get your germs. If you are sick: ? Stay home from work or school. ? Wash your hands with soap and water often. Wash your hands after you cough or sneeze. If soap and water are not available, use hand sanitizer.  Avoid contact with people who are sick during cold and flu season. This is in fall and winter. Get help if:  Your symptoms last for 10 days or longer.  Your symptoms get worse over time.  You have a fever.  You have very bad pain in your face or  forehead.  Parts of your jaw or neck become very swollen. Get help right away if:  You feel pain or pressure in your chest.  You have shortness of breath.  You faint or feel like you will faint.  You keep throwing up (vomiting).  You feel confused. Summary  A viral respiratory infection is an illness that affects parts of the body that are used for breathing.   Examples of this illness include a cold, the flu, and respiratory syncytial virus (RSV) infection.  The infection can cause a runny nose, cough, sneezing, sore throat, and fever.  Follow what your doctor tells you about taking medicines, drinking lots of fluid, washing your hands, resting at home, and avoiding people who are sick. This information is not intended to replace advice given to you by your health care provider. Make sure you discuss any questions you have with your health care provider. Document Released: 05/22/2008 Document Revised: 07/20/2017 Document Reviewed: 07/20/2017 Elsevier Interactive Patient Education  2019 Elsevier Inc.      Agustina Caroli, MD [facility]

## 2018-09-15 NOTE — Patient Instructions (Addendum)
If you have lab work done today you will be contacted with your lab results within the next 2 weeks.  If you have not heard from [place] then please contact us. The fastest way to get your results is to register for My Chart.   IF you received an x-ray today, you will receive an invoice from [facility]. Please contact [facility] at [phone] with questions or concerns regarding your invoice.   IF you received labwork today, you will receive an invoice from [place]. Please contact [facility] at [phone] with questions or concerns regarding your invoice.   Our billing staff will not be able to assist you with questions regarding bills from these companies.  You will be contacted with the lab results as soon as they are available. The fastest way to get your results is to activate your My Chart account. Instructions are located on the last page of this paperwork. If you have not heard from [place] regarding the results in 2 weeks, please contact this office.     Viral Respiratory Infection A viral respiratory infection is an illness that affects parts of the body that are used for breathing. These include the lungs, nose, and throat. It is caused by a germ called a virus. Some examples of this kind of infection are:  A cold.  The flu (influenza).  A respiratory syncytial virus (RSV) infection. A person who gets this illness may have the following symptoms:  A stuffy or runny nose.  Yellow or green fluid in the nose.  A cough.  Sneezing.  Tiredness (fatigue).  Achy muscles.  A sore throat.  Sweating or chills.  A fever.  A headache. Follow these instructions at home: Managing pain and congestion  Take over-the-counter and prescription medicines only as told by your doctor.  If you have a sore throat, gargle with salt water. Do this 3-4 times per day or as needed. To make a salt-water mixture, dissolve -1 tsp of salt in 1 cup of warm water. Make  sure that all the salt dissolves.  Use nose drops made from salt water. This helps with stuffiness (congestion). It also helps soften the skin around your nose.  Drink enough fluid to keep your pee (urine) pale yellow. General instructions   Rest as much as possible.  Do not drink alcohol.  Do not use any products that have nicotine or tobacco, such as cigarettes and e-cigarettes. If you need help quitting, ask your doctor.  Keep all follow-up visits as told by your doctor. This is important. How is this prevented?   Get a flu shot every year. Ask your doctor when you should get your flu shot.  Do not let other people get your germs. If you are sick: ? Stay home from work or school. ? Wash your hands with soap and water often. Wash your hands after you cough or sneeze. If soap and water are not available, use hand sanitizer.  Avoid contact with people who are sick during cold and flu season. This is in fall and winter. Get help if:  Your symptoms last for 10 days or longer.  Your symptoms get worse over time.  You have a fever.  You have very bad pain in your face or forehead.  Parts of your jaw or neck become very swollen. Get help right away if:  You feel pain or pressure in your chest.  You have shortness of breath.  You faint or feel like you  will faint.  You keep throwing up (vomiting).  You feel confused. Summary  A viral respiratory infection is an illness that affects parts of the body that are used for breathing.  Examples of this illness include a cold, the flu, and respiratory syncytial virus (RSV) infection.  The infection can cause a runny nose, cough, sneezing, sore throat, and fever.  Follow what your doctor tells you about taking medicines, drinking lots of fluid, washing your hands, resting at home, and avoiding people who are sick. This information is not intended to replace advice given to you by your health care provider. Make sure you  discuss any questions you have with your health care provider. Document Released: 05/22/2008 Document Revised: 07/20/2017 Document Reviewed: 07/20/2017 Elsevier Interactive Patient Education  2019 [employer].

## 2018-09-18 ENCOUNTER — Other Ambulatory Visit: Payer: Self-pay | Admitting: Endocrinology

## 2018-09-21 DIAGNOSIS — E109 Type 1 diabetes mellitus without complications: Secondary | ICD-10-CM | POA: Diagnosis not present

## 2018-09-21 DIAGNOSIS — Z794 Long term (current) use of insulin: Secondary | ICD-10-CM | POA: Diagnosis not present

## 2018-10-04 DIAGNOSIS — E1065 Type 1 diabetes mellitus with hyperglycemia: Secondary | ICD-10-CM | POA: Diagnosis not present

## 2018-10-04 DIAGNOSIS — E109 Type 1 diabetes mellitus without complications: Secondary | ICD-10-CM | POA: Diagnosis not present

## 2018-10-04 DIAGNOSIS — Z794 Long term (current) use of insulin: Secondary | ICD-10-CM | POA: Diagnosis not present

## 2018-11-03 DIAGNOSIS — E1065 Type 1 diabetes mellitus with hyperglycemia: Secondary | ICD-10-CM | POA: Diagnosis not present

## 2018-11-03 DIAGNOSIS — E109 Type 1 diabetes mellitus without complications: Secondary | ICD-10-CM | POA: Diagnosis not present

## 2018-11-03 DIAGNOSIS — Z794 Long term (current) use of insulin: Secondary | ICD-10-CM | POA: Diagnosis not present

## 2018-11-24 ENCOUNTER — Telehealth: Payer: Self-pay | Admitting: Endocrinology

## 2018-11-24 NOTE — Telephone Encounter (Signed)
Patient is getting a new pump from [employer] tomorrow and is needing the numbers to program the pump from Dr.Kumar.  Please Advise, Thanks

## 2018-11-24 NOTE — Telephone Encounter (Signed)
Yes she will use the same basal rate settings Her bolus settings also same, confirm that she is using a carbohydrate ratio of 1: 5 for dinnertime until 8 PM

## 2018-11-24 NOTE — Telephone Encounter (Signed)
Are the basal rates from the most recent chart not good to give the pt?

## 2018-11-25 ENCOUNTER — Telehealth: Payer: Self-pay

## 2018-11-25 NOTE — Telephone Encounter (Signed)
This has been addressed in separate note.

## 2018-11-25 NOTE — Telephone Encounter (Signed)
Patient called today requesting basal rates I used the note in patients chart as follows: Basal rate midnight = 0.40, 6 AM = 0.45, 8 AM = 0.35, 6 AM = 0.4 : Bolus settings carbohydrate coverage 1: 7 at breakfast and 1:6  at supper and 1: 8 at lunch Correction 1:50 and target 120, active insulin time 3.5 hours Her carbohydrate ratio will be changed at suppertime to 1: 5 but she will use 1: 7 for snack after 8 PM  Patient stated that this was all she needs and will call back if she has any further questions

## 2018-12-06 ENCOUNTER — Other Ambulatory Visit: Payer: Self-pay

## 2018-12-06 ENCOUNTER — Other Ambulatory Visit (INDEPENDENT_AMBULATORY_CARE_PROVIDER_SITE_OTHER): Payer: BC Managed Care – PPO

## 2018-12-06 DIAGNOSIS — E1065 Type 1 diabetes mellitus with hyperglycemia: Secondary | ICD-10-CM

## 2018-12-06 DIAGNOSIS — E109 Type 1 diabetes mellitus without complications: Secondary | ICD-10-CM | POA: Diagnosis not present

## 2018-12-06 DIAGNOSIS — Z794 Long term (current) use of insulin: Secondary | ICD-10-CM | POA: Diagnosis not present

## 2018-12-06 LAB — BASIC METABOLIC PANEL
BUN: 11 mg/dL (ref 6–23)
CO2: 30 mEq/L (ref 19–32)
Calcium: 9.7 mg/dL (ref 8.4–10.5)
Chloride: 103 mEq/L (ref 96–112)
Creatinine, Ser: 0.75 mg/dL (ref 0.40–1.20)
GFR: 78.96 mL/min (ref >60.00)
Glucose, Bld: 169 mg/dL — ABNORMAL HIGH (ref 70–99)
Potassium: 4.4 mEq/L (ref 3.5–5.1)
Sodium: 139 mEq/L (ref 135–145)

## 2018-12-06 LAB — MICROALBUMIN / CREATININE URINE RATIO
Creatinine,U: 33.7 mg/dL
Microalb Creat Ratio: 2.1 mg/g (ref 0.0–30.0)
Microalb, Ur: <0.7 mg/dL (ref 0.0–1.9)

## 2018-12-06 LAB — HEMOGLOBIN A1C: Hgb A1c MFr Bld: 8.5 % — ABNORMAL HIGH (ref 4.6–6.5)

## 2018-12-08 ENCOUNTER — Telehealth: Payer: Self-pay | Admitting: Endocrinology

## 2018-12-08 NOTE — Telephone Encounter (Signed)
Patient called to inform that she is uploaded her pump for the virtual appointment tomorrow.

## 2018-12-08 NOTE — Telephone Encounter (Signed)
Noted  

## 2018-12-09 ENCOUNTER — Ambulatory Visit (INDEPENDENT_AMBULATORY_CARE_PROVIDER_SITE_OTHER): Payer: BC Managed Care – PPO | Admitting: Endocrinology

## 2018-12-09 ENCOUNTER — Other Ambulatory Visit: Payer: Self-pay

## 2018-12-09 ENCOUNTER — Encounter: Payer: Self-pay | Admitting: Endocrinology

## 2018-12-09 DIAGNOSIS — E1065 Type 1 diabetes mellitus with hyperglycemia: Secondary | ICD-10-CM | POA: Diagnosis not present

## 2018-12-09 DIAGNOSIS — E038 Other specified hypothyroidism: Secondary | ICD-10-CM

## 2018-12-09 DIAGNOSIS — E039 Hypothyroidism, unspecified: Secondary | ICD-10-CM | POA: Diagnosis not present

## 2018-12-09 NOTE — Progress Notes (Addendum)
Patient ID: Felicia Gardner, female   DOB: 1960-07-08, 58 y.o.   MRN: [id number]           Reason for Appointment : Follow-up for Type 1 Diabetes  Today's office visit was provided via telemedicine using video technique The patient was explained the limitations of evaluation and management by telemedicine and the availability of in person appointments.  The patient understood the limitations and agreed to proceed. Patient also understood that the telehealth visit is billable. . Location of the patient: Patient's home . Location of the provider: Physician office Only the patient and myself were participating in the encounter    History of Present Illness          Diagnosis: Type 1 diabetes mellitus, date of diagnosis: 09/03/15        Diabetes  history:    She presented to the emergency room with weight loss, increased thirst and urination and had marked hyperglycemia  Labs in the emergency room showed >80 ketones in the urine and CO2 down to 15 On her initial consultation she was started on mealtime insulin with NovoLog and Lantus dose was increased to 12 units  RECENT history:    INSULIN regimen is: Medtronic 670 pump since 12/09/16  Basal rate midnight = 0.40, 6 AM = 0.45, 8 AM = 0.35, 6 AM = 0.4 : Bolus settings carbohydrate coverage 1: 7 at breakfast and 1:6  at supper and 1: 8 at lunch  Correction 1:50 and target 120, active insulin time 3.5 hours  Her A1C is higher at 8.5 compared to 7.7  She has had the auto mode 98 % of the time with the average insulin dose 40 units, 42 % is bolus amount   She has usually fairly good blood sugars with some tendency to periodic mealtime hyperglycemia as discussed below  Although she tries to bolus before eating blood sugar may sometimes rise fairly rapidly  After her evening snack at 9 PM the last 2 days blood sugars have gone up and with correction doses tend to be low normal during the night  Also her basal rate does not appear to  be acting fast enough for rising or falling blood sugars  Hypoglycemia has been relatively minimal but she feels blood sugar dropping before lunch when she is more active; has not tried the temporary target as discussed   CONTINUOUS GLUCOSE MONITORING RECORD INTERPRETATION    Dates of Recording: 6/4 through 6/17  Sensor description: Guardian  Results statistics:   CGM use % of time  90, auto mode 78%  Average and SD  169+/-55  Time in range     64   %, was 68  % Time Above 180  26  % Time above 250  10  % Time Below target 0    Glycemic patterns summary: Sensor was not applied on 6/4.  Otherwise appears to have accurate readings as judged by sensor data and fingerstick calibration Blood sugars are relatively better early morning but more persistently higher after about 2 PM through 2 AM Hypoglycemia has been minimal  Hyperglycemic episodes are occurring primarily postprandially after lunch or dinner and also when she is not in the auto mode.  Also tends to have hyperglycemia following low normal blood sugars that are limiting her baseline activity temporarily  Hypoglycemic episodes were minimal except on one occasion after 9 PM  Overnight periods: Blood sugars at midnight are averaging about 200 and gradually decreasing until 5 AM with moderate variability  but no hypoglycemia  Preprandial periods: Before breakfast blood sugars are generally fairly close to normal but she usually has only coffee at breakfast and no meal containing carbohydrate Before lunch blood sugars are variable but averaging about 168 Before dinnertime her blood sugars are relatively better and less inconsistent  Postprandial periods:    After lunch: She has fairly frequent hyperglycemia after lunch which may be related to the timing of the bolus but also otherwise getting excessive rise After dinner: Blood sugars are variable with some tendency to low normal or low readings and occasional high readings also    PRE-MEAL Fasting Lunch Dinner Bedtime Overall  Glucose range:       Mean/median:  160  168  154     POST-MEAL PC Breakfast PC Lunch PC Dinner  Glucose range:     Mean/median:  161  245  167     Self-care: The diet that the patient has been following is: generally low fat and moderate in carbohydrates   She is eating Breakfast at 8 am Mostly Peanut butter crackers with coffee; dinner at 6-6:30 pm           Exercise: No formal exercise, she is generally more active at work around midday before lunch            Generally active on weekends around the house  CDE consultation: 3/17 and 7/18  Wt Readings from Last 3 Encounters:  09/15/18 140 lb 3.2 oz (63.6 kg)  09/02/18 143 lb (64.9 kg)  05/28/18 146 lb 12.8 oz (66.6 kg)   Diabetes labs:  Lab Results  Component Value Date   HGBA1C 8.5 (H) 12/06/2018   HGBA1C 7.7 (H) 08/30/2018   HGBA1C 7.8 (H) 05/24/2018   Lab Results  Component Value Date   MICROALBUR <0.7 12/06/2018   LDLCALC 77 08/30/2018   CREATININE 0.75 12/06/2018    Lab Results  Component Value Date   MICRALBCREAT 2.1 12/06/2018   MICRALBCREAT 0.6 11/13/2017     Allergies as of 12/09/2018   No Known Allergies     Medication List       Accurate as of December 09, 2018  2:42 PM. If you have any questions, ask your nurse or doctor.        Alpha Lipoic Acid 200 MG Caps Take by mouth daily.   blood glucose meter kit and supplies Kit Dispense based on patient and insurance preference. Use up to four times daily as directed. E11.9   Chromium Picolinate 1000 MCG Tabs Take by mouth.   Cinnamon 500 MG capsule Take 500 mg by mouth daily.   insulin lispro 100 UNIT/ML injection Commonly known as: HumaLOG USE UP TO 50 UNITS DAILY PER INSULIN PUMP.   Insulin Pen Needle 32G X 5 MM Misc Use 4 per day to inject Insulin   LUTEIN 20 PO Take by mouth.   multivitamin with minerals Tabs tablet Take 1 tablet by mouth daily.   OneTouch Delica Lancets Fine  Misc   rosuvastatin 5 MG tablet Commonly known as: CRESTOR TAKE 1 TABLET BY MOUTH EVERY DAY   vitamin B-12 1000 MCG tablet Commonly known as: CYANOCOBALAMIN Take 1,000 mcg by mouth daily.   VITAMIN D-3 PO Take by mouth daily.       Allergies: No Known Allergies  Past Medical History:  Diagnosis Date  . Diabetes mellitus without complication ([place]) 01/9210   diagnosed    Past Surgical History:  Procedure Laterality Date  . BUNIONECTOMY  2011  right foot  . TUBAL LIGATION  1990    Family History  Problem Relation Age of Onset  . Heart disease Mother   . Graves' disease Mother   . Thyroid disease Sister   . Thyroid disease Maternal Grandmother   . Diabetes Neg Hx   . Hypertension Father   . Crohn's disease Father   . Rheum arthritis Father   . Thyroid disease Sister     Social History:  reports that she has been smoking cigarettes. She has a 41.00 pack-year smoking history. She has never used smokeless tobacco. She reports that she does not drink alcohol or use drugs.    Review of Systems       Lipids: She has been prescribed Crestor because of poor control previously and not taking her pravastatin regularly LDL was last 77  Lab Results  Component Value Date   CHOL 170 08/30/2018   CHOL 254 (H) 05/24/2018   CHOL 214 (H) 08/13/2017   Lab Results  Component Value Date   HDL 72.70 08/30/2018   HDL 74.20 05/24/2018   HDL 74.30 08/13/2017   Lab Results  Component Value Date   LDLCALC 77 08/30/2018   LDLCALC 149 (H) 05/24/2018   LDLCALC 120 (H) 08/13/2017   Lab Results  Component Value Date   TRIG 101.0 08/30/2018   TRIG 156.0 (H) 05/24/2018   TRIG 101.0 08/13/2017   Lab Results  Component Value Date   CHOLHDL 2 08/30/2018   CHOLHDL 3 05/24/2018   CHOLHDL 3 08/13/2017   Lab Results  Component Value Date   LDLDIRECT 134.0 11/13/2017   Lab Results  Component Value Date   ALT 14 08/30/2018         Thyroid:   In the past she had TSH  done because of fatigue and weight loss which occurred for other reasons Had a transient increase in TSH in 3/17, back to normal subsequently TSH has been upper normal and about the same  She has a family history of thyroid disease   Lab Results  Component Value Date   TSH 4.25 05/24/2018   TSH 3.98 08/13/2017   TSH 4.31 11/14/2016   FREET4 0.56 (L) 05/24/2018   FREET4 0.59 (L) 08/13/2017   FREET4 0.58 (L) 03/13/2016   She has a recently normal blood pressure at home  Physical Examination:  There were no vitals taken for this visit.          ASSESSMENT:  Diabetes type 1, date of diagnosis 2017  See history of present illness for detailed discussion of current diabetes management, blood sugar patterns and problems identified  Her blood sugar and daily management was reviewed in detail from her download analysis  Her A1c is higher than expected at 8.5 with her recent CGM average only 169  She has difficulty controlling postprandial readings with significant variability However most of her hyperglycemia is after lunch either related to inadequate boluses or sometimes late boluses Fat intake is generally fairly modest Since increasing her carbohydrate coverage at dinnertime she is now periodically having low normal sugars after a bolus and then rebound hyperglycemia She tends to have high readings most of the time except before breakfast and dinnertime despite using the auto mode Previously was likely doing better when she was more active especially at work  Her abnormal blood sugar patterns and problems identified were discussed Bolus timing and adjustment for various types of meals as well as limitations of the pump were discussed  History of  abnormal TSH: Will need follow-up on the next visit  Blood pressure and urine microalbumin normal  PLAN:   Her carbohydrate ratio will be 1: 7 at lunch instead of 1: 8 and will change the suppertime coverage to 1: 6 instead of 1:  5 Changed at suppertime to 1: 5 but she will use 1: 7 for snack after 8 PM Encouraged her to start regular exercise She will try to make sure she has correction boluses for high readings late at night Also consider a higher amount of correction amount for hyperglycemia  Consider [employer] which is currently not covered by insurance  Follow-up in 3 months  Counseling time on subjects discussed in assessment and plan sections is over 50% of today's 25 minute visit   There are no Patient Instructions on file for this visit.     Elayne Snare 12/09/2018, 2:42 PM   Note: This note was prepared with Dragon voice recognition system technology. Any transcriptional errors that result from this process are unintentional.

## 2018-12-17 ENCOUNTER — Other Ambulatory Visit: Payer: Self-pay | Admitting: Endocrinology

## 2019-01-05 DIAGNOSIS — E109 Type 1 diabetes mellitus without complications: Secondary | ICD-10-CM | POA: Diagnosis not present

## 2019-01-05 DIAGNOSIS — E1065 Type 1 diabetes mellitus with hyperglycemia: Secondary | ICD-10-CM | POA: Diagnosis not present

## 2019-01-05 DIAGNOSIS — Z794 Long term (current) use of insulin: Secondary | ICD-10-CM | POA: Diagnosis not present

## 2019-02-07 DIAGNOSIS — E1065 Type 1 diabetes mellitus with hyperglycemia: Secondary | ICD-10-CM | POA: Diagnosis not present

## 2019-02-07 DIAGNOSIS — Z794 Long term (current) use of insulin: Secondary | ICD-10-CM | POA: Diagnosis not present

## 2019-02-07 DIAGNOSIS — E109 Type 1 diabetes mellitus without complications: Secondary | ICD-10-CM | POA: Diagnosis not present

## 2019-02-25 ENCOUNTER — Other Ambulatory Visit: Payer: Self-pay | Admitting: Endocrinology

## 2019-03-08 ENCOUNTER — Other Ambulatory Visit (INDEPENDENT_AMBULATORY_CARE_PROVIDER_SITE_OTHER): Payer: BC Managed Care – PPO

## 2019-03-08 ENCOUNTER — Other Ambulatory Visit: Payer: Self-pay

## 2019-03-08 DIAGNOSIS — E1065 Type 1 diabetes mellitus with hyperglycemia: Secondary | ICD-10-CM | POA: Diagnosis not present

## 2019-03-08 DIAGNOSIS — E039 Hypothyroidism, unspecified: Secondary | ICD-10-CM

## 2019-03-08 DIAGNOSIS — E038 Other specified hypothyroidism: Secondary | ICD-10-CM

## 2019-03-08 LAB — COMPREHENSIVE METABOLIC PANEL
ALT: 11 U/L (ref 0–35)
AST: 17 U/L (ref 0–37)
Albumin: 4.1 g/dL (ref 3.5–5.2)
Alkaline Phosphatase: 84 U/L (ref 39–117)
BUN: 7 mg/dL (ref 6–23)
CO2: 30 mEq/L (ref 19–32)
Calcium: 9.8 mg/dL (ref 8.4–10.5)
Chloride: 104 mEq/L (ref 96–112)
Creatinine, Ser: 0.72 mg/dL (ref 0.40–1.20)
GFR: 82.7 mL/min (ref >60.00)
Glucose, Bld: 102 mg/dL — ABNORMAL HIGH (ref 70–99)
Potassium: 3.5 mEq/L (ref 3.5–5.1)
Sodium: 141 mEq/L (ref 135–145)
Total Bilirubin: 0.3 mg/dL (ref 0.2–1.2)
Total Protein: 6.5 g/dL (ref 6.0–8.3)

## 2019-03-08 LAB — TSH: TSH: 4.07 u[IU]/mL (ref 0.35–4.50)

## 2019-03-08 LAB — LIPID PANEL
Cholesterol: 186 mg/dL (ref 0–200)
HDL: 76.5 mg/dL (ref >39.00)
LDL Cholesterol: 96 mg/dL (ref 0–99)
NonHDL: 109.94
Total CHOL/HDL Ratio: 2
Triglycerides: 70 mg/dL (ref 0.0–149.0)
VLDL: 14 mg/dL (ref 0.0–40.0)

## 2019-03-08 LAB — HEMOGLOBIN A1C: Hgb A1c MFr Bld: 8.8 % — ABNORMAL HIGH (ref 4.6–6.5)

## 2019-03-10 DIAGNOSIS — E109 Type 1 diabetes mellitus without complications: Secondary | ICD-10-CM | POA: Diagnosis not present

## 2019-03-10 DIAGNOSIS — Z794 Long term (current) use of insulin: Secondary | ICD-10-CM | POA: Diagnosis not present

## 2019-03-10 DIAGNOSIS — E1065 Type 1 diabetes mellitus with hyperglycemia: Secondary | ICD-10-CM | POA: Diagnosis not present

## 2019-03-11 ENCOUNTER — Ambulatory Visit (INDEPENDENT_AMBULATORY_CARE_PROVIDER_SITE_OTHER): Payer: BC Managed Care – PPO | Admitting: Endocrinology

## 2019-03-11 ENCOUNTER — Encounter: Payer: Self-pay | Admitting: Endocrinology

## 2019-03-11 ENCOUNTER — Other Ambulatory Visit: Payer: Self-pay

## 2019-03-11 DIAGNOSIS — E7800 Pure hypercholesterolemia, unspecified: Secondary | ICD-10-CM

## 2019-03-11 DIAGNOSIS — E1065 Type 1 diabetes mellitus with hyperglycemia: Secondary | ICD-10-CM

## 2019-03-11 NOTE — Progress Notes (Signed)
Patient ID: Felicia Gardner, female   DOB: Apr 17, 1961, 58 y.o.   MRN: [id number]           Reason for Appointment : Follow-up for Type 1 Diabetes  Today's office visit was provided via telemedicine using video technique The patient was explained the limitations of evaluation and management by telemedicine and the availability of in person appointments.  The patient understood the limitations and agreed to proceed. Patient also understood that the telehealth visit is billable. . Location of the patient: Patient's home . Location of the provider: Physician office Only the patient and myself were participating in the encounter    History of Present Illness          Diagnosis: Type 1 diabetes mellitus, date of diagnosis: 09/03/15        Diabetes  history:    She presented to the emergency room with weight loss, increased thirst and urination and had marked hyperglycemia  Labs in the emergency room showed >80 ketones in the urine and CO2 down to 15 On her initial consultation she was started on mealtime insulin with NovoLog and Lantus dose was increased to 12 units  RECENT history:    INSULIN regimen is: Medtronic 670 pump since 12/09/16  Basal rate midnight = 0.40, 6 AM = 0.45, 8 AM = 0.35, 6 AM = 0.4 : Bolus settings carbohydrate coverage 1: 7 at breakfast and lunch and 1:6  at supper  Correction 1:50 and target 120, active insulin time 4 hours  Her A1C is higher at 8.8  No baseline fructosamine available  She has had the auto mode 95 % of the time with the average insulin dose 28 units, 44 % is bolus amount   She continues have a high A1c even though recently her blood sugars are averaging is in the 160s  She has inconsistent control after meals  Blood sugar patterns are discussed below  She is back to work but with inconsistent activity level this is not affecting her glucose levels are causing low sugars  With eating at work more often she may be sometimes getting higher  fat meals causing more hypoglycemia and does not always bolus  CONTINUOUS GLUCOSE MONITORING RECORD INTERPRETATION    Dates of Recording: 9/4 through 9/17  Sensor description: Guardian  Results statistics:   CGM use % of time  93  Average and SD  162+/-48  Time in range        68% was 64  % Time Above 180  27  % Time above 250  5  % Time Below target  0    Glycemic patterns summary:  Her blood sugars are overall the highest after about 7 PM which is dinnertime Lowest blood sugars are about 12-1 PM before lunch Hypoglycemia has been rare Compared to her previous visit has less hypoglycemia in the afternoons  Hyperglycemic episodes are occurring primarily postprandially but most often after her evening meal and occasionally late at night Most significant hyperglycemia was on 9/5 possibly related to stent malfunction  Hypoglycemic episodes occurred twice, one that 4 PM and another at 7 PM  Overnight periods: Blood sugars average 180 at midnight and then gradually decrease until 8 AM, less variability after 3 AM and no hypoglycemia  Preprandial periods: She does not always eat breakfast and blood sugars show only a slight dawn-phenomenon in the morning Blood sugars are excellent at lunchtime but higher on an average at dinnertime averaging 192  Postprandial periods:   After  breakfast: Has only 4 boluses which show some overall increase  After lunch:   Blood sugar is overall rising significantly within the first hour and then flat  After dinner: Although overall blood sugars are not rising they are not consistently controlled with significant variability At least once may have been related to missed boluses   PRE-MEAL Fasting Lunch Dinner Bedtime Overall  Glucose range:  104-201    AC/PC   Mean/median: 131  125  192  163/166    POST-MEAL PC Breakfast PC Lunch PC Dinner  Glucose range:     Mean/median:  185  179  212   Self-care: The diet that the patient has been  following is: generally low fat and moderate in carbohydrates   She is eating Breakfast at 8 am Mostly Peanut butter crackers with coffee; dinner at 6-6:30 pm           Exercise: No formal exercise, she is generally more active at work around midday before lunch            Generally active on weekends around the house  CDE consultation: 3/17 and 7/18  Wt Readings from Last 3 Encounters:  09/15/18 140 lb 3.2 oz (63.6 kg)  09/02/18 143 lb (64.9 kg)  05/28/18 146 lb 12.8 oz (66.6 kg)   Diabetes labs:  Lab Results  Component Value Date   HGBA1C 8.8 (H) 03/08/2019   HGBA1C 8.5 (H) 12/06/2018   HGBA1C 7.7 (H) 08/30/2018   Lab Results  Component Value Date   MICROALBUR <0.7 12/06/2018   [place] 96 03/08/2019   CREATININE 0.72 03/08/2019    Lab Results  Component Value Date   MICRALBCREAT 2.1 12/06/2018   MICRALBCREAT 0.6 11/13/2017   No results found for: FRUCTOSAMINE    Allergies as of 03/11/2019   No Known Allergies     Medication List       Accurate as of March 11, 2019 11:59 PM. If you have any questions, ask your nurse or doctor.        Alpha Lipoic Acid 200 MG Caps Take by mouth daily.   blood glucose meter kit and supplies Kit Dispense based on patient and insurance preference. Use up to four times daily as directed. E11.9   Chromium Picolinate 1000 MCG Tabs Take by mouth.   Cinnamon 500 MG capsule Take 500 mg by mouth daily.   insulin lispro 100 UNIT/ML injection Commonly known as: HumaLOG USE UP TO 50 UNITS DAILY PER INSULIN PUMP.   Insulin Pen Needle 32G X 5 MM Misc Use 4 per day to inject Insulin   LUTEIN 20 PO Take by mouth.   multivitamin with minerals Tabs tablet Take 1 tablet by mouth daily.   OneTouch Delica Lancets Fine Misc   rosuvastatin 5 MG tablet Commonly known as: CRESTOR TAKE 1 TABLET BY MOUTH EVERY DAY   vitamin B-12 1000 MCG tablet Commonly known as: CYANOCOBALAMIN Take 1,000 mcg by mouth daily.   VITAMIN D-3 PO  Take by mouth daily.       Allergies: No Known Allergies  Past Medical History:  Diagnosis Date  . Diabetes mellitus without complication ([place]) 01/1156   diagnosed    Past Surgical History:  Procedure Laterality Date  . BUNIONECTOMY  2011   right foot  . TUBAL LIGATION  1990    Family History  Problem Relation Age of Onset  . Heart disease Mother   . Graves' disease Mother   . Thyroid disease Sister   .  Thyroid disease Maternal Grandmother   . Diabetes Neg Hx   . Hypertension Father   . Crohn's disease Father   . Rheum arthritis Father   . Thyroid disease Sister     Social History:  reports that she has been smoking cigarettes. She has a 41.00 pack-year smoking history. She has never used smokeless tobacco. She reports that she does not drink alcohol or use drugs.    Review of Systems       Lipids: She has been prescribed Crestor because of poor control previously and not taking her pravastatin regularly LDL was last 77 and is now 96, not clear if this is related to less of a strict diet especially with eating out or not planning meals well when she was not working   Lab Results  Component Value Date   CHOL 186 03/08/2019   CHOL 170 08/30/2018   CHOL 254 (H) 05/24/2018   Lab Results  Component Value Date   HDL 76.50 03/08/2019   HDL 72.70 08/30/2018   HDL 74.20 05/24/2018   Lab Results  Component Value Date   LDLCALC 96 03/08/2019   LDLCALC 77 08/30/2018   LDLCALC 149 (H) 05/24/2018   Lab Results  Component Value Date   TRIG 70.0 03/08/2019   TRIG 101.0 08/30/2018   TRIG 156.0 (H) 05/24/2018   Lab Results  Component Value Date   CHOLHDL 2 03/08/2019   CHOLHDL 2 08/30/2018   CHOLHDL 3 05/24/2018   Lab Results  Component Value Date   LDLDIRECT 134.0 11/13/2017   Lab Results  Component Value Date   ALT 11 03/08/2019         Thyroid:   In the past she had TSH done because of fatigue and weight loss which occurred for other reasons Had  a transient increase in TSH in 3/17, back to normal subsequently TSH has been upper normal   She has a family history of thyroid disease   Lab Results  Component Value Date   TSH 4.07 03/08/2019   TSH 4.25 05/24/2018   TSH 3.98 08/13/2017   FREET4 0.56 (L) 05/24/2018   FREET4 0.59 (L) 08/13/2017   FREET4 0.58 (L) 03/13/2016     Physical Examination:  There were no vitals taken for this visit.          ASSESSMENT:  Diabetes type 1, date of diagnosis 2017  See history of present illness for detailed discussion of current diabetes management, blood sugar patterns and problems identified  Her blood sugar and daily management was reviewed in detail from her download analysis  Her A1c is higher than expected at 8.8 with her recent CGM average only 162  She has inconsistent control of postprandial readings as discussed above Factors related to hyperglycemia including inadequate coverage for higher fat meals, timing of bolus, occasional missed boluses and carbohydrate ratio were discussed Also does have unpredictable response to similar meals from time to time  Also her active insulin time previously was 4 hours and is now only 3.5  High normal TSH: No change   PLAN:   Her carbohydrate ratio will be 1: 6 at lunch instead of 7 She will need to bolus up to 50% more carbohydrates for high-fat meals such as pizza Active insulin 3-1/2 hours Temporary target when she is doing more vigorous exercise Try to bolus at a time consistently before eating Must enter blood sugars at the time of each bolus to enable correction Check fructosamine also on the next visit  and A1c from [facility] to crosscheck her A1c results Continue to cover a bolus for coffee in the morning Low-fat meals  Follow-up in 3 months  Counseling time on subjects discussed in assessment and plan sections is over 50% of today's 25 minute visit   There are no Patient Instructions on file for this visit.      Elayne Snare 03/13/2019, 8:22 PM   Note: This note was prepared with Dragon voice recognition system technology. Any transcriptional errors that result from this process are unintentional.

## 2019-04-11 DIAGNOSIS — Z794 Long term (current) use of insulin: Secondary | ICD-10-CM | POA: Diagnosis not present

## 2019-04-11 DIAGNOSIS — E1065 Type 1 diabetes mellitus with hyperglycemia: Secondary | ICD-10-CM | POA: Diagnosis not present

## 2019-04-11 DIAGNOSIS — E109 Type 1 diabetes mellitus without complications: Secondary | ICD-10-CM | POA: Diagnosis not present

## 2019-05-12 DIAGNOSIS — E109 Type 1 diabetes mellitus without complications: Secondary | ICD-10-CM | POA: Diagnosis not present

## 2019-05-12 DIAGNOSIS — Z794 Long term (current) use of insulin: Secondary | ICD-10-CM | POA: Diagnosis not present

## 2019-05-12 DIAGNOSIS — E1065 Type 1 diabetes mellitus with hyperglycemia: Secondary | ICD-10-CM | POA: Diagnosis not present

## 2019-06-10 ENCOUNTER — Other Ambulatory Visit: Payer: Self-pay | Admitting: Endocrinology

## 2019-06-13 DIAGNOSIS — E109 Type 1 diabetes mellitus without complications: Secondary | ICD-10-CM | POA: Diagnosis not present

## 2019-06-13 DIAGNOSIS — Z794 Long term (current) use of insulin: Secondary | ICD-10-CM | POA: Diagnosis not present

## 2019-06-13 DIAGNOSIS — E1065 Type 1 diabetes mellitus with hyperglycemia: Secondary | ICD-10-CM | POA: Diagnosis not present

## 2019-07-13 ENCOUNTER — Other Ambulatory Visit: Payer: BC Managed Care – PPO

## 2019-07-14 ENCOUNTER — Other Ambulatory Visit: Payer: Self-pay

## 2019-07-14 ENCOUNTER — Other Ambulatory Visit (INDEPENDENT_AMBULATORY_CARE_PROVIDER_SITE_OTHER): Payer: BC Managed Care – PPO

## 2019-07-14 DIAGNOSIS — E1065 Type 1 diabetes mellitus with hyperglycemia: Secondary | ICD-10-CM | POA: Diagnosis not present

## 2019-07-14 DIAGNOSIS — Z794 Long term (current) use of insulin: Secondary | ICD-10-CM | POA: Diagnosis not present

## 2019-07-14 DIAGNOSIS — E109 Type 1 diabetes mellitus without complications: Secondary | ICD-10-CM | POA: Diagnosis not present

## 2019-07-14 LAB — BASIC METABOLIC PANEL
BUN: 9 mg/dL (ref 6–23)
CO2: 31 mEq/L (ref 19–32)
Calcium: 9.4 mg/dL (ref 8.4–10.5)
Chloride: 102 mEq/L (ref 96–112)
Creatinine, Ser: 0.77 mg/dL (ref 0.40–1.20)
GFR: 76.44 mL/min (ref >60.00)
Glucose, Bld: 269 mg/dL — ABNORMAL HIGH (ref 70–99)
Potassium: 4.2 mEq/L (ref 3.5–5.1)
Sodium: 138 mEq/L (ref 135–145)

## 2019-07-15 LAB — HEMOGLOBIN A1C
Est. average glucose Bld gHb Est-mCnc: 192 mg/dL
Hgb A1c MFr Bld: 8.3 % — ABNORMAL HIGH (ref 4.8–5.6)

## 2019-07-15 LAB — FRUCTOSAMINE: Fructosamine: 334 umol/L — ABNORMAL HIGH (ref 0–285)

## 2019-07-18 ENCOUNTER — Encounter: Payer: Self-pay | Admitting: Endocrinology

## 2019-07-18 ENCOUNTER — Ambulatory Visit (INDEPENDENT_AMBULATORY_CARE_PROVIDER_SITE_OTHER): Payer: BC Managed Care – PPO | Admitting: Endocrinology

## 2019-07-18 DIAGNOSIS — E039 Hypothyroidism, unspecified: Secondary | ICD-10-CM | POA: Diagnosis not present

## 2019-07-18 DIAGNOSIS — E1065 Type 1 diabetes mellitus with hyperglycemia: Secondary | ICD-10-CM | POA: Diagnosis not present

## 2019-07-18 DIAGNOSIS — E7800 Pure hypercholesterolemia, unspecified: Secondary | ICD-10-CM

## 2019-07-18 DIAGNOSIS — E038 Other specified hypothyroidism: Secondary | ICD-10-CM

## 2019-07-18 NOTE — Progress Notes (Signed)
Patient ID: Felicia Gardner, female   DOB: [birth year], 59 y.o.   MRN: [id number]           Reason for Appointment : Follow-up for Type 1 Diabetes  Today's office visit was provided via telemedicine using video technique The patient was explained the limitations of evaluation and management by telemedicine and the availability of in person appointments.  The patient understood the limitations and agreed to proceed. Patient also understood that the telehealth visit is billable. . Location of the patient: Patient's home . Location of the provider: Physician office Only the patient and myself were participating in the encounter    History of Present Illness          Diagnosis: Type 1 diabetes mellitus, date of diagnosis: 09/03/15        Diabetes  history:    She presented to the emergency room with weight loss, increased thirst and urination and had marked hyperglycemia  Labs in the emergency room showed >80 ketones in the urine and CO2 down to 15 On her initial consultation she was started on mealtime insulin with NovoLog and Lantus dose was increased to 12 units  RECENT history:    INSULIN regimen is: Medtronic 670 pump since 12/09/16  Basal rate midnight = 0.40, 6 AM = 0.45, 8 AM = 0.35, 6 AM = 0.4 : Bolus settings carbohydrate coverage 1: 7 at breakfast and lunch and 1:6  at supper  Correction 1:50 and target 120, active insulin time 4 hours  Her A1C is better at 8.3, previously was at 8.8  Fructosamine is 330:  She has had the auto mode 95 % of the time with the average insulin dose 30 units, 40 % is bolus amount   She has a relatively lower fructosamine compared to her A1c  On her last visit carbohydrate coverage was changed to 1: 6 to get better readings postprandially  Her blood sugar patterns and discussion about her abnormal glucose patterns is also discussed in the CGM description  With this her postprandial readings are not as high  Although she thinks her blood  sugars are dropping low before lunch and dinner her average blood sugar premeal dose meals is about 160  She only has an occasional drop in her blood sugar related to overcorrection occurring both on the same day  However she does appear to have occasionally low normal readings following correction boluses  Her blood sugars have been more variable in the last week for no apparent reason  Only occasionally will have somewhat low readings at home when she is more active with housework such as yesterday  Not very active at work recently  She is trying to bolus about 5 minutes before eating and fairly consistent with this  Meals are generally not high fat, she is using an air [employer]ter to cook at home   [place]    Dates of Recording: 1/11 through 1/24  Sensor description: Guardian  Results statistics:   CGM use % of time  95  Average and SD  159+/-50, GV 32  Time in range       69%  % Time Above 180  25  % Time above 250  5  % Time Below target  1    PRE-MEAL Fasting Lunch Dinner Bedtime Overall  Glucose range:       Mean/median:  138  166  158   159   POST-MEAL PC Breakfast PC Lunch PC Dinner  Glucose range:     Mean/median:  132  179  160    Glycemic patterns summary:   Hyperglycemic episodes are occurring sporadically after meals, rarely due to missed boluses and occasionally due to inadequate bolus Occasionally blood sugars are rising after low normal blood sugars and the basal acting fast enough  Hypoglycemic episodes occurred 3 times mostly after correction boluses with only minor decrease in blood sugar transiently had 1-2 AM, 12-1 PM and 4 PM respectively   Overnight periods:   Preprandial periods: Her blood sugars are generally within target at the time of her morning bolus At lunchtime blood sugars may be occasionally high Before dinnertime blood sugars are mildly increased but somewhat variable  Postprandial  periods:   After breakfast: Usually not eating any carbs at breakfast  After lunch: The blood sugars are quite variable postprandially although on average they are relatively flat, range between about 70-300 This may depend on her type of meal  After dinner: Her glucose is again relatively flat after the meal with only occasional significant rise over 200 and only occasional low normal readings  Previous data:   CGM use % of time  93  Average and SD  162+/-48  Time in range        68% was 64  % Time Above 180  27  % Time above 250  5  % Time Below target  0     PRE-MEAL Fasting Lunch Dinner Bedtime Overall  Glucose range:  104-201    AC/PC   Mean/median: 131  125  192  163/166    POST-MEAL PC Breakfast PC Lunch PC Dinner  Glucose range:     Mean/median:  185  179  212   Self-care: The diet that the patient has been following is: generally low fat and moderate in carbohydrates   She is eating Breakfast at 8 am usually peanut butter crackers with coffee; dinner at 6-6:30 pm                      Generally active on weekends around the house  CDE consultation: 3/17 and 7/18  Wt Readings from Last 3 Encounters:  09/15/18 140 lb 3.2 oz (63.6 kg)  09/02/18 143 lb (64.9 kg)  05/28/18 146 lb 12.8 oz (66.6 kg)   Diabetes labs:  Lab Results  Component Value Date   HGBA1C 8.3 (H) 07/14/2019   HGBA1C 8.8 (H) 03/08/2019   HGBA1C 8.5 (H) 12/06/2018   Lab Results  Component Value Date   MICROALBUR <0.7 12/06/2018   LDLCALC 96 03/08/2019   CREATININE 0.77 07/14/2019    Lab Results  Component Value Date   MICRALBCREAT 2.1 12/06/2018   MICRALBCREAT 0.6 11/13/2017   Lab Results  Component Value Date   FRUCTOSAMINE 334 (H) 07/14/2019      Allergies as of 07/18/2019   No Known Allergies     Medication List       Accurate as of July 18, 2019  2:11 PM. If you have any questions, ask your nurse or doctor.        Alpha Lipoic Acid 200 MG Caps Take by mouth  daily.   blood glucose meter kit and supplies Kit Dispense based on patient and insurance preference. Use up to four times daily as directed. E11.9   Chromium Picolinate 1000 MCG Tabs Take by mouth.   Cinnamon 500 MG capsule Take 500 mg by mouth daily.   insulin lispro 100 UNIT/ML injection  Commonly known as: HumaLOG USE UP TO 50 UNITS DAILY PER INSULIN PUMP.   Insulin Pen Needle 32G X 5 MM Misc Use 4 per day to inject Insulin   LUTEIN 20 PO Take by mouth.   multivitamin with minerals Tabs tablet Take 1 tablet by mouth daily.   OneTouch Delica Lancets Fine Misc   rosuvastatin 5 MG tablet Commonly known as: CRESTOR TAKE 1 TABLET BY MOUTH EVERY DAY   vitamin B-12 1000 MCG tablet Commonly known as: CYANOCOBALAMIN Take 1,000 mcg by mouth daily.   VITAMIN D-3 PO Take by mouth daily.       Allergies: No Known Allergies  Past Medical History:  Diagnosis Date  . Diabetes mellitus without complication ([place]) 02/4800   diagnosed    Past Surgical History:  Procedure Laterality Date  . BUNIONECTOMY  2011   right foot  . TUBAL LIGATION  1990    Family History  Problem Relation Age of Onset  . Heart disease Mother   . Graves' disease Mother   . Thyroid disease Sister   . Thyroid disease Maternal Grandmother   . Diabetes Neg Hx   . Hypertension Father   . Crohn's disease Father   . Rheum arthritis Father   . Thyroid disease Sister     Social History:  reports that she has been smoking cigarettes. She has a 41.00 pack-year smoking history. She has never used smokeless tobacco. She reports that she does not drink alcohol or use drugs.    Review of Systems   No history of hypertension: She says her blood pressure was 655 systolic at work today      Lipids: She has been prescribed Crestor because of poor control previously and not taking her pravastatin regularly  LDL was previously 77 and is last 96, not clear if this is related to less of a strict diet  especially with eating out or not planning meals well when she was not working   Lab Results  Component Value Date   CHOL 186 03/08/2019   CHOL 170 08/30/2018   CHOL 254 (H) 05/24/2018   Lab Results  Component Value Date   HDL 76.50 03/08/2019   HDL 72.70 08/30/2018   HDL 74.20 05/24/2018   Lab Results  Component Value Date   LDLCALC 96 03/08/2019   LDLCALC 77 08/30/2018   LDLCALC 149 (H) 05/24/2018   Lab Results  Component Value Date   TRIG 70.0 03/08/2019   TRIG 101.0 08/30/2018   TRIG 156.0 (H) 05/24/2018   Lab Results  Component Value Date   CHOLHDL 2 03/08/2019   CHOLHDL 2 08/30/2018   CHOLHDL 3 05/24/2018   Lab Results  Component Value Date   LDLDIRECT 134.0 11/13/2017   Lab Results  Component Value Date   ALT 11 03/08/2019         Thyroid:   In the past she had TSH done because of fatigue and weight loss which occurred for other reasons Had a transient increase in TSH in 3/17, back to normal subsequently TSH has been upper normal consistently  She has a family history of thyroid disease   Lab Results  Component Value Date   TSH 4.07 03/08/2019   TSH 4.25 05/24/2018   TSH 3.98 08/13/2017   FREET4 0.56 (L) 05/24/2018   FREET4 0.59 (L) 08/13/2017   FREET4 0.58 (L) 03/13/2016     Physical Examination:  There were no vitals taken for this visit.  ASSESSMENT:  Diabetes type 1, date of diagnosis 2017  See history of present illness for detailed discussion of current diabetes management, blood sugar patterns and problems identified  Her blood sugar patterns and daily insulin pump management was reviewed in detail from her insulin pump download analysis as above  Her A1c is higher than expected at 8.3 although slightly better  Her last 2-week average blood sugar is 159 on the CGM Fructosamine is 334 indicating an A1c of about 7.5  She has had more variability in the last week or so Some of this may be related to inconsistent  response of her auto basal to rising or falling blood sugars She may benefit from better sensor and transmitter that is available on the new pump Also occasionally may overshoot and had low normal blood sugars with her correction factor I: 50 She is not using the temporary target when she is more active with housecleaning  High normal TSH: To recheck on the next visit  Higher blood pressure: She will have a blood pressure checked at work more consistently and also come into the office for blood pressure check on the next visit   PLAN:   She will look into the 770 pump Sensitivity 1: 55 No change in carbohydrate ratio or active insulin time as yet Make sure she uses a temporary target of 150 when she is planning to be more active either at home or work Consistently bolus before starting to eat  We will check fructosamine on her visits to correlate with her actual level of control  Follow-up in 3 months     There are no Patient Instructions on file for this visit.     Elayne Snare 07/18/2019, 2:11 PM   Note: This note was prepared with Dragon voice recognition system technology. Any transcriptional errors that result from this process are unintentional.

## 2019-08-03 DIAGNOSIS — E109 Type 1 diabetes mellitus without complications: Secondary | ICD-10-CM | POA: Diagnosis not present

## 2019-08-05 ENCOUNTER — Other Ambulatory Visit: Payer: Self-pay

## 2019-08-05 ENCOUNTER — Telehealth: Payer: Self-pay | Admitting: Endocrinology

## 2019-08-05 MED ORDER — ACCU-CHEK SOFT TOUCH LANCETS MISC: 12 refills | Status: DC

## 2019-08-05 MED ORDER — ACCU-CHEK GUIDE VI STRP: ORAL_STRIP | 12 refills | Status: DC

## 2019-08-05 NOTE — Telephone Encounter (Signed)
MEDICATION: Accu Check Guide Link Test Strips and Lancets  PHARMACY:  [facility] on [address]  IS THIS A 90 DAY SUPPLY :   IS PATIENT OUT OF MEDICATION: new Rx due to getting new meter  IF NOT; HOW MUCH IS LEFT:   LAST APPOINTMENT DATE: @1 /25/2021  NEXT APPOINTMENT DATE:@Visit  date not found  DO WE HAVE YOUR PERMISSION TO LEAVE A DETAILED MESSAGE: yes [phone]  OTHER COMMENTS:    **Let patient know to contact pharmacy at the end of the day to make sure medication is ready. **  ** Please notify patient to allow 48-72 hours to process**  **Encourage patient to contact the pharmacy for refills or they can request refills through [facility]**

## 2019-08-08 ENCOUNTER — Other Ambulatory Visit: Payer: Self-pay

## 2019-08-08 DIAGNOSIS — Z794 Long term (current) use of insulin: Secondary | ICD-10-CM | POA: Diagnosis not present

## 2019-08-08 DIAGNOSIS — E109 Type 1 diabetes mellitus without complications: Secondary | ICD-10-CM | POA: Diagnosis not present

## 2019-08-08 DIAGNOSIS — E1065 Type 1 diabetes mellitus with hyperglycemia: Secondary | ICD-10-CM | POA: Diagnosis not present

## 2019-08-08 MED ORDER — ACCU-CHEK GUIDE VI STRP: ORAL_STRIP | 12 refills | Status: AC

## 2019-08-08 MED ORDER — ACCU-CHEK GUIDE ME W/DEVICE KIT: 1.0000 | PACK | Freq: Two times a day (BID) | 0 refills | Status: AC

## 2019-08-08 MED ORDER — ACCU-CHEK SOFT TOUCH LANCETS MISC: 12 refills | Status: AC

## 2019-08-08 NOTE — Telephone Encounter (Signed)
New Rx sent for Accu Chek Guide supplies. Accu chek link is not available for ordering anymore.

## 2019-08-26 ENCOUNTER — Other Ambulatory Visit: Payer: Self-pay | Admitting: Endocrinology

## 2019-09-13 DIAGNOSIS — Z794 Long term (current) use of insulin: Secondary | ICD-10-CM | POA: Diagnosis not present

## 2019-09-13 DIAGNOSIS — E109 Type 1 diabetes mellitus without complications: Secondary | ICD-10-CM | POA: Diagnosis not present

## 2019-09-13 DIAGNOSIS — E1065 Type 1 diabetes mellitus with hyperglycemia: Secondary | ICD-10-CM | POA: Diagnosis not present

## 2019-10-31 ENCOUNTER — Other Ambulatory Visit: Payer: Self-pay | Admitting: Internal Medicine

## 2019-10-31 DIAGNOSIS — Z1231 Encounter for screening mammogram for malignant neoplasm of breast: Secondary | ICD-10-CM

## 2019-11-19 ENCOUNTER — Other Ambulatory Visit: Payer: Self-pay | Admitting: Endocrinology

## 2020-02-10 ENCOUNTER — Other Ambulatory Visit: Payer: Self-pay | Admitting: Endocrinology

## 2020-02-13 NOTE — Telephone Encounter (Signed)
LMTCB to schedule.

## 2020-02-13 NOTE — Telephone Encounter (Signed)
Patient requesting refill for Rosuvastatin 5mg . LOV 07/18/19. Advised to follow up in 3 months. No follow up appointment has been made. Please advise.

## 2020-02-13 NOTE — Telephone Encounter (Signed)
Give 30-day prescription with no refills, need to be scheduled for 30-minute visit with labs

## 2020-03-01 ENCOUNTER — Other Ambulatory Visit: Payer: Self-pay

## 2020-03-01 ENCOUNTER — Other Ambulatory Visit (INDEPENDENT_AMBULATORY_CARE_PROVIDER_SITE_OTHER): Payer: No Typology Code available for payment source

## 2020-03-01 DIAGNOSIS — E1065 Type 1 diabetes mellitus with hyperglycemia: Secondary | ICD-10-CM | POA: Diagnosis not present

## 2020-03-01 DIAGNOSIS — E7800 Pure hypercholesterolemia, unspecified: Secondary | ICD-10-CM

## 2020-03-01 DIAGNOSIS — E038 Other specified hypothyroidism: Secondary | ICD-10-CM

## 2020-03-01 DIAGNOSIS — E039 Hypothyroidism, unspecified: Secondary | ICD-10-CM | POA: Diagnosis not present

## 2020-03-01 LAB — COMPREHENSIVE METABOLIC PANEL
ALT: 19 U/L (ref 0–35)
AST: 24 U/L (ref 0–37)
Albumin: 4.2 g/dL (ref 3.5–5.2)
Alkaline Phosphatase: 93 U/L (ref 39–117)
BUN: 11 mg/dL (ref 6–23)
CO2: 30 mEq/L (ref 19–32)
Calcium: 9.5 mg/dL (ref 8.4–10.5)
Chloride: 102 mEq/L (ref 96–112)
Creatinine, Ser: 0.79 mg/dL (ref 0.40–1.20)
GFR: 74.05 mL/min (ref >60.00)
Glucose, Bld: 209 mg/dL — ABNORMAL HIGH (ref 70–99)
Potassium: 4.2 mEq/L (ref 3.5–5.1)
Sodium: 139 mEq/L (ref 135–145)
Total Bilirubin: 0.5 mg/dL (ref 0.2–1.2)
Total Protein: 6.6 g/dL (ref 6.0–8.3)

## 2020-03-01 LAB — TSH: TSH: 4.19 u[IU]/mL (ref 0.35–4.50)

## 2020-03-01 LAB — LIPID PANEL
Cholesterol: 188 mg/dL (ref 0–200)
HDL: 77.9 mg/dL (ref >39.00)
LDL Cholesterol: 96 mg/dL (ref 0–99)
NonHDL: 110.29
Total CHOL/HDL Ratio: 2
Triglycerides: 69 mg/dL (ref 0.0–149.0)
VLDL: 13.8 mg/dL (ref 0.0–40.0)

## 2020-03-01 LAB — URINALYSIS, ROUTINE W REFLEX MICROSCOPIC
Bilirubin Urine: NEGATIVE
Hgb urine dipstick: NEGATIVE
Ketones, ur: NEGATIVE
Leukocytes,Ua: NEGATIVE
Nitrite: NEGATIVE
RBC / HPF: NONE SEEN (ref 0–?)
Specific Gravity, Urine: 1.02 (ref 1.000–1.030)
Total Protein, Urine: NEGATIVE
Urine Glucose: NEGATIVE
Urobilinogen, UA: 0.2 (ref 0.0–1.0)
WBC, UA: NONE SEEN (ref 0–?)
pH: 5.5 (ref 5.0–8.0)

## 2020-03-01 LAB — MICROALBUMIN / CREATININE URINE RATIO
Creatinine,U: 65.9 mg/dL
Microalb Creat Ratio: 1.1 mg/g (ref 0.0–30.0)
Microalb, Ur: <0.7 mg/dL (ref 0.0–1.9)

## 2020-03-01 LAB — HEMOGLOBIN A1C: Hgb A1c MFr Bld: 9 % — ABNORMAL HIGH (ref 4.6–6.5)

## 2020-03-06 ENCOUNTER — Encounter: Payer: Self-pay | Admitting: Endocrinology

## 2020-03-06 ENCOUNTER — Other Ambulatory Visit: Payer: Self-pay

## 2020-03-06 ENCOUNTER — Ambulatory Visit: Payer: No Typology Code available for payment source | Admitting: Endocrinology

## 2020-03-06 VITALS — BP 106/82 | HR 79 | Wt 135.0 lb

## 2020-03-06 DIAGNOSIS — E7800 Pure hypercholesterolemia, unspecified: Secondary | ICD-10-CM | POA: Diagnosis not present

## 2020-03-06 DIAGNOSIS — E1065 Type 1 diabetes mellitus with hyperglycemia: Secondary | ICD-10-CM

## 2020-03-06 NOTE — Progress Notes (Signed)
Patient ID: Felicia Gardner, female   DOB: [birth year], 59 y.o.   MRN: [id number]           Reason for Appointment : Follow-up for Type 1 Diabetes    History of Present Illness          Diagnosis: Type 1 diabetes mellitus, date of diagnosis: 09/03/15        Diabetes  history:    She presented to the emergency room with weight loss, increased thirst and urination and had marked hyperglycemia  Labs in the emergency room showed >80 ketones in the urine and CO2 down to 15 On her initial consultation she was started on mealtime insulin with NovoLog and Lantus dose was increased to 12 units  RECENT history:    INSULIN regimen is: Medtronic pump since 12/09/16, now using 770 pump  Basal rate midnight = 0.40, 6 AM = 0.45, 8 AM = 0.35, 6 AM = 0.4 : Bolus settings carbohydrate coverage 1: 7 at breakfast and lunch and 1:6  at supper  Correction 1:50 and target 120, active insulin time 3.45 hours  Her A1C is 9% compared to 8.3   Fructosamine when A1c was 8.3 was 330:  She has had the auto mode 95 % of the time with the average insulin dose 30 units, 40 % is bolus amount   She still has a high A1c is not explained by her sensor data which shows an average of 157 and GMI of 7.1  Also has slightly better time in range compared to her last visit  Has a relatively lower fructosamine compared to her A1c  She does not show any consistent pattern with her postprandial readings although blood sugars after dinner are more variable  Blood sugars at 3 hours after meals are relatively flat compared to [place] readings with current active insulin time of 3.45 hours  Active insulin time was shortened slightly on her last visit  She has not had any significant exercise  However not having tendency to drop before lunch as before also  Compared to 3 months ago her blood sugars are more stable late morning, after lunch and after dinner  She was having more trouble with the sensor requiring  calibration with notifications during the night previously but this is less often  She is calibrating at 7:30 AM and 7:30 PM generally  Her Humalog appears to be working fairly quickly with the boluses and there is no initial spike after her meals  As before she will enter 8 units carbohydrate for bolusing for coffee in the morning and this helps  However blood sugar may trend higher before lunch   CONTINUOUS GLUCOSE MONITORING RECORD INTERPRETATION    Dates of Recording: Last 2 weeks  Sensor description:Guardian  Results statistics:   CGM use % of time  95  Average and SD  157+/-44  Time in range  73     %  % Time Above 180  21  % Time above 250  5  % Time Below target 1    PRE-MEAL  AC breakfast Lunch Dinner Bedtime Overall  Glucose range:  99-264      Mean/median:  132  146  163     POST-MEAL PC Breakfast PC Lunch PC Dinner  Glucose range:     Mean/median:  139  184  160    Glycemic patterns summary: Overall blood sugars are relatively stable throughout the day with no consistent hyperglycemic spikes. Most of the hyperglycemia is occurring  between about 3 PM-5 PM and occasionally 7-9 PM. Most days blood sugars are stable and improving between bedtime and waking up and no hypoglycemia overnight  Hyperglycemic episodes are occurring mostly in the late afternoon and occasionally around 7-8 PM  Hypoglycemic episodes occurred only rarely mid afternoon  Overnight periods: Blood sugars start off side average 180 at midnight and then progressively improve and get more stable.  No hypoglycemia  Preprandial periods: Blood sugars are averaging mildly increased at lunch and dinner with some variability  Postprandial periods:   No consistent patterns and blood sugars are fairly level compared to Premeal readings; usually not eating much at breakfast     CGM use % of time  95  Average and SD  159+/-50, GV 32  Time in range       69%  % Time Above 180  25  % Time above  250  5  % Time Below target  1    PRE-MEAL Fasting Lunch Dinner Bedtime Overall  Glucose range:       Mean/median:  138  166  158   159   POST-MEAL PC Breakfast PC Lunch PC Dinner  Glucose range:     Mean/median:  132  179  160     Self-care: The diet that the patient has been following is: generally low fat and moderate in carbohydrates   She is eating Breakfast at 8 am usually peanut butter crackers with coffee; dinner at 6-6:30 pm                      Generally active on weekends around the house  CDE consultation: 3/17 and 7/18  Wt Readings from Last 3 Encounters:  03/06/20 135 lb (61.2 kg)  09/15/18 140 lb 3.2 oz (63.6 kg)  09/02/18 143 lb (64.9 kg)   Diabetes labs:  Lab Results  Component Value Date   HGBA1C 9.0 (H) 03/01/2020   HGBA1C 8.3 (H) 07/14/2019   HGBA1C 8.8 (H) 03/08/2019   Lab Results  Component Value Date   MICROALBUR <0.7 03/01/2020   [place] 96 03/01/2020   CREATININE 0.79 03/01/2020    Lab Results  Component Value Date   MICRALBCREAT 1.1 03/01/2020   MICRALBCREAT 2.1 12/06/2018   Lab Results  Component Value Date   FRUCTOSAMINE 334 (H) 07/14/2019      Allergies as of 03/06/2020   No Known Allergies     Medication List       Accurate as of March 06, 2020  3:32 PM. If you have any questions, ask your nurse or doctor.        Accu-Chek Guide Me w/Device Kit 1 each by Does not apply route in the morning and at bedtime. Use as instructed to check blood sugar twice daily. DX:E10.65. Replaces Accu Chek link meter.   Accu-Chek Guide test strip Generic drug: glucose blood Use as instructed to test blood sugar 2 times daily E10.65   accu-chek soft touch lancets Use as instructed to test blood sugar 2 times daily E10.65   Alpha Lipoic Acid 200 MG Caps Take by mouth daily.   Chromium Picolinate 1000 MCG Tabs Take by mouth.   Cinnamon 500 MG capsule Take 500 mg by mouth daily.   insulin lispro 100 UNIT/ML  injection Commonly known as: HumaLOG USE UP TO 50 UNITS DAILY PER INSULIN PUMP.   Insulin Pen Needle 32G X 5 MM Misc Use 4 per day to inject Insulin   LUTEIN 20  PO Take by mouth.   multivitamin with minerals Tabs tablet Take 1 tablet by mouth daily.   rosuvastatin 5 MG tablet Commonly known as: CRESTOR TAKE 1 TABLET BY MOUTH EVERY DAY   vitamin B-12 1000 MCG tablet Commonly known as: CYANOCOBALAMIN Take 1,000 mcg by mouth daily.   VITAMIN D-3 PO Take by mouth daily.       Allergies: No Known Allergies  Past Medical History:  Diagnosis Date  . Diabetes mellitus without complication ([place]) 06/6551   diagnosed    Past Surgical History:  Procedure Laterality Date  . BUNIONECTOMY  2011   right foot  . TUBAL LIGATION  1990    Family History  Problem Relation Age of Onset  . Heart disease Mother   . Graves' disease Mother   . Thyroid disease Sister   . Thyroid disease Maternal Grandmother   . Diabetes Neg Hx   . Hypertension Father   . Crohn's disease Father   . Rheum arthritis Father   . Thyroid disease Sister     Social History:  reports that she has been smoking cigarettes. She has a 41.00 pack-year smoking history. She has never used smokeless tobacco. She reports that she does not drink alcohol and does not use drugs.    Review of Systems         Lipids: She has been having good control with Crestor 5 mg daily  LDL was previously 77 and is again 96  Lab Results  Component Value Date   CHOL 188 03/01/2020   CHOL 186 03/08/2019   CHOL 170 08/30/2018   Lab Results  Component Value Date   HDL 77.90 03/01/2020   HDL 76.50 03/08/2019   HDL 72.70 08/30/2018   Lab Results  Component Value Date   LDLCALC 96 03/01/2020   LDLCALC 96 03/08/2019   LDLCALC 77 08/30/2018   Lab Results  Component Value Date   TRIG 69.0 03/01/2020   TRIG 70.0 03/08/2019   TRIG 101.0 08/30/2018   Lab Results  Component Value Date   CHOLHDL 2 03/01/2020   CHOLHDL  2 03/08/2019   CHOLHDL 2 08/30/2018   Lab Results  Component Value Date   LDLDIRECT 134.0 11/13/2017   Lab Results  Component Value Date   ALT 19 03/01/2020         Thyroid:   In the past she had TSH done because of fatigue and weight loss which occurred for other reasons Had a transient increase in TSH in 3/17, back to normal subsequently TSH has been upper normal consistently  She has a family history of thyroid disease   Lab Results  Component Value Date   TSH 4.19 03/01/2020   TSH 4.07 03/08/2019   TSH 4.25 05/24/2018   FREET4 0.56 (L) 05/24/2018   FREET4 0.59 (L) 08/13/2017   FREET4 0.58 (L) 03/13/2016     Physical Examination:  BP 106/82   Pulse 79   Wt 135 lb (61.2 kg)   SpO2 96%   BMI 24.69 kg/m           ASSESSMENT:  Diabetes type 1, date of diagnosis 2017  See history of present illness for detailed discussion of current diabetes management, blood sugar patterns and problems identified  Her blood sugar patterns and daily insulin pump management was reviewed in detail from her insulin pump download analysis as above  Her A1c is higher than expected at 9% compared to 8.3  As before her CGM average is generally lower  than expected from her A1c and GMI is 7.1 Fructosamine previously was 334 indicating an A1c of about 7.5 compared to actual reading of 8.3 Her day-to-day patterns, abnormal events and periods of high and low sugars were discussed Also calibration issues were discussed Currently bolus settings, active insulin time and use of her sensor is adequate  Urine microalbumin normal  High normal TSH: Unchanged  LIPIDS: LDL is below 100  PLAN:   No change in settings needed as yet More regular follow-up She will need to adjust her mealtime boluses according to her type of meal including extra for eating out She should exercise regularly Try to calibrate before breakfast and dinner and also bedtime daily  Will also check fructosamine  on her next visit to correlate with her actual level of control  Follow-up in 3 months     There are no Patient Instructions on file for this visit.     Elayne Snare 03/06/2020, 3:32 PM   Note: This note was prepared with Dragon voice recognition system technology. Any transcriptional errors that result from this process are unintentional.

## 2020-03-12 ENCOUNTER — Other Ambulatory Visit: Payer: Self-pay | Admitting: Endocrinology

## 2020-03-26 ENCOUNTER — Other Ambulatory Visit: Payer: Self-pay | Admitting: Endocrinology

## 2020-03-31 ENCOUNTER — Other Ambulatory Visit: Payer: Self-pay | Admitting: Endocrinology

## 2020-04-30 ENCOUNTER — Other Ambulatory Visit: Payer: Self-pay | Admitting: *Deleted

## 2020-04-30 ENCOUNTER — Telehealth: Payer: Self-pay | Admitting: Endocrinology

## 2020-04-30 MED ORDER — INSULIN LISPRO 100 UNIT/ML ~~LOC~~ SOLN: SUBCUTANEOUS | 0 refills | Status: DC

## 2020-04-30 MED ORDER — ROSUVASTATIN CALCIUM 5 MG PO TABS: 5.0000 mg | ORAL_TABLET | Freq: Every day | ORAL | 0 refills | Status: DC

## 2020-04-30 NOTE — Telephone Encounter (Signed)
Patient called to advise that she is now required to have her all of her RX filled by the mail in pharmacy [facility] fax # [phone].  She needs 90 day RX sent to them for Humalog and Rosuvastatin 5 MG

## 2020-04-30 NOTE — Telephone Encounter (Signed)
Her pharmacy has been added and rxs have been sent.

## 2020-05-28 ENCOUNTER — Other Ambulatory Visit: Payer: Self-pay

## 2020-05-28 ENCOUNTER — Other Ambulatory Visit: Payer: Self-pay | Admitting: *Deleted

## 2020-05-28 ENCOUNTER — Telehealth: Payer: Self-pay | Admitting: *Deleted

## 2020-05-28 ENCOUNTER — Other Ambulatory Visit: Payer: Self-pay | Admitting: Endocrinology

## 2020-05-28 MED ORDER — ROSUVASTATIN CALCIUM 5 MG PO TABS
5.0000 mg | ORAL_TABLET | Freq: Every day | ORAL | 0 refills | Status: DC
Start: 2020-05-28 — End: 2020-06-04

## 2020-05-28 MED ORDER — INSULIN LISPRO 100 UNIT/ML ~~LOC~~ SOLN
SUBCUTANEOUS | 0 refills | Status: DC
Start: 2020-05-28 — End: 2020-05-28

## 2020-05-28 MED ORDER — INSULIN LISPRO 100 UNIT/ML ~~LOC~~ SOLN: SUBCUTANEOUS | 0 refills | Status: DC

## 2020-05-28 MED ORDER — INSULIN LISPRO 100 UNIT/ML ~~LOC~~ SOLN
SUBCUTANEOUS | 0 refills | Status: DC
Start: 2020-05-28 — End: 2020-06-04

## 2020-05-28 MED ORDER — ROSUVASTATIN CALCIUM 5 MG PO TABS
5.0000 mg | ORAL_TABLET | Freq: Every day | ORAL | 0 refills | Status: DC
Start: 2020-05-28 — End: 2020-05-28

## 2020-05-28 MED ORDER — ROSUVASTATIN CALCIUM 5 MG PO TABS: 5.0000 mg | ORAL_TABLET | Freq: Every day | ORAL | 0 refills | Status: DC

## 2020-05-28 NOTE — Telephone Encounter (Signed)
Hey, I tried to send these- but then realized they wanted them faxed, and knew that Dr.Kumar would have to sign them., just wanted you to know I didn't get them sent.   Thank you!

## 2020-05-28 NOTE — Telephone Encounter (Signed)
Can you please see if you can print these prescriptions out, I have tried three times and they won't print. She wants them faxed to a different place. thanks

## 2020-05-28 NOTE — Telephone Encounter (Signed)
Patient called requesting humalog 90 day and rousvastin 90 days.   Please fax to [phone]

## 2020-05-28 NOTE — Telephone Encounter (Signed)
Prescriptions are on the printer

## 2020-06-04 ENCOUNTER — Telehealth: Payer: Self-pay | Admitting: *Deleted

## 2020-06-04 ENCOUNTER — Other Ambulatory Visit: Payer: Self-pay | Admitting: Endocrinology

## 2020-06-04 MED ORDER — ROSUVASTATIN CALCIUM 5 MG PO TABS
5.0000 mg | ORAL_TABLET | Freq: Every day | ORAL | 0 refills | Status: DC
Start: 2020-06-04 — End: 2020-06-05

## 2020-06-04 MED ORDER — INSULIN LISPRO 100 UNIT/ML ~~LOC~~ SOLN
SUBCUTANEOUS | 0 refills | Status: DC
Start: 2020-06-04 — End: 2020-06-05

## 2020-06-04 NOTE — Telephone Encounter (Signed)
See below

## 2020-06-04 NOTE — Telephone Encounter (Signed)
We need to send a fax directly to the pharmacy

## 2020-06-05 ENCOUNTER — Other Ambulatory Visit: Payer: Self-pay | Admitting: *Deleted

## 2020-06-05 MED ORDER — INSULIN LISPRO 100 UNIT/ML ~~LOC~~ SOLN
SUBCUTANEOUS | 1 refills | Status: DC
Start: 2020-06-05 — End: 2020-06-13

## 2020-06-05 MED ORDER — ROSUVASTATIN CALCIUM 5 MG PO TABS
5.0000 mg | ORAL_TABLET | Freq: Every day | ORAL | 1 refills | Status: DC
Start: 2020-06-05 — End: 2021-02-06

## 2020-06-05 NOTE — Telephone Encounter (Signed)
Noted, pharmacy added to patients list and rx's have been sent

## 2020-06-05 NOTE — Telephone Encounter (Signed)
Patient called and advised that her: Rosuvastatin Calcium 5 mg Oral Daily Insulin Lispro USE UP TO 50 UNITS DAILY PER INSULIN PUMP.  Need to be sent to [facility] Ph# [phone] Fax# [phone]

## 2020-06-05 NOTE — Telephone Encounter (Signed)
Patient called the office with the correct name of the pharmacy and fax number, rx's have been directly faxed to them.

## 2020-06-12 ENCOUNTER — Telehealth: Payer: Self-pay | Admitting: Endocrinology

## 2020-06-12 NOTE — Telephone Encounter (Signed)
Patient called back. Inform patient that we have always sent with generic. Patient did not know and gave okay to leave as is

## 2020-06-12 NOTE — Telephone Encounter (Signed)
[name] with [facility] ph# [phone] called re: Felicia Gardner received RX for Humalog, however patient told Pharm that she requests the name brand Humalog-not generic. If Dr. Dwyane Dee prescribes Patient name brand Humalog the PHARM will need a new RX.

## 2020-06-13 MED ORDER — HUMALOG 100 UNIT/ML ~~LOC~~ SOLN: SUBCUTANEOUS | 2 refills | Status: DC

## 2020-06-13 NOTE — Addendum Note (Signed)
Addended by: Jacqualin Combes on: 06/13/2020 03:01 PM   Modules accepted: Orders

## 2020-06-13 NOTE — Telephone Encounter (Signed)
Spoken to Felicia Gardner from [facility] and was told that insurance will only cover brand name Humalog. I have sent it as requested.

## 2020-07-05 ENCOUNTER — Encounter: Payer: Self-pay | Admitting: Endocrinology

## 2020-07-06 ENCOUNTER — Other Ambulatory Visit (INDEPENDENT_AMBULATORY_CARE_PROVIDER_SITE_OTHER): Payer: No Typology Code available for payment source

## 2020-07-06 ENCOUNTER — Other Ambulatory Visit: Payer: Self-pay

## 2020-07-06 DIAGNOSIS — E1065 Type 1 diabetes mellitus with hyperglycemia: Secondary | ICD-10-CM | POA: Diagnosis not present

## 2020-07-06 LAB — BASIC METABOLIC PANEL
BUN: 11 mg/dL (ref 6–23)
CO2: 31 mEq/L (ref 19–32)
Calcium: 10.1 mg/dL (ref 8.4–10.5)
Chloride: 102 mEq/L (ref 96–112)
Creatinine, Ser: 0.77 mg/dL (ref 0.40–1.20)
GFR: 83.46 mL/min (ref >60.00)
Glucose, Bld: 127 mg/dL — ABNORMAL HIGH (ref 70–99)
Potassium: 4 mEq/L (ref 3.5–5.1)
Sodium: 139 mEq/L (ref 135–145)

## 2020-07-06 LAB — HEMOGLOBIN A1C: Hgb A1c MFr Bld: 8.8 % — ABNORMAL HIGH (ref 4.6–6.5)

## 2020-07-09 ENCOUNTER — Other Ambulatory Visit: Payer: No Typology Code available for payment source

## 2020-07-12 ENCOUNTER — Ambulatory Visit: Payer: No Typology Code available for payment source | Admitting: Endocrinology

## 2020-07-12 ENCOUNTER — Encounter: Payer: Self-pay | Admitting: Endocrinology

## 2020-07-12 ENCOUNTER — Other Ambulatory Visit: Payer: Self-pay

## 2020-07-12 VITALS — BP 126/84 | HR 72 | Ht 62.0 in | Wt 132.4 lb

## 2020-07-12 DIAGNOSIS — E1065 Type 1 diabetes mellitus with hyperglycemia: Secondary | ICD-10-CM | POA: Diagnosis not present

## 2020-07-12 DIAGNOSIS — E7800 Pure hypercholesterolemia, unspecified: Secondary | ICD-10-CM | POA: Diagnosis not present

## 2020-07-12 NOTE — Progress Notes (Signed)
Patient ID: Felicia Gardner, female   DOB: [birth year], 60 y.o.   MRN: [id number]           Reason for Appointment : Follow-up for Type 1 Diabetes    History of Present Illness          Diagnosis: Type 1 diabetes mellitus, date of diagnosis: 09/03/15        Diabetes  history:    She presented to the emergency room with weight loss, increased thirst and urination and had marked hyperglycemia  Labs in the emergency room showed >80 ketones in the urine and CO2 down to 15 On her initial consultation she was started on mealtime insulin with NovoLog and Lantus dose was increased to 12 units  RECENT history:    INSULIN regimen is: Medtronic pump since 12/09/16, now using 770 pump  Basal rate midnight = 0.40, 6 AM = 0.45, 8 AM = 0.35, 6 AM = 0.4 : Bolus settings carbohydrate coverage 1: 7 at breakfast and lunch and 1:6  at supper  Correction 1:50 and target 120, active insulin time 3.45 hours  Her A1C is 8.8, lowest recently 8.3 from [facility]   Fructosamine when A1c was 8.3 was 330:  She has had the auto mode 95 % of the time with the average insulin dose 30 units, 40 % is bolus amount   She still has a high A1c is not explained by her sensor data which shows an average of 157 and GMI of 7.1  Also has slightly better time in range compared to her last visit  Has a relatively lower fructosamine compared to her A1c  Current blood sugar patterns from CGM analysis:  Blood sugar patterns indicate gradually increasing blood sugars early morning until midday and then progressive decline until 6 PM and then again rising to about 180 at which after dinner  She has some tendency to dawn phenomenon which is inconsistent  HYPERGLYCEMIA is occurring usually following low normal blood sugars at different times but not consistently after meals; also had high readings once when she was out of the auto mode  OVERNIGHT blood sugars are very stable and within the target range usually  She has some  tendency to low blood sugars late afternoon and before dinnertime even without additional boluses  Overall HYPOGLYCEMIA has been transient and minimal  POSTPRANDIAL readings appear to be low normal by 3 hours after lunch but variably high after dinner; increase in blood sugar after dinner is about 35 mg tablets   She is still trying to bolus for 8 g of carbohydrate for breakfast especially when blood sugars are trending to rise but on some days blood sugars still rise unexpectedly in the mornings  She thinks she gets low sugars late afternoon and before dinnertime despite not being consistently active  Sometimes will reduce her boluses at lunch to avoid low sugars  Not doing much formal exercise  She has not had her sensor for the last week and she thinks her fasting readings are around 200 because of this   CGM use % of time  91  2-week average/GV  149+/-50  Time in range     76   %  % Time Above 180  18  % Time above 250  5  % Time Below 70  1     PRE-MEAL Fasting Lunch Dinner Bedtime Overall  Glucose range:        Averages: 173  160  138  124/132  POST-MEAL PC Breakfast PC Lunch PC Dinner  Glucose range:     Averages:  172  180  173   Previous data:   CGM use % of time  95  Average and SD  157+/-44  Time in range  73     %  % Time Above 180  21  % Time above 250  5  % Time Below target 1    PRE-MEAL  AC breakfast Lunch Dinner Bedtime Overall  Glucose range:  99-264      Mean/median:  132  146  163     POST-MEAL PC Breakfast PC Lunch PC Dinner  Glucose range:     Mean/median:  139  184  160    Glycemic patterns summary: Overall blood sugars are relatively stable throughout the day with no consistent hyperglycemic spikes. Most of the hyperglycemia is occurring between about 3 PM-5 PM and occasionally 7-9 PM. Most days blood sugars are stable and improving between bedtime and waking up and no hypoglycemia overnight  Postprandial periods:   No consistent  patterns and blood sugars are fairly level compared to [place] readings; usually not eating much at breakfast   Self-care: The diet that the patient has been following is: generally low fat and moderate in carbohydrates   She is eating Breakfast at 8 am usually peanut butter crackers with coffee; dinner at 6-6:30 pm                      Generally active on weekends around the house  CDE consultation: 3/17 and 7/18  Wt Readings from Last 3 Encounters:  07/12/20 132 lb 6.4 oz (60.1 kg)  03/06/20 135 lb (61.2 kg)  09/15/18 140 lb 3.2 oz (63.6 kg)   Diabetes labs:  Lab Results  Component Value Date   HGBA1C 8.8 (H) 07/06/2020   HGBA1C 9.0 (H) 03/01/2020   HGBA1C 8.3 (H) 07/14/2019   Lab Results  Component Value Date   MICROALBUR <0.7 03/01/2020   [place] 96 03/01/2020   CREATININE 0.77 07/06/2020    Lab Results  Component Value Date   MICRALBCREAT 1.1 03/01/2020   MICRALBCREAT 2.1 12/06/2018   Lab Results  Component Value Date   FRUCTOSAMINE 334 (H) 07/14/2019      Allergies as of 07/12/2020   No Known Allergies     Medication List       Accurate as of July 12, 2020 11:59 PM. If you have any questions, ask your nurse or doctor.        Accu-Chek Guide Me w/Device Kit 1 each by Does not apply route in the morning and at bedtime. Use as instructed to check blood sugar twice daily. DX:E10.65. Replaces Accu Chek link meter.   Accu-Chek Guide test strip Generic drug: glucose blood Use as instructed to test blood sugar 2 times daily E10.65   accu-chek soft touch lancets Use as instructed to test blood sugar 2 times daily E10.65   Alpha Lipoic Acid 200 MG Caps Take by mouth daily.   Chromium Picolinate 1000 MCG Tabs Take by mouth.   Cinnamon 500 MG capsule Take 500 mg by mouth daily.   HumaLOG 100 UNIT/ML injection Generic drug: insulin lispro USE UP TO 50 UNITS DAILY PER INSULIN PUMP.   Insulin Pen Needle 32G X 5 MM Misc Use 4 per day to inject  Insulin   LUTEIN 20 PO Take by mouth.   multivitamin with minerals Tabs tablet Take 1 tablet by mouth  daily.   rosuvastatin 5 MG tablet Commonly known as: CRESTOR Take 1 tablet (5 mg total) by mouth daily.   vitamin B-12 1000 MCG tablet Commonly known as: CYANOCOBALAMIN Take 1,000 mcg by mouth daily.   VITAMIN D-3 PO Take by mouth daily.       Allergies: No Known Allergies  Past Medical History:  Diagnosis Date  . Diabetes mellitus without complication ([place]) 0/0370   diagnosed    Past Surgical History:  Procedure Laterality Date  . BUNIONECTOMY  2011   right foot  . TUBAL LIGATION  1990    Family History  Problem Relation Age of Onset  . Heart disease Mother   . Graves' disease Mother   . Thyroid disease Sister   . Thyroid disease Maternal Grandmother   . Diabetes Neg Hx   . Hypertension Father   . Crohn's disease Father   . Rheum arthritis Father   . Thyroid disease Sister     Social History:  reports that she has been smoking cigarettes. She has a 41.00 pack-year smoking history. She has never used smokeless tobacco. She reports that she does not drink alcohol and does not use drugs.    Review of Systems         Lipids: She has been having good control with Crestor 5 mg daily  LDL was previously 77 and is again 96  Lab Results  Component Value Date   CHOL 188 03/01/2020   CHOL 186 03/08/2019   CHOL 170 08/30/2018   Lab Results  Component Value Date   HDL 77.90 03/01/2020   HDL 76.50 03/08/2019   HDL 72.70 08/30/2018   Lab Results  Component Value Date   LDLCALC 96 03/01/2020   LDLCALC 96 03/08/2019   LDLCALC 77 08/30/2018   Lab Results  Component Value Date   TRIG 69.0 03/01/2020   TRIG 70.0 03/08/2019   TRIG 101.0 08/30/2018   Lab Results  Component Value Date   CHOLHDL 2 03/01/2020   CHOLHDL 2 03/08/2019   CHOLHDL 2 08/30/2018   Lab Results  Component Value Date   LDLDIRECT 134.0 11/13/2017   Lab Results  Component  Value Date   ALT 19 03/01/2020         Thyroid:   In the past she had TSH done because of fatigue and weight loss which occurred for other reasons Had a transient increase in TSH in 3/17, back to normal subsequently TSH has been upper normal consistently  She has a family history of thyroid disease   Lab Results  Component Value Date   TSH 4.19 03/01/2020   TSH 4.07 03/08/2019   TSH 4.25 05/24/2018   FREET4 0.56 (L) 05/24/2018   FREET4 0.59 (L) 08/13/2017   FREET4 0.58 (L) 03/13/2016     Physical Examination:  BP 126/84   Pulse 72   Ht _0  (1.575 m)   Wt 132 lb 6.4 oz (60.1 kg)   SpO2 99%   BMI 24.22 kg/m           ASSESSMENT:  Diabetes type 1, date of diagnosis 2017  See history of present illness for detailed discussion of current diabetes management, blood sugar patterns and problems identified  Her blood sugar patterns and daily insulin pump management was reviewed in detail from her insulin pump download analysis as above  Her A1c is again higher than expected at 8.8, current GMI 6.9  Despite being in the auto mode most of the time her blood  sugars do tend to fluctuate She does appear to be overall still requiring very low doses of insulin, totaling about 12 units in basal  She does have a tendency to variable degree of hyperglycemia in the morning hours even without any food intake Also has some occasional unexpected low sugars especially mid or late afternoon Some of her hyperglycemia is related to rebound from low normal sugars Postprandial readings are overall well controlled with no consistent pattern   PLAN:   She will change her manual basals to 0.45 at midnight and if fasting readings are still higher than manual mode low up to 0.5  Reduce carbohydrate coverage to 1: 7 at lunchtime to reduce some tendency to low normal sugars in the afternoon Also since blood sugars are the lowest before dinnertime with some hypoglycemia she will go down to  a temporary target when she is leaving work in the mid afternoon until 6:00 Again reminded her to bolus before starting to eat at least a few minutes  Need to calibrate before breakfast and dinner and also bedtime daily Hopefully can be eligible for 780 pump when available  Will also check fructosamine on her next visit and try rechecking A1c from [facility]  Follow-up in 3 months     Patient Instructions  12 am basal 0.45 and if needed go to 0.50        Elayne Snare 07/13/2020, 10:09 AM   Note: This note was prepared with Dragon voice recognition system technology. Any transcriptional errors that result from this process are unintentional.

## 2020-07-12 NOTE — Patient Instructions (Signed)
12 am basal 0.45 and if needed go to 0.50

## 2020-10-01 ENCOUNTER — Telehealth: Payer: Self-pay | Admitting: Endocrinology

## 2020-10-01 NOTE — Telephone Encounter (Signed)
Patient called stating she got a letter from her insurance stating they could not process the request for her sensors because they did not have enough information.  They are requesting the last office notes for medical necessity, asking to fax it to [phone] (attn: medical management). Phone number for them is [phone] if we have questions.

## 2020-10-01 NOTE — Telephone Encounter (Signed)
Faxed [facility] [phone]

## 2020-10-08 ENCOUNTER — Other Ambulatory Visit: Payer: Self-pay

## 2020-10-08 ENCOUNTER — Other Ambulatory Visit (INDEPENDENT_AMBULATORY_CARE_PROVIDER_SITE_OTHER): Payer: No Typology Code available for payment source

## 2020-10-08 DIAGNOSIS — E1065 Type 1 diabetes mellitus with hyperglycemia: Secondary | ICD-10-CM

## 2020-10-08 LAB — HEMOGLOBIN A1C: Hgb A1c MFr Bld: 8.2 % — ABNORMAL HIGH (ref 4.6–6.5)

## 2020-10-08 LAB — GLUCOSE, RANDOM: Glucose, Bld: 270 mg/dL — ABNORMAL HIGH (ref 70–99)

## 2020-10-09 ENCOUNTER — Other Ambulatory Visit: Payer: No Typology Code available for payment source

## 2020-10-11 ENCOUNTER — Encounter: Payer: Self-pay | Admitting: Endocrinology

## 2020-10-11 ENCOUNTER — Other Ambulatory Visit: Payer: Self-pay

## 2020-10-11 ENCOUNTER — Ambulatory Visit: Payer: No Typology Code available for payment source | Admitting: Endocrinology

## 2020-10-11 VITALS — BP 114/74 | HR 76 | Ht 62.0 in | Wt 132.0 lb

## 2020-10-11 DIAGNOSIS — E1065 Type 1 diabetes mellitus with hyperglycemia: Secondary | ICD-10-CM | POA: Diagnosis not present

## 2020-10-11 DIAGNOSIS — E7800 Pure hypercholesterolemia, unspecified: Secondary | ICD-10-CM | POA: Diagnosis not present

## 2020-10-11 DIAGNOSIS — E038 Other specified hypothyroidism: Secondary | ICD-10-CM

## 2020-10-11 MED ORDER — LYUMJEV 100 UNIT/ML IJ SOLN: INTRAMUSCULAR | 0 refills | Status: DC

## 2020-10-11 NOTE — Progress Notes (Signed)
Patient ID: Felicia Gardner, female   DOB: 04/03/1961, 60 y.o.   MRN: [id number]           Reason for Appointment : Follow-up for Type 1 Diabetes    History of Present Illness          Diagnosis: Type 1 diabetes mellitus, date of diagnosis: 09/03/15        Diabetes  history:    She presented to the emergency room with weight loss, increased thirst and urination and had marked hyperglycemia  Labs in the emergency room showed >80 ketones in the urine and CO2 down to 15 On her initial consultation she was started on mealtime insulin with NovoLog and Lantus dose was increased to 12 units  RECENT history:    INSULIN regimen is: Medtronic pump since 12/09/16, now using 770 pump  Basal rate midnight = 0.45, 6 AM = 0.45, 8 AM = 0.35, 6 AM = 0.4 : Bolus settings carbohydrate coverage 1: 7 at breakfast and lunch and 1:6  at supper  Correction 1:50 and target 120, active insulin time 3.45 hours  Her A1C is better at 8.2, was 8.8, lowest previously 8.3 from [facility]   Fructosamine when A1c was 8.3 was 330:  She has had the auto mode 99 % of the time with the average insulin dose 27 units, 40 % is bolus amount   She still has a high A1c although better than before  Changes made on the last visit: Carbohydrate coverage at lunch was 1: 7  Current blood sugar patterns from CGM analysis:  Hyperglycemic episodes are occurring 1.9 times a day and hypoglycemic episodes every other day  Hyperglycemic patterns are seen 1-2pm, 7-8 PM and 9-10 PM  Hypoglycemic pattern was seen 3 times between 4-5 PM  Compared to her last visit her blood sugars are appearing to be more stable between 6 AM and lunchtime, previously were going higher  She has very significant variability between 2 PM-9 PM with inconsistent pattern  POSTPRANDIAL readings after lunch are highly variable with occasional significant rise in blood sugar, couple of times getting low mid afternoon after the bolus but occasionally very  normal  Generally not eating breakfast  Blood sugars after dinner on an average fairly good with only rare episodes of late hyperglycemia possibly from higher fat meal   She is concerned about some tendency to low sugars before dinnertime possibly when she is more active at work  She is still not doing the temporary target that was suggested on those days  She has had at least 1 missed boluses at lunch causing high sugars  She is usually trying to bolus when she is serving her food and not ahead of time  Also couple of days ago had a significantly high reading around dinnertime, not clear if this was missed bolus  Only on that occasion when she had excessive overcorrection with her boluses otherwise has good response to correction of high reading  Has been auto mode fairly consistently and only transiently had sitting because of sensor algorithm under read  No excessive hypoglycemia otherwise  Blood sugar data from CGM:  CGM use % of time  99  2-week average/GV  148+/-47  Time in range     75   %  % Time Above 180  21  % Time above 250  2  % Time Below 70      PRE-MEAL Fasting Lunch Dinner Bedtime Overall  Glucose range:  Averages:  147  160  151     POST-MEAL PC Breakfast PC Lunch PC Dinner  Glucose range:     Averages:  148  180  167   Previous data:   CGM use % of time  91  2-week average/GV  149+/-50  Time in range     76   %  % Time Above 180  18  % Time above 250  5  % Time Below 70  1     PRE-MEAL Fasting Lunch Dinner Bedtime Overall  Glucose range:        Averages: 173  160  138  124/132    POST-MEAL PC Breakfast PC Lunch PC Dinner  Glucose range:     Averages:  172  180  173     Self-care: The diet that the patient has been following is: generally low fat and moderate in carbohydrates   She is eating Breakfast at 8 am usually peanut butter crackers with coffee; dinner at 6-6:30 pm                      Generally active on weekends around  the house  CDE consultation: 3/17 and 7/18  Wt Readings from Last 3 Encounters:  10/11/20 132 lb (59.9 kg)  07/12/20 132 lb 6.4 oz (60.1 kg)  03/06/20 135 lb (61.2 kg)   Diabetes labs:  Lab Results  Component Value Date   HGBA1C 8.2 (H) 10/08/2020   HGBA1C 8.8 (H) 07/06/2020   HGBA1C 9.0 (H) 03/01/2020   Lab Results  Component Value Date   MICROALBUR <0.7 03/01/2020   [place] 96 03/01/2020   CREATININE 0.77 07/06/2020    Lab Results  Component Value Date   MICRALBCREAT 1.1 03/01/2020   MICRALBCREAT 2.1 12/06/2018   Lab Results  Component Value Date   FRUCTOSAMINE 334 (H) 07/14/2019      Allergies as of 10/11/2020   No Known Allergies     Medication List       Accurate as of October 11, 2020  4:39 PM. If you have any questions, ask your nurse or doctor.        Accu-Chek Guide Me w/Device Kit 1 each by Does not apply route in the morning and at bedtime. Use as instructed to check blood sugar twice daily. DX:E10.65. Replaces Accu Chek link meter.   Accu-Chek Guide test strip Generic drug: glucose blood Use as instructed to test blood sugar 2 times daily E10.65   accu-chek soft touch lancets Use as instructed to test blood sugar 2 times daily E10.65   Alpha Lipoic Acid 200 MG Caps Take by mouth daily.   Chromium Picolinate 1000 MCG Tabs Take by mouth.   Cinnamon 500 MG capsule Take 500 mg by mouth daily.   HumaLOG 100 UNIT/ML injection Generic drug: insulin lispro USE UP TO 50 UNITS DAILY PER INSULIN PUMP.   Insulin Pen Needle 32G X 5 MM Misc Use 4 per day to inject Insulin   LUTEIN 20 PO Take by mouth.   Lyumjev 100 UNIT/ML Soln Generic drug: Insulin Lispro-aabc sample Started by: Elayne Snare, MD   multivitamin with minerals Tabs tablet Take 1 tablet by mouth daily.   rosuvastatin 5 MG tablet Commonly known as: CRESTOR Take 1 tablet (5 mg total) by mouth daily.   vitamin B-12 1000 MCG tablet Commonly known as: CYANOCOBALAMIN Take 1,000  mcg by mouth daily.   VITAMIN D-3 PO Take by mouth daily.  Allergies: No Known Allergies  Past Medical History:  Diagnosis Date  . Diabetes mellitus without complication ([place]) 11/3843   diagnosed    Past Surgical History:  Procedure Laterality Date  . BUNIONECTOMY  2011   right foot  . TUBAL LIGATION  1990    Family History  Problem Relation Age of Onset  . Heart disease Mother   . Graves' disease Mother   . Thyroid disease Sister   . Thyroid disease Maternal Grandmother   . Diabetes Neg Hx   . Hypertension Father   . Crohn's disease Father   . Rheum arthritis Father   . Thyroid disease Sister     Social History:  reports that she has been smoking cigarettes. She has a 41.00 pack-year smoking history. She has never used smokeless tobacco. She reports that she does not drink alcohol and does not use drugs.    Review of Systems         Lipids: She has been having good control with Crestor 5 mg daily  LDL last under 100  Lab Results  Component Value Date   CHOL 188 03/01/2020   CHOL 186 03/08/2019   CHOL 170 08/30/2018   Lab Results  Component Value Date   HDL 77.90 03/01/2020   HDL 76.50 03/08/2019   HDL 72.70 08/30/2018   Lab Results  Component Value Date   LDLCALC 96 03/01/2020   LDLCALC 96 03/08/2019   LDLCALC 77 08/30/2018   Lab Results  Component Value Date   TRIG 69.0 03/01/2020   TRIG 70.0 03/08/2019   TRIG 101.0 08/30/2018   Lab Results  Component Value Date   CHOLHDL 2 03/01/2020   CHOLHDL 2 03/08/2019   CHOLHDL 2 08/30/2018   Lab Results  Component Value Date   LDLDIRECT 134.0 11/13/2017   Lab Results  Component Value Date   ALT 19 03/01/2020         Thyroid:   In the past she had TSH done because of fatigue and weight loss which occurred for other reasons Had a transient increase in TSH in 3/17, back to normal subsequently TSH has been upper normal consistently  She has a family history of thyroid  disease   Lab Results  Component Value Date   TSH 4.19 03/01/2020   TSH 4.07 03/08/2019   TSH 4.25 05/24/2018   FREET4 0.56 (L) 05/24/2018   FREET4 0.59 (L) 08/13/2017   FREET4 0.58 (L) 03/13/2016     Physical Examination:  BP 114/74   Pulse 76   Ht 5' 2"  (1.575 m)   Wt 132 lb (59.9 kg)   SpO2 98%   BMI 24.14 kg/m           ASSESSMENT:  Diabetes type 1, date of diagnosis 2017  See history of present illness for detailed discussion of current diabetes management, blood sugar patterns and problems identified  Her blood sugar patterns and daily insulin pump management was reviewed in detail from her insulin pump download analysis as above  Her A1c is again higher than expected at 8.8, recent GMI 6.9  She will have less fluctuation in her blood sugars and less hyperglycemia late morning compared to previous visit However blood sugars in the afternoons are erratic either from inadequate or late boluses, missed boluses, low readings sometimes from increased physical activity and not being able to judge coverage for lunch No excessive hypoglycemia Postprandial readings are trending lower by 3 hours but only after lunch   PLAN:  She will try to upgrade to the 780 pump when available No change in settings at this time Trial of LYUMJEV instead of Humalog, sample given Discussed that this may help with postprandial hyperglycemia better and also potentially reduce less late afternoon hypoglycemia She will use a temporary target when being more active at work especially late afternoon or when driving home from work If she is continuing Humalog she may need to bolus 10 minutes before the meal May need extra coverage or correction boluses when eating any higher fat meals especially in the evenings  Follow-up in 3 months  Patient Instructions  Temp target when active in afternoon or when going home         Elayne Snare 10/11/2020, 4:39 PM   Note: This note was  prepared with Dragon voice recognition system technology. Any transcriptional errors that result from this process are unintentional.

## 2020-10-11 NOTE — Patient Instructions (Addendum)
Temp target when active in afternoon or when going home

## 2021-02-04 ENCOUNTER — Telehealth: Payer: Self-pay | Admitting: Endocrinology

## 2021-02-04 DIAGNOSIS — E1065 Type 1 diabetes mellitus with hyperglycemia: Secondary | ICD-10-CM

## 2021-02-04 DIAGNOSIS — E7800 Pure hypercholesterolemia, unspecified: Secondary | ICD-10-CM

## 2021-02-04 NOTE — Telephone Encounter (Signed)
MEDICATION: rosuvastatin (CRESTOR) 5 MG tablet  AND HUMALOG 100 UNIT/ML injection  PHARMACY:  [facility]-new address in [place]-ph# [phone]  HAS THE PATIENT CONTACTED THEIR PHARMACY?  Yes-Patient states PHARM has sent requests  IS THIS A 90 DAY SUPPLY : Yes-both  IS PATIENT OUT OF MEDICATION: No  IF NOT; HOW MUCH IS LEFT: Approx. 1 week of rosuvastatin  LAST APPOINTMENT DATE: '@4'$ /21/2022  NEXT APPOINTMENT DATE:'@8'$ /23/2022  DO WE HAVE YOUR PERMISSION TO LEAVE A DETAILED MESSAGE?: Yes  OTHER COMMENTS:    **Let patient know to contact pharmacy at the end of the day to make sure medication is ready. **  ** Please notify patient to allow 48-72 hours to process**  **Encourage patient to contact the pharmacy for refills or they can request refills through [facility]**

## 2021-02-06 MED ORDER — HUMALOG 100 UNIT/ML ~~LOC~~ SOLN: SUBCUTANEOUS | 2 refills | Status: DC

## 2021-02-06 MED ORDER — ROSUVASTATIN CALCIUM 5 MG PO TABS: 5.0000 mg | ORAL_TABLET | Freq: Every day | ORAL | 1 refills | Status: DC

## 2021-02-06 NOTE — Addendum Note (Signed)
Addended by: Cinda Quest on: 02/06/2021 02:26 PM   Modules accepted: Orders

## 2021-02-12 ENCOUNTER — Other Ambulatory Visit (INDEPENDENT_AMBULATORY_CARE_PROVIDER_SITE_OTHER): Payer: No Typology Code available for payment source

## 2021-02-12 ENCOUNTER — Other Ambulatory Visit: Payer: Self-pay

## 2021-02-12 DIAGNOSIS — E1065 Type 1 diabetes mellitus with hyperglycemia: Secondary | ICD-10-CM

## 2021-02-12 DIAGNOSIS — E038 Other specified hypothyroidism: Secondary | ICD-10-CM

## 2021-02-12 LAB — COMPREHENSIVE METABOLIC PANEL
ALT: 17 U/L (ref 0–35)
AST: 21 U/L (ref 0–37)
Albumin: 4.1 g/dL (ref 3.5–5.2)
Alkaline Phosphatase: 93 U/L (ref 39–117)
BUN: 10 mg/dL (ref 6–23)
CO2: 31 mEq/L (ref 19–32)
Calcium: 9.8 mg/dL (ref 8.4–10.5)
Chloride: 101 mEq/L (ref 96–112)
Creatinine, Ser: 0.76 mg/dL (ref 0.40–1.20)
GFR: 84.42 mL/min (ref >60.00)
Glucose, Bld: 215 mg/dL — ABNORMAL HIGH (ref 70–99)
Potassium: 4.3 mEq/L (ref 3.5–5.1)
Sodium: 138 mEq/L (ref 135–145)
Total Bilirubin: 0.5 mg/dL (ref 0.2–1.2)
Total Protein: 6.8 g/dL (ref 6.0–8.3)

## 2021-02-12 LAB — HEMOGLOBIN A1C: Hgb A1c MFr Bld: 9.5 % — ABNORMAL HIGH (ref 4.6–6.5)

## 2021-02-12 LAB — LIPID PANEL
Cholesterol: 237 mg/dL — ABNORMAL HIGH (ref 0–200)
HDL: 88.9 mg/dL (ref >39.00)
LDL Cholesterol: 137 mg/dL — ABNORMAL HIGH (ref 0–99)
NonHDL: 148.49
Total CHOL/HDL Ratio: 3
Triglycerides: 59 mg/dL (ref 0.0–149.0)
VLDL: 11.8 mg/dL (ref 0.0–40.0)

## 2021-02-12 LAB — MICROALBUMIN / CREATININE URINE RATIO
Creatinine,U: 49.5 mg/dL
Microalb Creat Ratio: 1.4 mg/g (ref 0.0–30.0)
Microalb, Ur: <0.7 mg/dL (ref 0.0–1.9)

## 2021-02-12 LAB — TSH: TSH: 6.08 u[IU]/mL — ABNORMAL HIGH (ref 0.35–5.50)

## 2021-02-14 ENCOUNTER — Encounter: Payer: Self-pay | Admitting: Endocrinology

## 2021-02-14 ENCOUNTER — Ambulatory Visit (INDEPENDENT_AMBULATORY_CARE_PROVIDER_SITE_OTHER): Payer: No Typology Code available for payment source | Admitting: Endocrinology

## 2021-02-14 ENCOUNTER — Other Ambulatory Visit: Payer: Self-pay

## 2021-02-14 VITALS — BP 126/84 | HR 66 | Ht 62.0 in | Wt 132.0 lb

## 2021-02-14 DIAGNOSIS — E7800 Pure hypercholesterolemia, unspecified: Secondary | ICD-10-CM

## 2021-02-14 DIAGNOSIS — E1065 Type 1 diabetes mellitus with hyperglycemia: Secondary | ICD-10-CM | POA: Diagnosis not present

## 2021-02-14 DIAGNOSIS — E063 Autoimmune thyroiditis: Secondary | ICD-10-CM | POA: Diagnosis not present

## 2021-02-14 MED ORDER — LEVOTHYROXINE SODIUM 25 MCG PO TABS: 25.0000 ug | ORAL_TABLET | Freq: Every day | ORAL | 3 refills | Status: DC

## 2021-02-14 NOTE — Progress Notes (Signed)
Patient ID: Felicia Gardner, female   DOB: 1961/01/11, 60 y.o.   MRN: [id number]           Reason for Appointment : Follow-up for Type 1 Diabetes    History of Present Illness          Diagnosis: Type 1 diabetes mellitus, date of diagnosis: 09/03/15        Diabetes  history:    She presented to the emergency room with weight loss, increased thirst and urination and had marked hyperglycemia  Labs in the emergency room showed >80 ketones in the urine and CO2 down to 15 On her initial consultation she was started on mealtime insulin with NovoLog and Lantus dose was increased to 12 units  RECENT history:    INSULIN regimen is: [employer]tr[employer] pump since 12/09/16, now using 770 pump  Basal rate midnight = 0.45, 6 AM = 0.45, 8 AM = 0.35,  : Bolus settings carbohydrate coverage 1: 7 at breakfast and lunch and 1:6  at supper  Correction factor I: 55 and target 120, active insulin time 3.45 hours  Her A1C is unusually high at 9.5 compared to 8.2 Also previously 8.3 from [facility]   Fructosamine when A1c was 8.3 was 330:  She has had the auto mode 98 % of the time with the average insulin dose 26 units  She still has lower blood sugars on her sensor compared to A1c with GMI only 6.9 recently   Current blood sugar patterns from CGM analysis: She has fairly good blood sugars most of the time Premeal blood has sporadic significantly high postprandial readings either after lunch and occasionally after supper  She has an average of 2 hyperglycemic episodes per day including some late morning  Blood sugars are slowly rising in the morning hours after about 6 AM and highest late at night overall after 10 PM  Hypoglycemia has occurred only sporadically but usually mid afternoon or around 6 PM  She also tends to have some low normal readings at that time  Postprandial reading may be occasionally significantly high with rapid increase in blood sugar postprandially  Current management and problems  identified:  She felt that her blood sugars were better controlled with Lyumjev but this apparently caused itching and she tried to use the whole sample insulin bottle before stopping  Although she thinks she is bolusing before starting to eat up to 10 minutes some of her blood sugars seem to rise rapidly after eating with relatively late boluses As before she has some tendency to low normal or low sugars late afternoon from being more active  She says she is not able to predict when she is going to be active and does not use temporary target  Occasionally will have a high reading after eating a large breakfast on _0    %  % Time Above 180 21+2  % Time above 250   % Time Below 70 1     PRE-MEAL Fasting Lunch Dinner Bedtime Overall  Average  152 141            2-hour POST-MEAL PC Breakfast PC Lunch PC Dinner  Glucose range:     Averages:  192 152   Previously:   CGM use % of time  99  2-week average/GV  148+/-47  Time in range  75   %  % Time Above 180  21  % Time above 250  2  % Time Below 70      PRE-MEAL Fasting Lunch Dinner Bedtime Overall  Glucose range:       Averages:  147  160  151     POST-MEAL PC Breakfast PC Lunch PC Dinner  Glucose range:     Averages:  148  180  167     Self-care: The diet that the patient has been following is: generally low fat and moderate in carbohydrates   She is eating Breakfast at 8 am usually peanut butter crackers with coffee; dinner at 6-6:30 pm                      Generally active on weekends around the house  CDE consultation: 3/17 and 7/18  Wt Readings from Last 3 Encounters:  02/14/21 132 lb (59.9 kg)  10/11/20 132 lb (59.9 kg)  07/12/20 132 lb 6.4 oz (60.1 kg)   Diabetes labs:  Lab Results  Component Value Date   HGBA1C 9.5 (H) 02/12/2021   HGBA1C 8.2 (H) 10/08/2020   HGBA1C 8.8 (H) 07/06/2020   Lab  Results  Component Value Date   MICROALBUR <0.7 02/12/2021   LDLCALC 137 (H) 02/12/2021   CREATININE 0.76 02/12/2021    Lab Results  Component Value Date   MICRALBCREAT 1.4 02/12/2021   MICRALBCREAT 1.1 03/01/2020   Lab Results  Component Value Date   FRUCTOSAMINE 334 (H) 07/14/2019      Allergies as of 02/14/2021       Reactions   Lyumjev [insulin Lispro] Itching        Medication List        Accurate as of February 14, 2021  4:09 PM. If you have any questions, ask your nurse or doctor.          STOP taking these medications    Lyumjev 100 UNIT/ML Soln Generic drug: Insulin Lispro-aabc Stopped by: Elayne Snare, MD       TAKE these medications    Accu-Chek Guide Me w/Device Kit 1 each by Does not apply route in the morning and at bedtime. Use as instructed to check blood sugar twice daily. DX:E10.65. Replaces Accu Chek link meter.   Accu-Chek Guide test strip Generic drug: glucose blood Use as instructed to test blood sugar 2 times daily E10.65   accu-chek soft touch lancets Use as instructed to test blood sugar 2 times daily E10.65   Alpha Lipoic Acid 200 MG Caps Take by mouth daily.   Chromium Picolinate 1000 MCG Tabs Take by mouth.   Cinnamon 500 MG capsule Take 500 mg by mouth daily.   HumaLOG 100 UNIT/ML injection Generic drug: insulin lispro USE UP TO 50 UNITS DAILY PER INSULIN PUMP.   Insulin Pen Needle 32G X 5 MM Misc Use 4 per day to inject Insulin   LUTEIN 20 PO Take by mouth.   multivitamin with minerals Tabs tablet Take 1 tablet by mouth daily.   rosuvastatin 5 MG tablet Commonly known as: CRESTOR Take 1 tablet (5 mg total) by mouth daily.   vitamin B-12 1000 MCG tablet Commonly known as: CYANOCOBALAMIN Take 1,000 mcg by mouth daily.   VITAMIN D-3 PO Take by mouth daily.        Allergies:  Allergies  Allergen Reactions   Lyumjev [Insulin Lispro] Itching    Past Medical History:  Diagnosis Date   Diabetes  mellitus without complication ([place]) 08/7340   diagnosed    Past Surgical History:  Procedure Laterality Date   BUNIONECTOMY  2011   right foot   TUBAL LIGATION  1990    Family History  Problem Relation Age of Onset   Heart disease Mother    Berenice Primas' disease Mother    Thyroid disease Sister    Thyroid disease Maternal Grandmother    Diabetes Neg Hx    Hypertension Father    Crohn's disease Father    Rheum arthritis Father    Thyroid disease Sister     Social History:  reports that she has been smoking cigarettes. She has a 41.00 pack-year smoking history. She has never used smokeless tobacco. She reports that she does not drink alcohol and does not use drugs.    Review of Systems         Lipids: She had good control with Crestor 5 mg daily  LDL last under 100 but now it is higher since she had difficulty getting her prescription approved through insurance  Lab Results  Component Value Date   CHOL 237 (H) 02/12/2021   CHOL 188 03/01/2020   CHOL 186 03/08/2019   Lab Results  Component Value Date   HDL 88.90 02/12/2021   HDL 77.90 03/01/2020   HDL 76.50 03/08/2019   Lab Results  Component Value Date   LDLCALC 137 (H) 02/12/2021   LDLCALC 96 03/01/2020   LDLCALC 96 03/08/2019   Lab Results  Component Value Date   TRIG 59.0 02/12/2021   TRIG 69.0 03/01/2020   TRIG 70.0 03/08/2019   Lab Results  Component Value Date   CHOLHDL 3 02/12/2021   CHOLHDL 2 03/01/2020   CHOLHDL 2 03/08/2019   Lab Results  Component Value Date   LDLDIRECT 134.0 11/13/2017   Lab Results  Component Value Date   ALT 17 02/12/2021         Thyroid:   In the past she had TSH done because of fatigue and weight loss which occurred for other reasons Had a transient increase in TSH in 3/17, back to normal subsequently TSH has been upper normal previously  She thinks he is getting more fatigued although some of this may be from long working hours TSH is higher than usual at  6.1  She has a family history of thyroid disease   Lab Results  Component Value Date   TSH 6.08 (H) 02/12/2021   TSH 4.19 03/01/2020   TSH 4.07 03/08/2019   FREET4 0.56 (L) 05/24/2018   FREET4 0.59 (L) 08/13/2017   FREET4 0.58 (L) 03/13/2016     Physical Examination:  BP 126/84   Pulse 66   Ht _0  (1.575 m)   Wt 132 lb (59.9 kg)   SpO2 98%   BMI 24.14 kg/m           ASSESSMENT:  Diabetes type 1, date of diagnosis 2017  See history of present illness for detailed discussion of current diabetes management, blood sugar patterns and problems identified  Her blood sugar patterns and daily insulin pump management was reviewed in detail from her insulin pump download analysis as above  Her A1c is again higher than expected at 9.5, recent GMI 6.9  Unclear why her A1c is higher than expected  Previously from [facility] her A1c was also over 8%  Most likely she is tending to have frequent high postprandial readings especially at lunch  She reportedly had better control with Lyumjev but this  was causing itching  Again she may not be timing her bolus ahead of time enough to control postprandial readings  However she has tendency to low sugars before dinnertime periodically as discussed above from increased physical activity   Active insulin time appears to be appropriate from the 3-hour readings  Microalbumin normal  LDL is higher from her running out of Crestor before the lab  Mild hypothyroidism: She has had some fatigue and may benefit from a trial of low-dose supplementation   PLAN:   She will try to get [employer] approved through her insurance  Carbohydrate coverage 1: 6 at lunchtime Active insulin time 4 hours  Reminded her to bolus about 15 minutes before eating as much as possible  If she is having blood sugars trending down when she is leaving work she will use a temporary target to avoid hypoglycemia  Also use temporary target if she is getting more physical  activity in the afternoon  Additional coverage for any higher fat meals   Trial of LEVOTHYROXINE 25 mcg daily  She will try to take Crestor consistently, discussed under LDL of 100 as a target  Follow-up in 3 months  There are no Patient Instructions on file for this visit.      Elayne Snare 02/14/2021, 4:09 PM   Note: This note was prepared with Dragon voice recognition system technology. Any transcriptional errors that result from this process are unintentional.

## 2021-02-14 NOTE — Patient Instructions (Signed)
Bolus 15 min before meals  Temp target if sugar heading down fast

## 2021-02-15 MED ORDER — FIASP 100 UNIT/ML IJ SOLN: INTRAMUSCULAR | 1 refills | Status: DC

## 2021-02-15 NOTE — Addendum Note (Signed)
Addended by: Elayne Snare on: 02/15/2021 08:14 AM   Modules accepted: Orders

## 2021-03-05 ENCOUNTER — Telehealth: Payer: Self-pay

## 2021-03-05 NOTE — Telephone Encounter (Signed)
na

## 2021-05-13 ENCOUNTER — Other Ambulatory Visit: Payer: Self-pay

## 2021-05-13 ENCOUNTER — Other Ambulatory Visit (INDEPENDENT_AMBULATORY_CARE_PROVIDER_SITE_OTHER): Payer: No Typology Code available for payment source

## 2021-05-13 DIAGNOSIS — E063 Autoimmune thyroiditis: Secondary | ICD-10-CM

## 2021-05-13 DIAGNOSIS — E1065 Type 1 diabetes mellitus with hyperglycemia: Secondary | ICD-10-CM | POA: Diagnosis not present

## 2021-05-13 DIAGNOSIS — E7800 Pure hypercholesterolemia, unspecified: Secondary | ICD-10-CM | POA: Diagnosis not present

## 2021-05-13 LAB — COMPREHENSIVE METABOLIC PANEL
ALT: 20 U/L (ref 0–35)
AST: 24 U/L (ref 0–37)
Albumin: 4.5 g/dL (ref 3.5–5.2)
Alkaline Phosphatase: 95 U/L (ref 39–117)
BUN: 10 mg/dL (ref 6–23)
CO2: 31 mEq/L (ref 19–32)
Calcium: 9.9 mg/dL (ref 8.4–10.5)
Chloride: 102 mEq/L (ref 96–112)
Creatinine, Ser: 0.8 mg/dL (ref 0.40–1.20)
GFR: 79.24 mL/min (ref >60.00)
Glucose, Bld: 171 mg/dL — ABNORMAL HIGH (ref 70–99)
Potassium: 4.5 mEq/L (ref 3.5–5.1)
Sodium: 139 mEq/L (ref 135–145)
Total Bilirubin: 0.5 mg/dL (ref 0.2–1.2)
Total Protein: 7.2 g/dL (ref 6.0–8.3)

## 2021-05-13 LAB — LIPID PANEL
Cholesterol: 210 mg/dL — ABNORMAL HIGH (ref 0–200)
HDL: 94.6 mg/dL (ref >39.00)
LDL Cholesterol: 103 mg/dL — ABNORMAL HIGH (ref 0–99)
NonHDL: 115.63
Total CHOL/HDL Ratio: 2
Triglycerides: 65 mg/dL (ref 0.0–149.0)
VLDL: 13 mg/dL (ref 0.0–40.0)

## 2021-05-13 LAB — HEMOGLOBIN A1C: Hgb A1c MFr Bld: 9.2 % — ABNORMAL HIGH (ref 4.6–6.5)

## 2021-05-13 LAB — T4, FREE: Free T4: 0.73 ng/dL (ref 0.60–1.60)

## 2021-05-13 LAB — TSH: TSH: 3.38 u[IU]/mL (ref 0.35–5.50)

## 2021-05-17 ENCOUNTER — Other Ambulatory Visit: Payer: No Typology Code available for payment source

## 2021-05-21 ENCOUNTER — Ambulatory Visit (INDEPENDENT_AMBULATORY_CARE_PROVIDER_SITE_OTHER): Payer: No Typology Code available for payment source | Admitting: Endocrinology

## 2021-05-21 ENCOUNTER — Encounter: Payer: Self-pay | Admitting: Endocrinology

## 2021-05-21 ENCOUNTER — Other Ambulatory Visit: Payer: Self-pay

## 2021-05-21 VITALS — BP 112/70 | HR 72 | Ht 62.0 in | Wt 133.0 lb

## 2021-05-21 DIAGNOSIS — E1065 Type 1 diabetes mellitus with hyperglycemia: Secondary | ICD-10-CM | POA: Diagnosis not present

## 2021-05-21 DIAGNOSIS — E063 Autoimmune thyroiditis: Secondary | ICD-10-CM

## 2021-05-21 DIAGNOSIS — E7800 Pure hypercholesterolemia, unspecified: Secondary | ICD-10-CM

## 2021-05-21 MED ORDER — ROSUVASTATIN CALCIUM 10 MG PO TABS: 10.0000 mg | ORAL_TABLET | Freq: Every day | ORAL | 3 refills | Status: DC

## 2021-05-21 NOTE — Progress Notes (Signed)
Patient ID: Felicia Gardner, female   DOB: [birth year], 60 y.o.   MRN: [id number]           Reason for Appointment : Follow-up for Type 1 Diabetes    History of Present Illness          Diagnosis: Type 1 diabetes mellitus, date of diagnosis: 09/03/15        Diabetes  history:    She presented to the emergency room with weight loss, increased thirst and urination and had marked hyperglycemia  Labs in the emergency room showed >80 ketones in the urine and CO2 down to 15 On her initial consultation she was started on mealtime insulin with NovoLog and Lantus dose was increased to 12 units  RECENT history:    INSULIN regimen is: Medtronic pump since 12/09/16, now using 770 pump  Basal rate midnight = 0.45, 6 AM = 0.45, 8 AM = 0.35,  : Bolus settings carbohydrate coverage 1: 6 at breakfast, 1: 7 at lunch and 1:6  at supper  Correction factor I: 55 and target 120, active insulin time 3.45 hours  Her A1C is still high at 9.2 compared to 9.5 previously 8.3 from [facility]   Fructosamine when A1c was 8.3 was 330:  She has had the auto mode 99 % of the time with the average insulin dose 26 units  She still has lower blood sugars on her sensor compared to A1c with GMI 7.1 for the last 2 weeks   Current blood sugar patterns from CGM analysis: She has a tendency to high sugars periodically either midday or evening and generally tends to have higher readings later at night 2 or more hours after dinner with highest readings around 10-11 PM on an average  Overnight blood sugars are relatively good and stable although she has a generalized trend of higher readings between 5 AM and 10 AM  POSTPRANDIAL readings on an average very consistently even but may sometimes have higher readings after dinner  Blood sugars about 3 hours after the meals are usually level with the Premeal reading  HYPOGLYCEMIA has been minimal with only rare low normal sugar at 1 PM  Premeal readings are about 160 at lunch and  dinner on an average  Current management and problems identified:  She has had only sporadic episodes of hyperglycemia but generally postprandial readings are controlled  Not clear if sometimes she may be a little late in bolusing before the meal  Occasionally on weekends has had somewhat higher readings including last week and when she had gastroenteritis  She does not appear to be taking enough correction boluses when blood sugars are higher persistently  Although she has a tendency to high readings after dinner this is not consistent and still fairly good on an average  She is not exercising and only has some activity with walking at work    Blood sugar data from CGM:  CGM use % of time   2-week average/GV 159/34  Time in range      73%  % Time Above 180 19+8  % Time above 250   % Time Below 70 0     PRE-MEAL Fasting Lunch Dinner Bedtime Overall  Glucose range:       Averages: 147 161 165     POST-MEAL PC Breakfast PC Lunch PC Dinner  Glucose range:     Averages: 140 155 183   Previously:  CGM use % of time 93  2-week average/GV 150/42  Time in  range     76   %  % Time Above 180 21+2  % Time above 250   % Time Below 70 1     PRE-MEAL Fasting Lunch Dinner Bedtime Overall  Average  152 141            2-hour POST-MEAL PC Breakfast PC Lunch PC Dinner  Glucose range:     Averages:  192 152      Self-care: The diet that the patient has been following is: generally low fat and moderate in carbohydrates   She is eating Breakfast at 8 am usually peanut butter crackers with coffee; dinner at 6-6:30 pm                      Generally active on weekends around the house  CDE consultation: 3/17 and 7/18  Wt Readings from Last 3 Encounters:  05/21/21 133 lb (60.3 kg)  02/14/21 132 lb (59.9 kg)  10/11/20 132 lb (59.9 kg)   Diabetes labs:  Lab Results  Component Value Date   HGBA1C 9.2 (H) 05/13/2021   HGBA1C 9.5 (H) 02/12/2021   HGBA1C 8.2 (H) 10/08/2020    Lab Results  Component Value Date   MICROALBUR <0.7 02/12/2021   [place] 103 (H) 05/13/2021   CREATININE 0.80 05/13/2021    Lab Results  Component Value Date   MICRALBCREAT 1.4 02/12/2021   MICRALBCREAT 1.1 03/01/2020   Lab Results  Component Value Date   FRUCTOSAMINE 334 (H) 07/14/2019      Allergies as of 05/21/2021       Reactions   Lyumjev [insulin Lispro] Itching        Medication List        Accurate as of May 21, 2021 10:39 AM. If you have any questions, ask your nurse or doctor.          Accu-Chek Guide Me w/Device Kit 1 each by Does not apply route in the morning and at bedtime. Use as instructed to check blood sugar twice daily. DX:E10.65. Replaces Accu Chek link meter.   Accu-Chek Guide test strip Generic drug: glucose blood Use as instructed to test blood sugar 2 times daily E10.65   accu-chek soft touch lancets Use as instructed to test blood sugar 2 times daily E10.65   Alpha Lipoic Acid 200 MG Caps Take by mouth daily.   Chromium Picolinate 1000 MCG Tabs Take by mouth.   Cinnamon 500 MG capsule Take 500 mg by mouth daily.   Fiasp 100 UNIT/ML Soln Generic drug: Insulin Aspart (w/Niacinamide) Use up to 50 units daily in insulin pump   HumaLOG 100 UNIT/ML injection Generic drug: insulin lispro SMARTSIG:0-50 Unit(s) SUB-Q Daily   Insulin Pen Needle 32G X 5 MM Misc Use 4 per day to inject Insulin   levothyroxine 25 MCG tablet Commonly known as: Synthroid Take 1 tablet (25 mcg total) by mouth daily before breakfast.   LUTEIN 20 PO Take by mouth.   multivitamin with minerals Tabs tablet Take 1 tablet by mouth daily.   rosuvastatin 5 MG tablet Commonly known as: CRESTOR Take 1 tablet (5 mg total) by mouth daily.   vitamin B-12 1000 MCG tablet Commonly known as: CYANOCOBALAMIN Take 1,000 mcg by mouth daily.   VITAMIN D-3 PO Take by mouth daily.        Allergies:  Allergies  Allergen Reactions   Lyumjev  [Insulin Lispro] Itching    Past Medical History:  Diagnosis Date   Diabetes mellitus  without complication ([place]) 09/7423   diagnosed    Past Surgical History:  Procedure Laterality Date   BUNIONECTOMY  2011   right foot   TUBAL LIGATION  1990    Family History  Problem Relation Age of Onset   Heart disease Mother    Berenice Primas' disease Mother    Thyroid disease Sister    Thyroid disease Maternal Grandmother    Diabetes Neg Hx    Hypertension Father    Crohn's disease Father    Rheum arthritis Father    Thyroid disease Sister     Social History:  reports that she has been smoking cigarettes. She has a 41.00 pack-year smoking history. She has never used smokeless tobacco. She reports that she does not drink alcohol and does not use drugs.    Review of Systems         Lipids: She had good control with Crestor 5 mg daily but now her LDL is still over 100 even with taking her medication regularly No change in diet recently  Lab Results  Component Value Date   CHOL 210 (H) 05/13/2021   CHOL 237 (H) 02/12/2021   CHOL 188 03/01/2020   Lab Results  Component Value Date   HDL 94.60 05/13/2021   HDL 88.90 02/12/2021   HDL 77.90 03/01/2020   Lab Results  Component Value Date   LDLCALC 103 (H) 05/13/2021   LDLCALC 137 (H) 02/12/2021   LDLCALC 96 03/01/2020   Lab Results  Component Value Date   TRIG 65.0 05/13/2021   TRIG 59.0 02/12/2021   TRIG 69.0 03/01/2020   Lab Results  Component Value Date   CHOLHDL 2 05/13/2021   CHOLHDL 3 02/12/2021   CHOLHDL 2 03/01/2020   Lab Results  Component Value Date   LDLDIRECT 134.0 11/13/2017   Lab Results  Component Value Date   ALT 20 05/13/2021      HYPOTHYROIDISM:  TSH has been upper normal previously but was 6.1 as of 8/22 She was complaining of fatigue and is now on a trial of 25 mcg levothyroxine With this she thinks she has improved energy levels Taking her levothyroxine regularly in the mornings  TSH back  to normal  She has a family history of thyroid disease   Lab Results  Component Value Date   TSH 3.38 05/13/2021   TSH 6.08 (H) 02/12/2021   TSH 4.19 03/01/2020   FREET4 0.73 05/13/2021   FREET4 0.56 (L) 05/24/2018   FREET4 0.59 (L) 08/13/2017     Physical Examination:  BP 112/70   Pulse 72   Ht 5' 2"  (1.575 m)   Wt 133 lb (60.3 kg)   SpO2 97%   BMI 24.33 kg/m           ASSESSMENT:  Diabetes type 1, date of diagnosis 2017  See history of present illness for detailed discussion of current diabetes management, blood sugar patterns and problems identified  Her blood sugar patterns and daily insulin pump management was reviewed in detail from her insulin pump download analysis as above  Her A1c is again higher than expected at 9.2, recent GMI 7.1  Unclear why her A1c is higher than expected   No recent periods of consistent hypoglycemia in her time in target is still 73% No consistently high postprandial readings also She reportedly had better control with Lyumjev but this was causing itching Generally less hypoglycemia also  Active insulin time to be continued at 4 hours, adequate results from the 3-hour  readings  Microalbumin normal  HYPERLIPIDEMIA: LDL is still over 100 and this is despite taking Crestor every morning regularly She will need to have LDL below 100  Mild hypothyroidism: She has had some fatigue and is doing better subjectively with 25 mcg levothyroxine with normalization of TSH     PLAN:   No change in pump settings but may consider changing carbohydrate coverage at suppertime to 1: 6 also  Also will benefit from trying to bolus about 15 minutes before eating She also needs to make sure she takes correction boluses anytime if she has persistently high readings  She can use temporary target if she is getting more physical activity in the afternoon  Additional coverage at dinnertime to be taken for any higher fat meals  We will check her  A1c from [facility] next time   Continue LEVOTHYROXINE 25 mcg daily  Crestor 10 mg daily  Follow-up in 3 months  There are no Patient Instructions on file for this visit.      Elayne Snare 05/21/2021, 10:39 AM   Note: This note was prepared with Dragon voice recognition system technology. Any transcriptional errors that result from this process are unintentional.

## 2021-05-21 NOTE — Patient Instructions (Signed)
10mg  Crestor

## 2021-06-14 ENCOUNTER — Other Ambulatory Visit: Payer: Self-pay | Admitting: Endocrinology

## 2021-06-25 ENCOUNTER — Telehealth: Payer: Self-pay | Admitting: Endocrinology

## 2021-06-25 MED ORDER — LEVOTHYROXINE SODIUM 25 MCG PO TABS: 25.0000 ug | ORAL_TABLET | Freq: Every day | ORAL | 3 refills | Status: DC

## 2021-06-25 NOTE — Telephone Encounter (Signed)
Rx sent to [facility]

## 2021-06-25 NOTE — Telephone Encounter (Signed)
Patient called to advise that Levothyroxine was supposed to be sent to the [facility] in Pharmacy so that her medication is free.  However as of today she is out of medication and is going to pick up the 30 day supply that was sent to [facility], but needs the 90 day refill for Levothyroxine to be sent to [facility] in Pharmacy.  Any questions or need for clarification please call [phone]

## 2021-07-11 ENCOUNTER — Telehealth: Payer: Self-pay | Admitting: Endocrinology

## 2021-07-11 NOTE — Telephone Encounter (Signed)
Paperwork from [facility] has be given to Dr to complete and sign

## 2021-07-11 NOTE — Telephone Encounter (Signed)
Patient called to request New RX's for the following:  Extended Infusion Set  and  Extended Reservoir Be sent to [employer]

## 2021-07-19 NOTE — Telephone Encounter (Signed)
Patient called inquiring on the documentation to be sent to [employer]. Please provide confirmation to patient at [phone]

## 2021-07-23 NOTE — Telephone Encounter (Signed)
Paperwork was signed and faxed on 07/15/21 and confirmed received. I will refax today.

## 2021-08-15 ENCOUNTER — Other Ambulatory Visit: Payer: Self-pay

## 2021-08-15 ENCOUNTER — Other Ambulatory Visit (INDEPENDENT_AMBULATORY_CARE_PROVIDER_SITE_OTHER): Payer: No Typology Code available for payment source

## 2021-08-15 DIAGNOSIS — E1065 Type 1 diabetes mellitus with hyperglycemia: Secondary | ICD-10-CM | POA: Diagnosis not present

## 2021-08-15 DIAGNOSIS — E063 Autoimmune thyroiditis: Secondary | ICD-10-CM | POA: Diagnosis not present

## 2021-08-15 DIAGNOSIS — E7800 Pure hypercholesterolemia, unspecified: Secondary | ICD-10-CM

## 2021-08-15 LAB — COMPREHENSIVE METABOLIC PANEL
ALT: 22 U/L (ref 0–35)
AST: 22 U/L (ref 0–37)
Albumin: 4.3 g/dL (ref 3.5–5.2)
Alkaline Phosphatase: 88 U/L (ref 39–117)
BUN: 11 mg/dL (ref 6–23)
CO2: 33 mEq/L — ABNORMAL HIGH (ref 19–32)
Calcium: 9.8 mg/dL (ref 8.4–10.5)
Chloride: 100 mEq/L (ref 96–112)
Creatinine, Ser: 0.74 mg/dL (ref 0.40–1.20)
GFR: 86.85 mL/min (ref >60.00)
Glucose, Bld: 219 mg/dL — ABNORMAL HIGH (ref 70–99)
Potassium: 5.3 mEq/L — ABNORMAL HIGH (ref 3.5–5.1)
Sodium: 138 mEq/L (ref 135–145)
Total Bilirubin: 0.5 mg/dL (ref 0.2–1.2)
Total Protein: 6.1 g/dL (ref 6.0–8.3)

## 2021-08-15 LAB — TSH: TSH: 3.34 u[IU]/mL (ref 0.35–5.50)

## 2021-08-15 LAB — LIPID PANEL
Cholesterol: 204 mg/dL — ABNORMAL HIGH (ref 0–200)
HDL: 81.8 mg/dL (ref >39.00)
LDL Cholesterol: 108 mg/dL — ABNORMAL HIGH (ref 0–99)
NonHDL: 122.52
Total CHOL/HDL Ratio: 2
Triglycerides: 71 mg/dL (ref 0.0–149.0)
VLDL: 14.2 mg/dL (ref 0.0–40.0)

## 2021-08-15 LAB — HEMOGLOBIN A1C: Hgb A1c MFr Bld: 8.8 % — ABNORMAL HIGH (ref 4.6–6.5)

## 2021-08-15 LAB — T4, FREE: Free T4: 0.75 ng/dL (ref 0.60–1.60)

## 2021-08-20 ENCOUNTER — Ambulatory Visit: Payer: No Typology Code available for payment source | Admitting: Endocrinology

## 2021-08-29 ENCOUNTER — Encounter: Payer: Self-pay | Admitting: Endocrinology

## 2021-08-29 ENCOUNTER — Other Ambulatory Visit: Payer: Self-pay

## 2021-08-29 ENCOUNTER — Ambulatory Visit (INDEPENDENT_AMBULATORY_CARE_PROVIDER_SITE_OTHER): Payer: No Typology Code available for payment source | Admitting: Endocrinology

## 2021-08-29 VITALS — BP 122/70 | HR 70 | Ht 62.0 in | Wt 130.8 lb

## 2021-08-29 DIAGNOSIS — E7800 Pure hypercholesterolemia, unspecified: Secondary | ICD-10-CM

## 2021-08-29 DIAGNOSIS — E063 Autoimmune thyroiditis: Secondary | ICD-10-CM

## 2021-08-29 DIAGNOSIS — E1065 Type 1 diabetes mellitus with hyperglycemia: Secondary | ICD-10-CM | POA: Diagnosis not present

## 2021-08-29 NOTE — Progress Notes (Signed)
Patient ID: Felicia Gardner, female   DOB: Feb 25, 1961, 61 y.o.   MRN: [id number]           Reason for Appointment : Follow-up for Type 1 Diabetes    History of Present Illness          Diagnosis: Type 1 diabetes mellitus, date of diagnosis: 09/03/15        Diabetes  history:    She presented to the emergency room with weight loss, increased thirst and urination and had marked hyperglycemia  Labs in the emergency room showed >80 ketones in the urine and CO2 down to 15 On her initial consultation she was started on mealtime insulin with NovoLog and Lantus dose was increased to 12 units  RECENT history:    INSULIN regimen is: [employer] pump since 12/09/16, now using 770 pump  Basal rate midnight = 0.45, 6 AM = 0.45, 8 AM = 0.35,  : Bolus settings carbohydrate coverage 1: 6 at breakfast, 1: 7 at lunch and 1:6  at supper  Correction factor I: 55 and target 120, active insulin time 3.45 hours  Her A1C is slightly better at 8.8 compared to 9.2  This was previously 8.3 from [facility]   Fructosamine when A1c was 8.3 was 330:  She has had the auto mode 99 % of the time with the average insulin dose 30 units  She usually has lower blood sugars on her sensor compared to A1c with GMI 7% for the last 2 weeks   Current blood sugar patterns from CGM analysis: Highest blood sugars are usually current average after lunch or late in the evening after 10 PM Overnight blood sugars are relatively good and stable although she has a generalized trend of higher readings between 5 AM and 10 AM  POSTPRANDIAL readings are very stable at least after about 2 AM with blood sugars near target, she does have progressive rise in blood sugars between 6 AM and 10 AM although less variable overall overnight compared to her last visit and more dawn phenomenon recently   Blood sugars are more even compared to Premeal readings after dinner with less variability  Usually not eating breakfast and no boluses in the  morning usually  HYPOGLYCEMIA has been minimal with a low sugar once at 2 AM and occasionally low normal readings in 3-4 PM   Current management and problems identified:  She has had more variability in blood sugars around mealtimes  This is likely to be from sometimes not accurately estimating her bolus amounts She tends to have higher fat meals periodically and this will cause her blood sugar to be significantly higher especially with foods like [place] foods or any other high fat meals  She does not bolus for her coffee and blood sugar usually rises in the mornings with her 2 cups of coffee  Rarely may base a bolus at mealtime but generally appears to be trying to bolus before starting to eat  Also occasionally may have continued high blood sugars late in the evenings after dinner and may not always take a correction bolus for this She is not exercising and only has some activity with walking at work Currently using HUMALOG  CGM use % of time 95  2-week average/GV 153+/-50  Time in range    75    %  % Time Above 180 20+5  % Time above 250   % Time Below 70 0     PRE-MEAL Fasting Lunch Dinner Bedtime Overall  Glucose  range: 90-150      Averages: 129 155 157     POST-MEAL PC Breakfast PC Lunch PC Dinner  Glucose range:     Averages:  175 151     Blood sugar data from CGM:  CGM use % of time   2-week average/GV 159/34  Time in range      73%  % Time Above 180 19+8  % Time above 250   % Time Below 70 0     PRE-MEAL Fasting Lunch Dinner Bedtime Overall  Glucose range:       Averages: 147 161 165     POST-MEAL PC Breakfast PC Lunch PC Dinner  Glucose range:     Averages: 140 155 183     Self-care: The diet that the patient has been following is: generally low fat and moderate in carbohydrates   She is eating Breakfast at 8 am usually peanut butter crackers with coffee; dinner at 6-6:30 pm                      Generally active on weekends around the house  CDE  consultation: 3/17 and 7/18  Wt Readings from Last 3 Encounters:  08/29/21 130 lb 12.8 oz (59.3 kg)  05/21/21 133 lb (60.3 kg)  02/14/21 132 lb (59.9 kg)   Diabetes labs:  Lab Results  Component Value Date   HGBA1C 8.8 (H) 08/15/2021   HGBA1C 9.2 (H) 05/13/2021   HGBA1C 9.5 (H) 02/12/2021   Lab Results  Component Value Date   MICROALBUR <0.7 02/12/2021   LDLCALC 108 (H) 08/15/2021   CREATININE 0.74 08/15/2021    Lab Results  Component Value Date   MICRALBCREAT 1.4 02/12/2021   MICRALBCREAT 1.1 03/01/2020   Lab Results  Component Value Date   FRUCTOSAMINE 334 (H) 07/14/2019      Allergies as of 08/29/2021       Reactions   Lyumjev [insulin Lispro] Itching        Medication List        Accurate as of August 29, 2021  2:18 PM. If you have any questions, ask your nurse or doctor.          Accu-Chek Guide Me w/Device Kit 1 each by Does not apply route in the morning and at bedtime. Use as instructed to check blood sugar twice daily. DX:E10.65. Replaces Accu Chek link meter.   Accu-Chek Guide test strip Generic drug: glucose blood Use as instructed to test blood sugar 2 times daily E10.65   accu-chek soft touch lancets Use as instructed to test blood sugar 2 times daily E10.65   Alpha Lipoic Acid 200 MG Caps Take by mouth daily.   Chromium Picolinate 1000 MCG Tabs Take by mouth.   Cinnamon 500 MG capsule Take 500 mg by mouth daily.   HumaLOG 100 UNIT/ML injection Generic drug: insulin lispro SMARTSIG:0-50 Unit(s) SUB-Q Daily   Insulin Pen Needle 32G X 5 MM Misc Use 4 per day to inject Insulin   levothyroxine 25 MCG tablet Commonly known as: SYNTHROID Take 1 tablet (25 mcg total) by mouth daily before breakfast.   LUTEIN 20 PO Take by mouth.   multivitamin with minerals Tabs tablet Take 1 tablet by mouth daily.   rosuvastatin 10 MG tablet Commonly known as: Crestor Take 1 tablet (10 mg total) by mouth daily.   vitamin B-12 1000 MCG  tablet Commonly known as: CYANOCOBALAMIN Take 1,000 mcg by mouth daily.   VITAMIN D-3 PO  Take by mouth daily.        Allergies:  Allergies  Allergen Reactions   Lyumjev [Insulin Lispro] Itching    Past Medical History:  Diagnosis Date   Diabetes mellitus without complication ([place]) 07/6710   diagnosed    Past Surgical History:  Procedure Laterality Date   BUNIONECTOMY  2011   right foot   TUBAL LIGATION  1990    Family History  Problem Relation Age of Onset   Heart disease Gardner    Felicia Gardner    Thyroid disease Sister    Thyroid disease Maternal Grandmother    Diabetes Neg Hx    Hypertension Father    Crohn's disease Father    Rheum arthritis Father    Thyroid disease Sister     Social History:  reports that she has been smoking cigarettes. She has a 41.00 pack-year smoking history. She has never used smokeless tobacco. She reports that she does not drink alcohol and does not use drugs.    Review of Systems        Lipids: She had good control with Crestor, now 10 mg daily but now her LDL is still over 100 even with taking her medication regularly   Lab Results  Component Value Date   CHOL 204 (H) 08/15/2021   CHOL 210 (H) 05/13/2021   CHOL 237 (H) 02/12/2021   Lab Results  Component Value Date   HDL 81.80 08/15/2021   HDL 94.60 05/13/2021   HDL 88.90 02/12/2021   Lab Results  Component Value Date   LDLCALC 108 (H) 08/15/2021   LDLCALC 103 (H) 05/13/2021   LDLCALC 137 (H) 02/12/2021   Lab Results  Component Value Date   TRIG 71.0 08/15/2021   TRIG 65.0 05/13/2021   TRIG 59.0 02/12/2021   Lab Results  Component Value Date   CHOLHDL 2 08/15/2021   CHOLHDL 2 05/13/2021   CHOLHDL 3 02/12/2021   Lab Results  Component Value Date   LDLDIRECT 134.0 11/13/2017   Lab Results  Component Value Date   ALT 22 08/15/2021      HYPOTHYROIDISM:  TSH has been upper normal previously but was 6.1 as of 8/22 She was complaining of  fatigue and is now on 25 mcg levothyroxine After starting this she has improved energy levels Taking her levothyroxine regularly in the mornings  TSH consistently back to normal  She has a family history of thyroid disease   Lab Results  Component Value Date   TSH 3.34 08/15/2021   TSH 3.38 05/13/2021   TSH 6.08 (H) 02/12/2021   FREET4 0.75 08/15/2021   FREET4 0.73 05/13/2021   FREET4 0.56 (L) 05/24/2018     Physical Examination:  BP 122/70    Pulse 70    Ht 5' 2"  (1.575 m)    Wt 130 lb 12.8 oz (59.3 kg)    SpO2 98%    BMI 23.92 kg/m           ASSESSMENT:  Diabetes type 1, date of diagnosis 2017  See history of present illness for detailed discussion of current diabetes management, blood sugar patterns and problems identified  Her blood sugar patterns and daily insulin pump management was reviewed in detail from her insulin pump download analysis as above  Her A1c is again 8.8 and higher than expected for her GMI of 7 on her sensor  Unclear why her A1c is higher than expected   She has postprandial hyperglycemia at times as discussed above  and explained to her how to help control these blood sugars better  Active insulin time to be continued at 4 hours  HYPERLIPIDEMIA: LDL is still over 100 and this is despite taking 10 mg Crestor now May not have been consistent in diet over the last 3 months  HYPOTHYROIDISM this is mild and well controlled with 25 mcg levothyroxine    PLAN:   Discussed that she needs to make sure she has 2 to 3 units more for any higher fat meals Also try to bolus ahead of time if possible for most meals Encouraged her to start exercise on weekends at least No change in carb ratio Take 1 unit bolus for each cup of coffee in the morning Also will check her A1c from [facility] next time  Continue LEVOTHYROXINE 25 mcg daily  Recheck lipids on next visit but if LDL is still consistently high will consider increasing the dose to 40  mg  Follow-up in 3 months  There are no Patient Instructions on file for this visit.      Elayne Snare 08/29/2021, 2:18 PM   Note: This note was prepared with Dragon voice recognition system technology. Any transcriptional errors that result from this process are unintentional.

## 2021-08-29 NOTE — Patient Instructions (Addendum)
Try 1 unit per cup of coffee  ? ?2 units extra for hi fat meals ?

## 2021-09-09 ENCOUNTER — Telehealth: Payer: Self-pay

## 2021-09-09 NOTE — Telephone Encounter (Signed)
Patient left voicemail for paperwork to be completed and sent to [employer] for pump supplies. Called patient and informed that paperwork has been compleed and faxed back ?

## 2021-10-18 ENCOUNTER — Encounter: Payer: Self-pay | Admitting: Endocrinology

## 2021-12-03 ENCOUNTER — Other Ambulatory Visit: Payer: Self-pay | Admitting: Endocrinology

## 2021-12-30 ENCOUNTER — Other Ambulatory Visit (INDEPENDENT_AMBULATORY_CARE_PROVIDER_SITE_OTHER): Payer: No Typology Code available for payment source

## 2021-12-30 DIAGNOSIS — E1065 Type 1 diabetes mellitus with hyperglycemia: Secondary | ICD-10-CM

## 2021-12-30 DIAGNOSIS — E063 Autoimmune thyroiditis: Secondary | ICD-10-CM

## 2021-12-30 DIAGNOSIS — E7800 Pure hypercholesterolemia, unspecified: Secondary | ICD-10-CM | POA: Diagnosis not present

## 2021-12-30 LAB — BASIC METABOLIC PANEL
BUN: 12 mg/dL (ref 6–23)
CO2: 30 mEq/L (ref 19–32)
Calcium: 9.8 mg/dL (ref 8.4–10.5)
Chloride: 105 mEq/L (ref 96–112)
Creatinine, Ser: 0.77 mg/dL (ref 0.40–1.20)
GFR: 82.59 mL/min (ref >60.00)
Glucose, Bld: 159 mg/dL — ABNORMAL HIGH (ref 70–99)
Potassium: 4.6 mEq/L (ref 3.5–5.1)
Sodium: 142 mEq/L (ref 135–145)

## 2021-12-30 LAB — TSH: TSH: 3.37 u[IU]/mL (ref 0.35–5.50)

## 2021-12-30 LAB — LDL CHOLESTEROL, DIRECT: Direct LDL: 95 mg/dL

## 2021-12-31 LAB — HEMOGLOBIN A1C
Est. average glucose Bld gHb Est-mCnc: 189 mg/dL
Hgb A1c MFr Bld: 8.2 % — ABNORMAL HIGH (ref 4.8–5.6)

## 2022-01-02 ENCOUNTER — Ambulatory Visit (INDEPENDENT_AMBULATORY_CARE_PROVIDER_SITE_OTHER): Payer: No Typology Code available for payment source | Admitting: Endocrinology

## 2022-01-02 ENCOUNTER — Encounter: Payer: Self-pay | Admitting: Endocrinology

## 2022-01-02 VITALS — BP 138/76 | HR 62 | Ht 62.0 in | Wt 133.4 lb

## 2022-01-02 DIAGNOSIS — E1065 Type 1 diabetes mellitus with hyperglycemia: Secondary | ICD-10-CM | POA: Diagnosis not present

## 2022-01-02 DIAGNOSIS — E063 Autoimmune thyroiditis: Secondary | ICD-10-CM | POA: Diagnosis not present

## 2022-01-02 NOTE — Progress Notes (Signed)
Patient ID: Felicia Gardner, female   DOB: [birth year], 61 y.o.   MRN: [id number]           Reason for Appointment : Follow-up for Type 1 Diabetes    History of Present Illness          Diagnosis: Type 1 diabetes mellitus, date of diagnosis: 09/03/15        Diabetes  history:    She presented to the emergency room with weight loss, increased thirst and urination and had marked hyperglycemia  Labs in the emergency room showed >80 ketones in the urine and CO2 down to 15 On her initial consultation she was started on mealtime insulin with NovoLog and Lantus dose was increased to 12 units  RECENT history:    INSULIN regimen is: Medtronic pump since 12/09/16, now using 770 pump  Basal rate midnight = 0.45, 6 AM = 0.45, 8 AM = 0.35,  : Bolus settings carbohydrate coverage 1: 6 at breakfast, 1: 7 at lunch and 1:6  at supper  Correction factor I: 55 and target 120, active insulin time 4 hours  Her A1C is slightly better at 8.2 from [facility]    Fructosamine when A1c was 8.3 was 330:  She has had the auto mode 99 % of the time with the average insulin dose 30 units  She usually has lower blood sugars on her sensor compared to A1c with GMI 7% for the last 2 weeks   Current blood sugar patterns from CGM analysis: Highest blood sugars are appearing to be after dinner and late evening  OVERNIGHT blood sugars are around 190 at midnight and then improving by 2 AM to stable levels and no hypoglycemia except once  Premeal readings ranging as follows and highest at dinnertime POSTPRANDIAL readings are not significantly higher compared to Premeal readings hypertension dinner, usually not eating breakfast Postprandial readings are highly variable after dinner current average after lunch or late in the evening after 10 PM Has only rare hypoglycemia either around 8 PM or 1 AM Generally blood sugars appear to be trending slightly higher at 3 hours compared to 2 hours but mostly after lunch   Current  management and problems identified:  She has had more time above range compared to the last visit  This is primarily from her blood sugars going up preoperatively after her meals especially dinner  As before she appears to be having occasionally late or inadequate boluses at dinnertime  Rarely may miss a bolus for lunch  She says that more recently she is trying to put in her carbohydrates more accurately and may have been backing off a little previously for fear of hypoglycemia  Only once had significant hypoglycemia late at night, she thinks that with putting in 35 g for her ice cream snack her blood sugar got low Currently using HUMALOG  CGM use % of time 93  2-week average/GV 160/34  Time in range        69%  % Time Above 180 24  % Time above 250 6  % Time Below 70 1     PRE-MEAL Fasting Lunch Dinner Bedtime Overall  Glucose range:       Averages: 148 146 170     POST-MEAL PC Breakfast PC Lunch PC Dinner  Glucose range:     Averages:  158 184   Previously:  CGM use % of time 95  2-week average/GV 153+/-50  Time in range    75    %  %  Time Above 180 20+5  % Time above 250   % Time Below 70 0     PRE-MEAL Fasting Lunch Dinner Bedtime Overall  Glucose range: 90-150      Averages: 129 155 157     POST-MEAL PC Breakfast PC Lunch PC Dinner  Glucose range:     Averages:  175 151     Self-care: The diet that the patient has been following is: generally low fat and moderate in carbohydrates   She is eating Breakfast at 8 am usually peanut butter crackers with coffee; dinner at 6-6:30 pm                      Generally active on weekends around the house  CDE consultation: 3/17 and 7/18  Wt Readings from Last 3 Encounters:  01/02/22 133 lb 6.4 oz (60.5 kg)  08/29/21 130 lb 12.8 oz (59.3 kg)  05/21/21 133 lb (60.3 kg)   Diabetes labs:  Lab Results  Component Value Date   HGBA1C 8.2 (H) 12/30/2021   HGBA1C 8.8 (H) 08/15/2021   HGBA1C 9.2 (H) 05/13/2021   Lab  Results  Component Value Date   MICROALBUR <0.7 02/12/2021   LDLCALC 108 (H) 08/15/2021   CREATININE 0.77 12/30/2021    Lab Results  Component Value Date   MICRALBCREAT 1.4 02/12/2021   MICRALBCREAT 1.1 03/01/2020   Lab Results  Component Value Date   FRUCTOSAMINE 334 (H) 07/14/2019      Allergies as of 01/02/2022       Reactions   Lyumjev [insulin Lispro] Itching        Medication List        Accurate as of January 02, 2022  9:39 AM. If you have any questions, ask your nurse or doctor.          Accu-Chek Guide Me w/Device Kit 1 each by Does not apply route in the morning and at bedtime. Use as instructed to check blood sugar twice daily. DX:E10.65. Replaces Accu Chek link meter.   Accu-Chek Guide test strip Generic drug: glucose blood Use as instructed to test blood sugar 2 times daily E10.65   accu-chek soft touch lancets Use as instructed to test blood sugar 2 times daily E10.65   Alpha Lipoic Acid 200 MG Caps Take by mouth daily.   Chromium Picolinate 1000 MCG Tabs Take by mouth.   Cinnamon 500 MG capsule Take 500 mg by mouth daily.   HumaLOG 100 UNIT/ML injection Generic drug: insulin lispro USE UP TO 50 UNITS DAILY PER INSULIN PUMP   Insulin Pen Needle 32G X 5 MM Misc Use 4 per day to inject Insulin   levothyroxine 25 MCG tablet Commonly known as: SYNTHROID Take 1 tablet (25 mcg total) by mouth daily before breakfast.   LUTEIN 20 PO Take by mouth.   multivitamin with minerals Tabs tablet Take 1 tablet by mouth daily.   rosuvastatin 10 MG tablet Commonly known as: Crestor Take 1 tablet (10 mg total) by mouth daily.   vitamin B-12 1000 MCG tablet Commonly known as: CYANOCOBALAMIN Take 1,000 mcg by mouth daily.   VITAMIN D-3 PO Take by mouth daily.        Allergies:  Allergies  Allergen Reactions   Lyumjev [Insulin Lispro] Itching    Past Medical History:  Diagnosis Date   Diabetes mellitus without complication ([place]) 08/4194    diagnosed    Past Surgical History:  Procedure Laterality Date   BUNIONECTOMY  2011  right foot   TUBAL LIGATION  1990    Family History  Problem Relation Age of Onset   Heart disease Mother    Berenice Primas' disease Mother    Thyroid disease Sister    Thyroid disease Maternal Grandmother    Diabetes Neg Hx    Hypertension Father    Crohn's disease Father    Rheum arthritis Father    Thyroid disease Sister     Social History:  reports that she has been smoking cigarettes. She has a 41.00 pack-year smoking history. She has never used smokeless tobacco. She reports that she does not drink alcohol and does not use drugs.    Review of Systems        Lipids: She had good control with Crestor, taking 10 mg daily  LDL is low below 100 again     Lab Results  Component Value Date   CHOL 204 (H) 08/15/2021   CHOL 210 (H) 05/13/2021   CHOL 237 (H) 02/12/2021   Lab Results  Component Value Date   HDL 81.80 08/15/2021   HDL 94.60 05/13/2021   HDL 88.90 02/12/2021   Lab Results  Component Value Date   LDLCALC 108 (H) 08/15/2021   LDLCALC 103 (H) 05/13/2021   LDLCALC 137 (H) 02/12/2021   Lab Results  Component Value Date   TRIG 71.0 08/15/2021   TRIG 65.0 05/13/2021   TRIG 59.0 02/12/2021   Lab Results  Component Value Date   CHOLHDL 2 08/15/2021   CHOLHDL 2 05/13/2021   CHOLHDL 3 02/12/2021   Lab Results  Component Value Date   LDLDIRECT 95.0 12/30/2021   LDLDIRECT 134.0 11/13/2017   Lab Results  Component Value Date   ALT 22 08/15/2021      HYPOTHYROIDISM:  TSH has been upper normal previously but was 6.1 as of 8/22  She was complaining of fatigue and is now on 25 mcg levothyroxine After starting this she has improved energy levels Taking her levothyroxine regularly before breakfast  TSH consistently back to normal  She has a family history of thyroid disease   Lab Results  Component Value Date   TSH 3.37 12/30/2021   TSH 3.34 08/15/2021    TSH 3.38 05/13/2021   FREET4 0.75 08/15/2021   FREET4 0.73 05/13/2021   FREET4 0.56 (L) 05/24/2018     Physical Examination:  BP 138/76   Pulse 62   Ht _0  (1.575 m)   Wt 133 lb 6.4 oz (60.5 kg)   SpO2 98%   BMI 24.40 kg/m       Thyroid not palpable  Diabetic Foot Exam - Simple   Simple Foot Form Diabetic Foot exam was performed with the following findings: Yes   Visual Inspection No deformities, no ulcerations, no other skin breakdown bilaterally: Yes Sensation Testing Intact to touch and monofilament testing bilaterally: Yes Pulse Check Posterior Tibialis and Dorsalis pulse intact bilaterally: Yes Comments        ASSESSMENT:  Diabetes type 1, date of diagnosis 2017  See history of present illness for detailed discussion of current diabetes management, blood sugar patterns and problems identified  Her blood sugar patterns and daily insulin pump management was reviewed in detail from her insulin pump download analysis as above  Her A1c is again relatively high, now 8.2 although GMI is 7.1  She has postprandial hyperglycemia after dinner and occasionally late afternoon May not be getting adequate basal effect because of active insulin time of 4 hours  HYPERLIPIDEMIA: LDL  is back down to below 100 and may be doing better with her diet  HYPOTHYROIDISM this is mild and consistently controlled with 25 mcg levothyroxine    PLAN:   Active insulin time to be reduced to 3 and half hours  Again reminded that she likely needs 2 to 3 units more for any higher fat meals but also try to make sure she is counting her actual carbohydrates and not reducing her bolus amount She does need to make sure she boluses ahead of time To also check fructosamine on the next visit to correlate with her for A1c  She will continue to pursue the 780 pump upgrade as this will likely help her postprandial readings  Continue LEVOTHYROXINE 25 mcg daily  No change in Crestor  Follow-up  in 3 months  There are no Patient Instructions on file for this visit.      Elayne Snare 01/02/2022, 9:39 AM   Note: This note was prepared with Dragon voice recognition system technology. Any transcriptional errors that result from this process are unintentional.

## 2022-01-02 NOTE — Progress Notes (Signed)
w

## 2022-02-25 ENCOUNTER — Other Ambulatory Visit: Payer: Self-pay | Admitting: Endocrinology

## 2022-03-22 ENCOUNTER — Other Ambulatory Visit: Payer: Self-pay | Admitting: Endocrinology

## 2022-04-18 ENCOUNTER — Other Ambulatory Visit: Payer: Self-pay

## 2022-04-21 ENCOUNTER — Other Ambulatory Visit: Payer: Self-pay

## 2022-05-03 ENCOUNTER — Other Ambulatory Visit: Payer: Self-pay | Admitting: Endocrinology

## 2022-05-12 ENCOUNTER — Other Ambulatory Visit (INDEPENDENT_AMBULATORY_CARE_PROVIDER_SITE_OTHER): Payer: No Typology Code available for payment source

## 2022-05-12 DIAGNOSIS — E1065 Type 1 diabetes mellitus with hyperglycemia: Secondary | ICD-10-CM | POA: Diagnosis not present

## 2022-05-12 LAB — MICROALBUMIN / CREATININE URINE RATIO
Creatinine,U: 103.3 mg/dL
Microalb Creat Ratio: 0.7 mg/g (ref 0.0–30.0)
Microalb, Ur: 0.7 mg/dL (ref 0.0–1.9)

## 2022-05-12 LAB — GLUCOSE, RANDOM: Glucose, Bld: 160 mg/dL — ABNORMAL HIGH (ref 70–99)

## 2022-05-12 LAB — HEMOGLOBIN A1C: Hgb A1c MFr Bld: 8.2 % — ABNORMAL HIGH (ref 4.6–6.5)

## 2022-05-13 LAB — FRUCTOSAMINE: Fructosamine: 305 umol/L — ABNORMAL HIGH (ref 0–285)

## 2022-05-14 ENCOUNTER — Other Ambulatory Visit: Payer: No Typology Code available for payment source

## 2022-05-20 ENCOUNTER — Ambulatory Visit: Payer: No Typology Code available for payment source | Admitting: Endocrinology

## 2022-05-23 ENCOUNTER — Ambulatory Visit (INDEPENDENT_AMBULATORY_CARE_PROVIDER_SITE_OTHER): Payer: No Typology Code available for payment source | Admitting: Endocrinology

## 2022-05-23 ENCOUNTER — Encounter: Payer: Self-pay | Admitting: Endocrinology

## 2022-05-23 VITALS — BP 122/78 | HR 77 | Ht 62.0 in | Wt 134.0 lb

## 2022-05-23 DIAGNOSIS — E063 Autoimmune thyroiditis: Secondary | ICD-10-CM | POA: Diagnosis not present

## 2022-05-23 DIAGNOSIS — E1065 Type 1 diabetes mellitus with hyperglycemia: Secondary | ICD-10-CM | POA: Diagnosis not present

## 2022-05-23 DIAGNOSIS — E7800 Pure hypercholesterolemia, unspecified: Secondary | ICD-10-CM

## 2022-05-23 NOTE — Patient Instructions (Addendum)
Active insulin 2 hrs  Extra 15-25g carbs for hi fat meals

## 2022-05-23 NOTE — Progress Notes (Unsigned)
Patient ID: Felicia Gardner, female   DOB: 08-17-1960, 61 y.o.   MRN: [id number]           Reason for Appointment : Follow-up for Type 1 Diabetes    History of Present Illness          Diagnosis: Type 1 diabetes mellitus, date of diagnosis: 09/03/15        Diabetes  history:    She presented to the emergency room with weight loss, increased thirst and urination and had marked hyperglycemia  Labs in the emergency room showed >80 ketones in the urine and CO2 down to 15 On her initial consultation she was started on mealtime insulin with NovoLog and Lantus dose was increased to 12 units  RECENT history:    INSULIN regimen is: [employer] pump since 12/09/16, now using 780 pump  Basal rate midnight = 0.45, 6 AM = 0.45, 8 AM = 0.35,  : Bolus settings carbohydrate coverage 1: 6 at breakfast, 1: 7 at lunch and 1:6  at supper  Correction factor I: 55 and target 120, active insulin time 4 hours  Her A1C is not any better at 8.2   Fructosamine now 305  She has had the smart guard mode 93 % of the time with the average insulin dose 36 units  She usually has lower blood sugars on her sensor compared to A1c with GMI 7% for the last 2 weeks   Current blood sugar patterns from CGM analysis: Her blood sugars as still about the same time in range as before with postprandial variability Blood sugars are HIGHEST after dinner OVERNIGHT blood sugars are around 180 average at midnight and then progressively improving until about 5-6 AM and then rising  Premeal readings are generally high based on her data below higher on an average compared to her last visit at lunch  POSTPRANDIAL readings are quite variable at all meals especially dinnertime with rates of both hyperglycemia and low normal or slightly low readings Blood sugar rise is usually minimal after dinner but slightly more active at lunch She did have a couple of significant high readings after breakfast but usually not eating  breakfast Significantly higher compared to Premeal readings hypertension dinner, usually not eating breakfast Hypoglycemia has been seen transiently before dinner or late at night   Current management and problems identified:  She has had no significant change with switching to the 780 pump a couple of months ago on her own  With this she still has periodic hyperglycemia after meals with no consistent pattern even though she thinks her sugars are higher at 3 hours compared to 2 hours However she is still using 4-hour active insulin time instead of the recommended 2 hours Time in range is similar to before Occasionally high readings in the evenings may be related to either high-fat meals or LATE boluses She thinks foods like bagels will make her blood sugar go up significantly No significant hypoglycemia  CGM use % of time   2-week average/GV 160/35  Time in range       70 %  % Time Above 180 21  % Time above 250 7  % Time Below 70 2     PRE-MEAL Fasting Lunch Dinner Bedtime Overall  Glucose range:       Averages:  176 195     POST-MEAL PC Breakfast PC Lunch PC Dinner  Glucose range:     Averages:  202 187   Previously  CGM use % of time  93  2-week average/GV 160/34  Time in range        69%  % Time Above 180 24  % Time above 250 6  % Time Below 70 1     PRE-MEAL Fasting Lunch Dinner Bedtime Overall  Glucose range:       Averages: 148 146 170     POST-MEAL PC Breakfast PC Lunch PC Dinner  Glucose range:     Averages:  158 184    Self-care: The diet that the patient has been following is: generally low fat and moderate in carbohydrates   She is eating Breakfast at 8 am usually peanut butter crackers with coffee; dinner at 6-6:30 pm                      Generally active on weekends around the house  CDE consultation: 3/17 and 7/18  Wt Readings from Last 3 Encounters:  05/23/22 134 lb (60.8 kg)  01/02/22 133 lb 6.4 oz (60.5 kg)  08/29/21 130 lb 12.8 oz (59.3  kg)   Diabetes labs:  Lab Results  Component Value Date   HGBA1C 8.2 (H) 05/12/2022   HGBA1C 8.2 (H) 12/30/2021   HGBA1C 8.8 (H) 08/15/2021   Lab Results  Component Value Date   MICROALBUR 0.7 05/12/2022   LDLCALC 108 (H) 08/15/2021   CREATININE 0.77 12/30/2021    Lab Results  Component Value Date   MICRALBCREAT 0.7 05/12/2022   MICRALBCREAT 1.4 02/12/2021   Lab Results  Component Value Date   FRUCTOSAMINE 305 (H) 05/12/2022   FRUCTOSAMINE 334 (H) 07/14/2019      Allergies as of 05/23/2022       Reactions   Lyumjev [insulin Lispro] Itching        Medication List        Accurate as of May 23, 2022 11:59 PM. If you have any questions, ask your nurse or doctor.          Accu-Chek Guide Me w/Device Kit 1 each by Does not apply route in the morning and at bedtime. Use as instructed to check blood sugar twice daily. DX:E10.65. Replaces Accu Chek link meter.   Accu-Chek Guide test strip Generic drug: glucose blood Use as instructed to test blood sugar 2 times daily E10.65   accu-chek soft touch lancets Use as instructed to test blood sugar 2 times daily E10.65   Alpha Lipoic Acid 200 MG Caps Take by mouth daily.   Chromium Picolinate 1000 MCG Tabs Take by mouth.   Cinnamon 500 MG capsule Take 500 mg by mouth daily.   cyanocobalamin 1000 MCG tablet Commonly known as: VITAMIN B12 Take 1,000 mcg by mouth daily.   HumaLOG 100 UNIT/ML injection Generic drug: insulin lispro INJECT UP TO 50 UNITS DAILY PER INSULIN PUMP   Insulin Pen Needle 32G X 5 MM Misc Use 4 per day to inject Insulin   levothyroxine 25 MCG tablet Commonly known as: SYNTHROID TAKE ONE TABLET BY MOUTH EVERY DAY BEFORE BREAKFAST   LUTEIN 20 PO Take by mouth.   multivitamin with minerals Tabs tablet Take 1 tablet by mouth daily.   rosuvastatin 10 MG tablet Commonly known as: CRESTOR TAKE ONE TABLET BY MOUTH EVERY DAY   VITAMIN D-3 PO Take by mouth daily.         Allergies:  Allergies  Allergen Reactions   Lyumjev [Insulin Lispro] Itching    Past Medical History:  Diagnosis Date   Diabetes mellitus without complication ([place])  08/2015   diagnosed    Past Surgical History:  Procedure Laterality Date   BUNIONECTOMY  2011   right foot   TUBAL LIGATION  1990    Family History  Problem Relation Age of Onset   Heart disease Mother    Berenice Primas' disease Mother    Thyroid disease Sister    Thyroid disease Maternal Grandmother    Diabetes Neg Hx    Hypertension Father    Crohn's disease Father    Rheum arthritis Father    Thyroid disease Sister     Social History:  reports that she has been smoking cigarettes. She has a 41.00 pack-year smoking history. She has never used smokeless tobacco. She reports that she does not drink alcohol and does not use drugs.    Review of Systems        Lipids: She had good control with Crestor, taking 10 mg daily  LDL is low below 100 again   Lab Results  Component Value Date   CHOL 204 (H) 08/15/2021   CHOL 210 (H) 05/13/2021   CHOL 237 (H) 02/12/2021   Lab Results  Component Value Date   HDL 81.80 08/15/2021   HDL 94.60 05/13/2021   HDL 88.90 02/12/2021   Lab Results  Component Value Date   LDLCALC 108 (H) 08/15/2021   LDLCALC 103 (H) 05/13/2021   LDLCALC 137 (H) 02/12/2021   Lab Results  Component Value Date   TRIG 71.0 08/15/2021   TRIG 65.0 05/13/2021   TRIG 59.0 02/12/2021   Lab Results  Component Value Date   CHOLHDL 2 08/15/2021   CHOLHDL 2 05/13/2021   CHOLHDL 3 02/12/2021   Lab Results  Component Value Date   LDLDIRECT 95.0 12/30/2021   LDLDIRECT 134.0 11/13/2017   Lab Results  Component Value Date   ALT 22 08/15/2021      HYPOTHYROIDISM:  TSH has been upper normal previously but was 6.1 as of 8/22  She was complaining of fatigue and is now on 25 mcg levothyroxine After starting this she has improved energy levels Taking her levothyroxine regularly  before breakfast  TSH consistently back to normal  She has a family history of thyroid disease   Lab Results  Component Value Date   TSH 3.37 12/30/2021   TSH 3.34 08/15/2021   TSH 3.38 05/13/2021   FREET4 0.75 08/15/2021   FREET4 0.73 05/13/2021   FREET4 0.56 (L) 05/24/2018     Physical Examination:  BP 122/78   Pulse 77   Ht _0  (1.575 m)   Wt 134 lb (60.8 kg)   SpO2 98%   BMI 24.51 kg/m        ASSESSMENT:  Diabetes type 1, date of diagnosis 2017  See history of present illness for detailed discussion of current diabetes management, blood sugar patterns and problems identified  Her blood sugar patterns and daily insulin pump management was reviewed in detail from her insulin pump download analysis as above  Her A1c is again 8.2 although GMI is 7.1 as before Fructosamine is not as significantly high at 305 likely indicating falsely high A1c  She has postprandial hyperglycemia after some meals This is either related to inadequate or late boluses Also with using 4-hour active insulin time she is not getting the full benefit of the auto correction mode of the 780 pump and this was discussed As before has some variability in her blood sugars that is unpredictable Blood sugars are the least abnormal early morning and  stable  Urine microalbumin normal   PLAN:   Active insulin time to be reduced to 2 hours She does need to try and bolus consistently before eating especially at dinnertime Add an additional 15 to 25 g of carbs if eating any high fat meals such as added cheese or foods like bagels May need to follow-up with diabetes educator also for reassessment   Follow-up in 4 months  Patient Instructions  Active insulin 2 hrs  Extra 15-25g carbs for hi fat meals   Total visit time including counseling = 30 minutes   Elayne Snare 05/26/2022, 9:42 AM   Note: This note was prepared with Dragon voice recognition system technology. Any transcriptional  errors that result from this process are unintentional.

## 2022-05-29 ENCOUNTER — Encounter: Payer: Self-pay | Admitting: Endocrinology

## 2022-08-22 ENCOUNTER — Other Ambulatory Visit: Payer: Self-pay | Admitting: Endocrinology

## 2022-09-22 ENCOUNTER — Other Ambulatory Visit: Payer: No Typology Code available for payment source

## 2022-09-23 ENCOUNTER — Other Ambulatory Visit (INDEPENDENT_AMBULATORY_CARE_PROVIDER_SITE_OTHER): Payer: No Typology Code available for payment source

## 2022-09-23 DIAGNOSIS — E1065 Type 1 diabetes mellitus with hyperglycemia: Secondary | ICD-10-CM | POA: Diagnosis not present

## 2022-09-23 DIAGNOSIS — E7800 Pure hypercholesterolemia, unspecified: Secondary | ICD-10-CM

## 2022-09-23 DIAGNOSIS — E063 Autoimmune thyroiditis: Secondary | ICD-10-CM

## 2022-09-23 LAB — LIPID PANEL
Cholesterol: 189 mg/dL (ref 0–200)
HDL: 85.7 mg/dL (ref >39.00)
LDL Cholesterol: 92 mg/dL (ref 0–99)
NonHDL: 103.26
Total CHOL/HDL Ratio: 2
Triglycerides: 58 mg/dL (ref 0.0–149.0)
VLDL: 11.6 mg/dL (ref 0.0–40.0)

## 2022-09-23 LAB — COMPREHENSIVE METABOLIC PANEL
ALT: 15 U/L (ref 0–35)
AST: 22 U/L (ref 0–37)
Albumin: 4.4 g/dL (ref 3.5–5.2)
Alkaline Phosphatase: 82 U/L (ref 39–117)
BUN: 8 mg/dL (ref 6–23)
CO2: 30 mEq/L (ref 19–32)
Calcium: 9.7 mg/dL (ref 8.4–10.5)
Chloride: 105 mEq/L (ref 96–112)
Creatinine, Ser: 0.71 mg/dL (ref 0.40–1.20)
GFR: 90.57 mL/min (ref >60.00)
Glucose, Bld: 139 mg/dL — ABNORMAL HIGH (ref 70–99)
Potassium: 4.1 mEq/L (ref 3.5–5.1)
Sodium: 140 mEq/L (ref 135–145)
Total Bilirubin: 0.4 mg/dL (ref 0.2–1.2)
Total Protein: 6.7 g/dL (ref 6.0–8.3)

## 2022-09-23 LAB — TSH: TSH: 3.03 u[IU]/mL (ref 0.35–5.50)

## 2022-09-23 LAB — HEMOGLOBIN A1C: Hgb A1c MFr Bld: 7.9 % — ABNORMAL HIGH (ref 4.6–6.5)

## 2022-09-26 ENCOUNTER — Ambulatory Visit: Payer: No Typology Code available for payment source | Admitting: Endocrinology

## 2022-09-26 ENCOUNTER — Encounter: Payer: Self-pay | Admitting: Endocrinology

## 2022-09-26 VITALS — BP 132/82 | HR 74 | Ht 62.0 in | Wt 130.0 lb

## 2022-09-26 DIAGNOSIS — E1065 Type 1 diabetes mellitus with hyperglycemia: Secondary | ICD-10-CM | POA: Diagnosis not present

## 2022-09-26 DIAGNOSIS — E063 Autoimmune thyroiditis: Secondary | ICD-10-CM | POA: Diagnosis not present

## 2022-09-26 DIAGNOSIS — E7800 Pure hypercholesterolemia, unspecified: Secondary | ICD-10-CM | POA: Diagnosis not present

## 2022-09-26 NOTE — Patient Instructions (Signed)
Boluses:  Try to bolus for 8 g of carbs in the mornings for coffee unless the blood sugar is low normal or going down  If you know what given to you try to bolus 5 to 10 minutes before starting to eat at least  For any higher fat meals of fried food add another 30% more to the carbohydrate amount.  If you are planning to be active turn on the exercise/activity mode 15 minutes before you plan to be active

## 2022-09-26 NOTE — Progress Notes (Signed)
Patient ID: Felicia Gardner, female   DOB: 27-May-1961, 62 y.o.   MRN: [id number]           Reason for Appointment : Follow-up for Type 1 Diabetes    History of Present Illness          Diagnosis: Type 1 diabetes mellitus, date of diagnosis: 09/03/15        Diabetes  history:    She presented to the emergency room with weight loss, increased thirst and urination and had marked hyperglycemia  Labs in the emergency room showed >80 ketones in the urine and CO2 down to 15 On her initial consultation she was started on mealtime insulin with NovoLog and Lantus dose was increased to 12 units  RECENT history:    INSULIN regimen is: [employer] pump since 12/09/16, now using 780 pump  Basal rate midnight = 0.45, 6 AM = 0.45, 8 AM = 0.35,  : Bolus settings carbohydrate coverage 1: 6 at breakfast, 1: 7 at lunch and 1:6  at supper  Correction factor I: 55 and target 120, active insulin time 2 hours  Her A1C is 7.9, previously 8.2   Fructosamine 305 previously  She has had the smart guard mode 96 % of the time with the average insulin dose about 30 units  She usually has lower blood sugars on her sensor compared to A1c with GMI 7% for the last 2 weeks   Current blood sugar patterns from CGM analysis: Her blood sugars show better time in range now at 79% Appears to have less hypoglycemia after meals overall compared to previous visit However highest blood sugars are still after lunch and periodically after dinner also  OVERNIGHT blood sugars are overall better controlled and at least up to 2 AM through 5 AM appears to be excellent around 120 average Premeal readings are are averaging as below and relatively higher at 175 at dinnertime partly related to late boluses  POSTPRANDIAL readings are relatively well-controlled after lunch with some variability After dinner blood sugars may sometimes tend to be relatively lower postmeal However sporadic will have significant hypoglycemia after  different meals Currently not eating breakfast   Current management and problems identified:  She has had some improvement in her level of control with using the 2-hour active insulin time compared to 3 hours on her last visit Also time in range is improving As above her postprandial readings are not consistently controlled This is related to eating certain foods like bagels, high-fat foods, eating out and not making adjustments as previously discussed Sometimes she is not bolusing ahead of time with her meals causing blood sugars to rise initially Blood sugar will periodically rise to as much is 250-300 with inadequate boluses  Reviewed random blood sugars are somewhat low possibly from late boluses or occasionally stacking boluses Overall variability is somewhat better than before At times she will be more active and may not compensate for this with temporary target or adjustment of bolus  CGM use % of time   2-week average/GV 154  Time in range 79  %  % Time Above 180 17  % Time above 250 2  % Time Below 70 2     PRE-MEAL Fasting Lunch Dinner Bedtime Overall  Glucose range:       Averages:  156 175     2-hour POST-MEAL PC Breakfast PC Lunch PC Dinner  Glucose range:     Averages:  184 157   Previously:  CGM use % of  time   2-week average/GV 160/35  Time in range       70 %  % Time Above 180 21  % Time above 250 7  % Time Below 70 2     PRE-MEAL Fasting Lunch Dinner Bedtime Overall  Glucose range:       Averages:  176 195     POST-MEAL PC Breakfast PC Lunch PC Dinner  Glucose range:     Averages:  202 187      Self-care: The diet that the patient has been following is: generally low fat and moderate in carbohydrates   She is eating Breakfast at 8 am usually peanut butter crackers with coffee; dinner at 6-6:30 pm                      Generally active on weekends around the house  CDE consultation: 3/17 and 7/18  Wt Readings from Last 3 Encounters:   09/26/22 130 lb (59 kg)  05/23/22 134 lb (60.8 kg)  01/02/22 133 lb 6.4 oz (60.5 kg)   Diabetes labs:  Lab Results  Component Value Date   HGBA1C 7.9 (H) 09/23/2022   HGBA1C 8.2 (H) 05/12/2022   HGBA1C 8.2 (H) 12/30/2021   Lab Results  Component Value Date   MICROALBUR 0.7 05/12/2022   LDLCALC 92 09/23/2022   CREATININE 0.71 09/23/2022    Lab Results  Component Value Date   MICRALBCREAT 0.7 05/12/2022   MICRALBCREAT 1.4 02/12/2021   Lab Results  Component Value Date   FRUCTOSAMINE 305 (H) 05/12/2022   FRUCTOSAMINE 334 (H) 07/14/2019      Allergies as of 09/26/2022       Reactions   Lyumjev [insulin Lispro] Itching        Medication List        Accurate as of September 26, 2022 11:59 PM. If you have any questions, ask your nurse or doctor.          Accu-Chek Guide Me w/Device Kit 1 each by Does not apply route in the morning and at bedtime. Use as instructed to check blood sugar twice daily. DX:E10.65. Replaces Accu Chek link meter.   Accu-Chek Guide test strip Generic drug: glucose blood Use as instructed to test blood sugar 2 times daily E10.65   accu-chek soft touch lancets Use as instructed to test blood sugar 2 times daily E10.65   Alpha Lipoic Acid 200 MG Caps Take by mouth daily.   Chromium Picolinate 1000 MCG Tabs Take by mouth.   Cinnamon 500 MG capsule Take 500 mg by mouth daily.   cyanocobalamin 1000 MCG tablet Commonly known as: VITAMIN B12 Take 1,000 mcg by mouth daily.   HumaLOG 100 UNIT/ML injection Generic drug: insulin lispro INJECT UP TO 50 UNITS DAILY PER INSULIN PUMP   Insulin Pen Needle 32G X 5 MM Misc Use 4 per day to inject Insulin   levothyroxine 25 MCG tablet Commonly known as: SYNTHROID TAKE ONE TABLET BY MOUTH EVERY DAY BEFORE BREAKFAST   LUTEIN 20 PO Take by mouth.   multivitamin with minerals Tabs tablet Take 1 tablet by mouth daily.   rosuvastatin 10 MG tablet Commonly known as: CRESTOR TAKE ONE TABLET  BY MOUTH EVERY DAY   VITAMIN D-3 PO Take by mouth daily.        Allergies:  Allergies  Allergen Reactions   Lyumjev [Insulin Lispro] Itching    Past Medical History:  Diagnosis Date   Diabetes mellitus without complication 08/2015  diagnosed    Past Surgical History:  Procedure Laterality Date   BUNIONECTOMY  2011   right foot   TUBAL LIGATION  1990    Family History  Problem Relation Age of Onset   Heart disease Mother    Luiz Blare' disease Mother    Thyroid disease Sister    Thyroid disease Maternal Grandmother    Diabetes Neg Hx    Hypertension Father    Crohn's disease Father    Rheum arthritis Father    Thyroid disease Sister     Social History:  reports that she has been smoking cigarettes. She has a 41.00 pack-year smoking history. She has never used smokeless tobacco. She reports that she does not drink alcohol and does not use drugs.    Review of Systems        Lipids: She had good control with Crestor, taking 10 mg daily  LDL is low below 100 again   Lab Results  Component Value Date   CHOL 189 09/23/2022   CHOL 204 (H) 08/15/2021   CHOL 210 (H) 05/13/2021   Lab Results  Component Value Date   HDL 85.70 09/23/2022   HDL 81.80 08/15/2021   HDL 94.60 05/13/2021   Lab Results  Component Value Date   LDLCALC 92 09/23/2022   LDLCALC 108 (H) 08/15/2021   LDLCALC 103 (H) 05/13/2021   Lab Results  Component Value Date   TRIG 58.0 09/23/2022   TRIG 71.0 08/15/2021   TRIG 65.0 05/13/2021   Lab Results  Component Value Date   CHOLHDL 2 09/23/2022   CHOLHDL 2 08/15/2021   CHOLHDL 2 05/13/2021   Lab Results  Component Value Date   LDLDIRECT 95.0 12/30/2021   LDLDIRECT 134.0 11/13/2017   Lab Results  Component Value Date   ALT 15 09/23/2022      HYPOTHYROIDISM:  TSH has been upper normal previously but was 6.1 as of 8/22  She was complaining of fatigue and is now on 25 mcg levothyroxine After starting this she has improved  energy levels Taking her levothyroxine regularly before breakfast  TSH consistently back to normal  She has a family history of thyroid disease   Lab Results  Component Value Date   TSH 3.03 09/23/2022   TSH 3.37 12/30/2021   TSH 3.34 08/15/2021   FREET4 0.75 08/15/2021   FREET4 0.73 05/13/2021   FREET4 0.56 (L) 05/24/2018     Physical Examination:  BP 132/82   Pulse 74   Ht 5\' 2"  (1.575 m)   Wt 130 lb (59 kg)   SpO2 96%   BMI 23.78 kg/m        ASSESSMENT:  Diabetes type 1, date of diagnosis 2017  See history of present illness for detailed discussion of current diabetes management, blood sugar patterns and problems identified  Her blood sugar patterns and daily insulin pump management was reviewed in detail from her insulin pump download analysis as above  Her A1c is again   although GMI is 7.1 as before Fructosamine is not as significantly high at 305 likely indicating falsely high A1c  She has variable postprandial hyperglycemia after some meals   This is either related to inadequate or late boluses Also with using 4-hour active insulin time she is not getting the full benefit of the auto correction mode of the 780 pump and this was discussed As before has some variability in her blood sugars that is unpredictable Blood sugars are the least abnormal early morning and stable  HYPOTHYROIDISM: Thyroid levels continue to be normal with current dose of 25 mcg levothyroxine  Lipids: LDL controlled with 10 mg rosuvastatin   PLAN:  Patient instructions for bolus management:  Try to bolus for 8 g of carbs in the mornings for coffee unless the blood sugar is low normal or going down  If you know what given to you try to bolus 5 to 10 minutes before starting to eat at least  For any higher fat meals of fried food add another 30% more to the carbohydrate amount.  If you are planning to be active turn on the exercise/activity mode 15 minutes before you plan to  be active This was shown to her today  She does need to try and bolus consistently before eating especially at dinnertime  Add an additional 15 to 25 g of carbs if eating any high fat meals such as added cheese or foods like bagels  No change in carbohydrate coverage or other settings  Needs eye exam and given a couple of names of ophthalmologist to establish care with  Follow-up in 4 months  Patient Instructions  Boluses:  Try to bolus for 8 g of carbs in the mornings for coffee unless the blood sugar is low normal or going down  If you know what given to you try to bolus 5 to 10 minutes before starting to eat at least  For any higher fat meals of fried food add another 30% more to the carbohydrate amount.  If you are planning to be active turn on the exercise/activity mode 15 minutes before you plan to be active     Total visit time including counseling = 30 minutes   Reather Littler 09/27/2022, 5:21 PM   Note: This note was prepared with Dragon voice recognition system technology. Any transcriptional errors that result from this process are unintentional.

## 2022-10-01 ENCOUNTER — Encounter: Payer: Self-pay | Admitting: Endocrinology

## 2022-10-09 ENCOUNTER — Telehealth: Payer: Self-pay | Admitting: Endocrinology

## 2022-10-09 NOTE — Telephone Encounter (Signed)
Prescription was received and signed to [employer] via [employer] portal.

## 2022-10-09 NOTE — Telephone Encounter (Signed)
MEDICATION: Tubing and Reservoir for pump  PHARMACY:  [facility]  HAS THE PATIENT CONTACTED THEIR PHARMACY?  Yes  IS THIS A 90 DAY SUPPLY : Yes  IS PATIENT OUT OF MEDICATION: No  IF NOT; HOW MUCH IS LEFT: On last one  LAST APPOINTMENT DATE: /10/2022  NEXT APPOINTMENT DATE:@8 /11/2022  DO WE HAVE YOUR PERMISSION TO LEAVE A DETAILED MESSAGE?: Yes  OTHER COMMENTS:    **Let patient know to contact pharmacy at the end of the day to make sure medication is ready. **  ** Please notify patient to allow 48-72 hours to process**  **Encourage patient to contact the pharmacy for refills or they can request refills through [facility]**

## 2022-11-25 ENCOUNTER — Other Ambulatory Visit: Payer: Self-pay | Admitting: Endocrinology

## 2023-01-05 ENCOUNTER — Other Ambulatory Visit: Payer: Self-pay | Admitting: Endocrinology

## 2023-01-22 ENCOUNTER — Other Ambulatory Visit (INDEPENDENT_AMBULATORY_CARE_PROVIDER_SITE_OTHER): Payer: Self-pay

## 2023-01-22 DIAGNOSIS — E1065 Type 1 diabetes mellitus with hyperglycemia: Secondary | ICD-10-CM

## 2023-01-22 LAB — GLUCOSE, RANDOM: Glucose, Bld: 128 mg/dL — ABNORMAL HIGH (ref 70–99)

## 2023-01-22 LAB — HEMOGLOBIN A1C: Hgb A1c MFr Bld: 7.8 % — ABNORMAL HIGH (ref 4.6–6.5)

## 2023-01-27 ENCOUNTER — Ambulatory Visit (INDEPENDENT_AMBULATORY_CARE_PROVIDER_SITE_OTHER): Payer: No Typology Code available for payment source | Admitting: Endocrinology

## 2023-01-27 ENCOUNTER — Encounter: Payer: Self-pay | Admitting: Endocrinology

## 2023-01-27 VITALS — BP 124/80 | HR 65 | Ht 62.0 in | Wt 130.6 lb

## 2023-01-27 DIAGNOSIS — E063 Autoimmune thyroiditis: Secondary | ICD-10-CM | POA: Diagnosis not present

## 2023-01-27 DIAGNOSIS — E1065 Type 1 diabetes mellitus with hyperglycemia: Secondary | ICD-10-CM

## 2023-01-27 NOTE — Progress Notes (Signed)
Patient ID: Felicia Gardner, female   DOB: [birth year], 62 y.o.   MRN: [id number]           Reason for Appointment : Follow-up for Type 1 Diabetes    History of Present Illness          Diagnosis: Type 1 diabetes mellitus, date of diagnosis: 09/03/15        Diabetes  history:    She presented to the emergency room with weight loss, increased thirst and urination and had marked hyperglycemia  Labs in the emergency room showed >80 ketones in the urine and CO2 down to 15 On her initial consultation she was started on mealtime insulin with NovoLog and Lantus dose was increased to 12 units  RECENT history:    INSULIN regimen is: Medtronic pump since 12/09/16, using 780 pump  Basal rate midnight = 0.45, 6 AM = 0.45, 8 AM = 0.35,  : Bolus settings carbohydrate coverage 1: 6 at breakfast, 1: 7 at lunch and 1:6  at supper  Correction factor I: 55 and target 120, active insulin time 2 hours  Her A1C is 7.8 and about the same  Fructosamine 305 previously  She has had the smart guard mode 93 % of the time with the average insulin dose about 32 units  She usually has lower blood sugars on her sensor compared to A1c with GMI 6.6 % for the last 2 weeks   Current blood sugar patterns from CGM analysis: Her blood sugars show better time in range now at 87 compared to 79 % She has some periods of hyperglycemia after meals but otherwise blood sugars generally well-controlled compared to last visit Recently highest blood sugars are appearing to be after dinner and only occasionally after lunch If she has hypoglycemia after lunch it may be on a weekday also and blood sugars show sharp rise Also periodically may tend to have low blood sugar after her lunch bolus, not usually after dinner  OVERNIGHT blood sugars are very stable at a good range especially after 3-4 AM  As above hypoglycemia with minimal related to boluses  Current management and problems identified:  She has had further  improvement in her level of control She is again trying to bolus consistently before starting to eat but may be apparently late with her bolus as judged by her CGM data, at least at lunch Occasionally she thinks she feels hypoglycemic mid afternoon even though she is not active at that time Also may have some higher readings if she underestimates her carbohydrates or use a higher fat lunch with rapid rise in blood sugars at the meal However she has overall progressively better readings after lunch over the last 2 visits Currently still benefiting from the smart guard mode and getting about 5 units/day in the auto correction insulin Usually not using temporary target when she is active at work without any difficulties  CGM use % of time 91  2-week average/GV 139/24  Time in range 87       %  % Time Above 180 12  % Time above 250   % Time Below 70 1     PRE-MEAL Fasting Lunch Dinner Bedtime Overall  Glucose range:       Averages:  136 150     POST-MEAL PC Breakfast PC Lunch PC Dinner  Glucose range:     Averages:  169 147   Previously:  CGM use % of time   2-week average/GV 154  Time in  range 79  %  % Time Above 180 17  % Time above 250 2  % Time Below 70 2     PRE-MEAL Fasting Lunch Dinner Bedtime Overall  Glucose range:       Averages:  156 175     2-hour POST-MEAL PC Breakfast PC Lunch PC Dinner  Glucose range:     Averages:  184 157       Self-care: The diet that the patient has been following is: generally low fat and moderate in carbohydrates   She is eating Breakfast at 8 am usually peanut butter crackers with coffee; dinner at 6-6:30 pm                      Generally active on weekends around the house  CDE consultation: 3/17 and 7/18  Wt Readings from Last 3 Encounters:  01/27/23 130 lb 9.6 oz (59.2 kg)  09/26/22 130 lb (59 kg)  05/23/22 134 lb (60.8 kg)   Diabetes labs:  Lab Results  Component Value Date   HGBA1C 7.8 (H) 01/22/2023   HGBA1C 7.9  (H) 09/23/2022   HGBA1C 8.2 (H) 05/12/2022   Lab Results  Component Value Date   MICROALBUR 0.7 05/12/2022   LDLCALC 92 09/23/2022   CREATININE 0.71 09/23/2022    Lab Results  Component Value Date   MICRALBCREAT 0.7 05/12/2022   MICRALBCREAT 1.4 02/12/2021   Lab Results  Component Value Date   FRUCTOSAMINE 305 (H) 05/12/2022   FRUCTOSAMINE 334 (H) 07/14/2019      Allergies as of 01/27/2023       Reactions   Lyumjev [insulin Lispro] Itching        Medication List        Accurate as of January 27, 2023  2:49 PM. If you have any questions, ask your nurse or doctor.          Accu-Chek Guide Me w/Device Kit 1 each by Does not apply route in the morning and at bedtime. Use as instructed to check blood sugar twice daily. DX:E10.65. Replaces Accu Chek link meter.   Accu-Chek Guide test strip Generic drug: glucose blood Use as instructed to test blood sugar 2 times daily E10.65   accu-chek soft touch lancets Use as instructed to test blood sugar 2 times daily E10.65   Alpha Lipoic Acid 200 MG Caps Take by mouth daily.   Chromium Picolinate 1000 MCG Tabs Take by mouth.   Cinnamon 500 MG capsule Take 500 mg by mouth daily.   cyanocobalamin 1000 MCG tablet Commonly known as: VITAMIN B12 Take 1,000 mcg by mouth daily.   HumaLOG 100 UNIT/ML injection Generic drug: insulin lispro INJECT UP TO 50 UNITS DAILY PER INSULIN PUMP   Insulin Pen Needle 32G X 5 MM Misc Use 4 per day to inject Insulin   levothyroxine 25 MCG tablet Commonly known as: SYNTHROID TAKE ONE TABLET BY MOUTH EVERY DAY BEFORE BREAKFAST   LUTEIN 20 PO Take by mouth.   multivitamin with minerals Tabs tablet Take 1 tablet by mouth daily.   rosuvastatin 10 MG tablet Commonly known as: CRESTOR TAKE ONE TABLET BY MOUTH EVERY DAY   VITAMIN D-3 PO Take by mouth daily.        Allergies:  Allergies  Allergen Reactions   Lyumjev [Insulin Lispro] Itching    Past Medical History:   Diagnosis Date   Diabetes mellitus without complication (HCC) 08/2015   diagnosed    Past Surgical History:  Procedure Laterality Date   BUNIONECTOMY  2011   right foot   TUBAL LIGATION  1990    Family History  Problem Relation Age of Onset   Heart disease Mother    Luiz Blare' disease Mother    Thyroid disease Sister    Thyroid disease Maternal Grandmother    Diabetes Neg Hx    Hypertension Father    Crohn's disease Father    Rheum arthritis Father    Thyroid disease Sister     Social History:  reports that she has been smoking cigarettes. She has a 41 pack-year smoking history. She has never used smokeless tobacco. She reports that she does not drink alcohol and does not use drugs.    Review of Systems        Lipids: She had good control with Crestor, taking 10 mg daily  LDL is low below 100 again   Lab Results  Component Value Date   CHOL 189 09/23/2022   CHOL 204 (H) 08/15/2021   CHOL 210 (H) 05/13/2021   Lab Results  Component Value Date   HDL 85.70 09/23/2022   HDL 81.80 08/15/2021   HDL 94.60 05/13/2021   Lab Results  Component Value Date   LDLCALC 92 09/23/2022   LDLCALC 108 (H) 08/15/2021   LDLCALC 103 (H) 05/13/2021   Lab Results  Component Value Date   TRIG 58.0 09/23/2022   TRIG 71.0 08/15/2021   TRIG 65.0 05/13/2021   Lab Results  Component Value Date   CHOLHDL 2 09/23/2022   CHOLHDL 2 08/15/2021   CHOLHDL 2 05/13/2021   Lab Results  Component Value Date   LDLDIRECT 95.0 12/30/2021   LDLDIRECT 134.0 11/13/2017   Lab Results  Component Value Date   ALT 15 09/23/2022      HYPOTHYROIDISM:  TSH has been upper normal previously, previously was high at 6.1 as of 8/22  She was complaining of fatigue and is now on 25 mcg levothyroxine After starting this she has improved energy levels Taking her levothyroxine regularly before breakfast  TSH consistently back to normal  She has a family history of thyroid disease   Lab Results   Component Value Date   TSH 3.03 09/23/2022   TSH 3.37 12/30/2021   TSH 3.34 08/15/2021   FREET4 0.75 08/15/2021   FREET4 0.73 05/13/2021   FREET4 0.56 (L) 05/24/2018   No complaints of tingling in her feet  Physical Examination:  BP 124/80   Pulse 65   Ht 5\' 2"  (1.575 m)   Wt 130 lb 9.6 oz (59.2 kg)   SpO2 95%   BMI 23.89 kg/m    Diabetic Foot Exam - Simple   Simple Foot Form Diabetic Foot exam was performed with the following findings: Yes   Visual Inspection No deformities, no ulcerations, no other skin breakdown bilaterally: Yes Sensation Testing Intact to touch and monofilament testing bilaterally: Yes Pulse Check See comments: Yes Comments Decreased pedal pulses bilaterally, 1/4.  Toes show normal skin temperature        ASSESSMENT:  Diabetes type 1, date of diagnosis 2017  See history of present illness for detailed discussion of current diabetes management, blood sugar patterns and problems identified  Her blood sugar patterns and daily insulin pump management was reviewed in detail from her insulin pump download analysis as above  Her A1c is again higher than expected at 7.8  although GMI is only 6.6 Fructosamine usually only increased  Her time in range is much better at  87% which is excellent  Occasionally make sure she related to inadequate or late boluses However she is concerned about tendency to low sugars mid afternoon and her average to 2 hours after eating is 169  HYPOTHYROIDISM: Thyroid levels previously normal with 25 mcg levothyroxine   PLAN:  Her day-to-day management and the results of her CGM was discussed in detail Discussed timing of the bolus, adjustment for any higher fat meals Also try to estimate carbohydrates more accurately and to bolus earlier for any relatively high carbohydrate meals especially at lunch Try reducing carb ratio at lunch to 8 to see if this will reduce her tendency to mid afternoon hypoglycemia  Since she  has high readings in the morning when her sensor is not working she will raise her background basal rate to 0.5 at 6 AM and 0.45 at 8 AM    Follow-up in 4 months  There are no Patient Instructions on file for this visit.   Total visit time including counseling = 30 minutes   Reather Littler 01/27/2023, 2:49 PM   Note: This note was prepared with Dragon voice recognition system technology. Any transcriptional errors that result from this process are unintentional.

## 2023-01-28 ENCOUNTER — Encounter: Payer: Self-pay | Admitting: Endocrinology

## 2023-06-30 ENCOUNTER — Telehealth: Payer: Self-pay

## 2023-06-30 DIAGNOSIS — E1065 Type 1 diabetes mellitus with hyperglycemia: Secondary | ICD-10-CM

## 2023-06-30 NOTE — Telephone Encounter (Signed)
 Orders Placed This Encounter  Procedures   TSH   Fructosamine   Basic metabolic panel   Hemoglobin A1c

## 2023-07-01 ENCOUNTER — Other Ambulatory Visit: Payer: No Typology Code available for payment source

## 2023-07-03 LAB — BASIC METABOLIC PANEL
BUN: 12 mg/dL (ref 7–25)
CO2: 31 mmol/L (ref 20–32)
Calcium: 9.9 mg/dL (ref 8.6–10.4)
Chloride: 103 mmol/L (ref 98–110)
Creat: 0.62 mg/dL (ref 0.50–1.05)
Glucose, Bld: 151 mg/dL — ABNORMAL HIGH (ref 65–99)
Potassium: 4.8 mmol/L (ref 3.5–5.3)
Sodium: 142 mmol/L (ref 135–146)

## 2023-07-03 LAB — HEMOGLOBIN A1C
Hgb A1c MFr Bld: 8 %{Hb} — ABNORMAL HIGH (ref <5.7)
Mean Plasma Glucose: 183 mg/dL
eAG (mmol/L): 10.1 mmol/L

## 2023-07-03 LAB — TSH: TSH: 5.66 m[IU]/L — ABNORMAL HIGH (ref 0.40–4.50)

## 2023-07-03 LAB — FRUCTOSAMINE: Fructosamine: 306 umol/L — ABNORMAL HIGH (ref 205–285)

## 2023-07-06 ENCOUNTER — Encounter: Payer: Self-pay | Admitting: Endocrinology

## 2023-07-06 ENCOUNTER — Ambulatory Visit (INDEPENDENT_AMBULATORY_CARE_PROVIDER_SITE_OTHER): Payer: No Typology Code available for payment source | Admitting: Endocrinology

## 2023-07-06 VITALS — BP 144/82 | HR 88 | Ht 62.0 in | Wt 135.4 lb

## 2023-07-06 DIAGNOSIS — E7800 Pure hypercholesterolemia, unspecified: Secondary | ICD-10-CM

## 2023-07-06 DIAGNOSIS — E1065 Type 1 diabetes mellitus with hyperglycemia: Secondary | ICD-10-CM | POA: Diagnosis not present

## 2023-07-06 DIAGNOSIS — E063 Autoimmune thyroiditis: Secondary | ICD-10-CM | POA: Diagnosis not present

## 2023-07-06 MED ORDER — HUMALOG 100 UNIT/ML IJ SOLN: INTRAMUSCULAR | 2 refills | Status: DC

## 2023-07-06 MED ORDER — LEVOTHYROXINE SODIUM 25 MCG PO TABS: 25.0000 ug | ORAL_TABLET | Freq: Every day | ORAL | 3 refills | Status: DC

## 2023-07-06 MED ORDER — ROSUVASTATIN CALCIUM 10 MG PO TABS: 10.0000 mg | ORAL_TABLET | Freq: Every day | ORAL | 4 refills | Status: DC

## 2023-07-06 NOTE — Progress Notes (Addendum)
 Outpatient Endocrinology Note Imonie Tuch, MD   Patient's Name: Felicia Gardner    DOB: 06/04/1961    MRN: [id number]                                                    REASON OF VISIT: Follow up for type 1 diabetes mellitus /hypothyroidism  PCP: Pcp, No  HISTORY OF PRESENT ILLNESS:   Selene Peltzer is a 63 y.o. old female with past medical history listed below, is here for follow up for type 1 diabetes mellitus /hypothyroidism  Pertinent Diabetes History: Patient was previously seen by Dr. Von and was last time seen in August 2024.  Diagnosed in 08/2025 as type 1 diabetes mellitus: she presented to the emergency room with weight loss, increased thirst and urination and had marked hyperglycemia. Labs in the emergency room showed >80 ketones in the urine and CO2 down to 15. On her initial consultation she was started on mealtime insulin  with NovoLog and Lantus  dose was increased to 12 units.   Insulin  pump therapy was started in June 2018 Medtronic pump.  History of DKA or diabetes related hospitalizations: at the time of diagnosis.  She usually has lower blood sugars on her sensor and better GMI compared to A1C.  Chronic Diabetes Complications : Retinopathy: unknown. Last ophthalmology exam was done on Due. Peripheral neuropathy: no Coronary artery disease: no Stroke: no  Relevant comorbidities and cardiovascular risk factors: Obesity: no Body mass index is 24.76 kg/m.  Hypertension: no  Hyperlipidemia : Yes, on statin.   Current / Home Diabetic regimen includes:   Medtronic insulin  pump therapy using 780G, using Humalog  U100  Insulin  Pump setting:  Basal MN- 0.450u/hour 6AM- 0.500  8AM- 0.450  Bolus CHO Ratio (1unit:CHO) MN- 1:6 12AM- 1:8 6PM- 1:6 8PM- 1:7  Correction/Sensitivity: MN- 1:55  Target: 120  Active insulin  time: 2 hours  Prior diabetic medications: Basal bolus regimen prior to insulin  pump therapy.  CONTINUOUS GLUCOSE MONITORING SYSTEM  (CGMS) / INSULIN  PUMP INTERPRETATION:        Medtronic Pump & Sensor Download (Reviewed and summarized below.) Medtronic 780G Dates: December 31 to July 06, 2023, 14 days Smartcard mode (per week): 96% GMI: 6.7%  Average daily carbs entered: 62 Average total daily insulin :   32 units, Bolus: 53%,  Basal: 47%    Interpretation: Mostly acceptable blood sugar overnight and in between the meals.  She has frequent hyperglycemia with blood sugar up to 250 range especially with lunch and sometime with supper and breakfast related with late meal bolus.  Postprandial hyperglycemia is followed by rapid trending down with a blood sugar with occasional hypoglycemia and low normal blood sugar after meal bolus.  She had a skipped meal bolus occasionally due to fear of hypoglycemia, leading to postprandial hyperglycemia.  No concerning hypoglycemia.  Hypoglycemia: Patient has no significant hypoglycemic episodes. Patient has hypoglycemia awareness.    Factors modifying glucose control: 1.  Diabetic diet assessment: 3 meals a day.  2.  Staying active or exercising:   3.  Medication compliance: compliant most of the time.  # Primary hypothyroidism -Patient had TSH as high as 6.1 in August 2022.  She has been on levothyroxine  25 mcg daily.  He has symptoms of fatigue and improved with taking levothyroxine .  She has a family history of thyroid  disorder.  Interval  history  Pump and CGM data as reviewed above.  Recent hemoglobin A1c 8.0% however GMI on pump/CGM 6.7%.  She has no complaints of vision problem.  No numbness and ting of the feet.  She had recent TSH mildly elevated.  She reports that she has not taking levothyroxine  about 3 weeks, was not able to refill.  No other complaints today.  Recent lab results reviewed.  REVIEW OF SYSTEMS As per history of present illness.   PAST MEDICAL HISTORY: Past Medical History:  Diagnosis Date   Diabetes mellitus without complication (HCC) 08/2015    diagnosed    PAST SURGICAL HISTORY: Past Surgical History:  Procedure Laterality Date   BUNIONECTOMY  2011   right foot   TUBAL LIGATION  1990    ALLERGIES: Allergies  Allergen Reactions   Lyumjev  [Insulin  Lispro] Itching    FAMILY HISTORY:  Family History  Problem Relation Age of Onset   Heart disease Mother    Yvone' disease Mother    Thyroid  disease Sister    Thyroid  disease Maternal Grandmother    Diabetes Neg Hx    Hypertension Father    Crohn's disease Father    Rheum arthritis Father    Thyroid  disease Sister     SOCIAL HISTORY: Social History   Socioeconomic History   Marital status: Married    Spouse name: Not on file   Number of children: Not on file   Years of education: Not on file   Highest education level: Not on file  Occupational History   Occupation: rehab tech  Tobacco Use   Smoking status: Every Day    Current packs/day: 1.00    Average packs/day: 1 pack/day for 41.0 years (41.0 ttl pk-yrs)    Types: Cigarettes   Smokeless tobacco: Never  Substance and Sexual Activity   Alcohol use: No    Alcohol/week: 0.0 standard drinks of alcohol   Drug use: No   Sexual activity: Not on file  Other Topics Concern   Not on file  Social History Narrative   Not on file   Social Drivers of Health   Financial Resource Strain: Not on file  Food Insecurity: Not on file  Transportation Needs: Not on file  Physical Activity: Not on file  Stress: Not on file  Social Connections: Not on file    MEDICATIONS:  Current Outpatient Medications  Medication Sig Dispense Refill   Alpha Lipoic Acid 200 MG CAPS Take by mouth daily.      Blood Glucose Monitoring Suppl (ACCU-CHEK GUIDE ME) w/Device KIT 1 each by Does not apply route in the morning and at bedtime. Use as instructed to check blood sugar twice daily. DX:E10.65. Replaces Accu Chek link meter. 1 kit 0   Cholecalciferol (VITAMIN D-3 PO) Take by mouth daily.      Chromium Picolinate 1000 MCG TABS  Take by mouth.     Cinnamon 500 MG capsule Take 500 mg by mouth daily.     glucose blood (ACCU-CHEK GUIDE) test strip Use as instructed to test blood sugar 2 times daily E10.65 100 each 12   Insulin  Pen Needle 32G X 5 MM MISC Use 4 per day to inject Insulin  150 each 5   Lancets (ACCU-CHEK SOFT TOUCH) lancets Use as instructed to test blood sugar 2 times daily E10.65 100 each 12   Misc Natural Products (LUTEIN 20 PO) Take by mouth.     Multiple Vitamin (MULTIVITAMIN WITH MINERALS) TABS tablet Take 1 tablet by mouth daily.  vitamin B-12 (CYANOCOBALAMIN) 1000 MCG tablet Take 1,000 mcg by mouth daily.     HUMALOG  100 UNIT/ML injection INJECT UP TO 50 UNITS DAILY PER INSULIN  PUMP 60 mL 2   levothyroxine  (SYNTHROID ) 25 MCG tablet Take 1 tablet (25 mcg total) by mouth daily before breakfast. 90 tablet 3   rosuvastatin  (CRESTOR ) 10 MG tablet Take 1 tablet (10 mg total) by mouth daily. 90 tablet 4   No current facility-administered medications for this visit.    PHYSICAL EXAM: Vitals:   07/06/23 0851  BP: (!) 144/82  Pulse: 88  SpO2: 96%  Weight: 135 lb 6.4 oz (61.4 kg)  Height: 5' 2 (1.575 m)   Body mass index is 24.76 kg/m.  Wt Readings from Last 3 Encounters:  07/06/23 135 lb 6.4 oz (61.4 kg)  01/27/23 130 lb 9.6 oz (59.2 kg)  09/26/22 130 lb (59 kg)    General: Well developed, well nourished female in no apparent distress.  HEENT: AT/[place], no external lesions.  Eyes: Conjunctiva clear and no icterus. Neck: Neck supple  Lungs: Respirations not labored Neurologic: Alert, oriented, normal speech Extremities / Skin: Dry. No sores or rashes noted.  Psychiatric: Does not appear depressed or anxious  Diabetic Foot Exam - Simple   No data filed    LABS Reviewed Lab Results  Component Value Date   HGBA1C 8.0 (H) 07/01/2023   HGBA1C 7.8 (H) 01/22/2023   HGBA1C 7.9 (H) 09/23/2022   Lab Results  Component Value Date   FRUCTOSAMINE 306 (H) 07/01/2023   FRUCTOSAMINE 305 (H)  05/12/2022   FRUCTOSAMINE 334 (H) 07/14/2019   Lab Results  Component Value Date   CHOL 189 09/23/2022   HDL 85.70 09/23/2022   LDLCALC 92 09/23/2022   LDLDIRECT 95.0 12/30/2021   TRIG 58.0 09/23/2022   CHOLHDL 2 09/23/2022   Lab Results  Component Value Date   MICRALBCREAT 0.7 05/12/2022   MICRALBCREAT 1.4 02/12/2021   Lab Results  Component Value Date   CREATININE 0.62 07/01/2023   Lab Results  Component Value Date   GFR 90.57 09/23/2022    ASSESSMENT / PLAN  1. Uncontrolled type 1 diabetes mellitus with hyperglycemia (HCC)   2. Acquired autoimmune hypothyroidism   3. Hypercholesterolemia     Diabetes Mellitus type 1, complicated by no known complications. - Diabetic status / severity: Fair control based on GMI on CGM 6.7%.  Lab Results  Component Value Date   HGBA1C 8.0 (H) 07/01/2023    - Hemoglobin A1c goal <6.5%   She has postprandial hyperglycemia followed by trending down and occasional mild hypoglycemia.  She skpis meal boluses sometimes due to fear of hypoglycemia.  Discussed about bolusing for meals 10 to 15 minutes before eating.  Changed carb ratio as follows.  Adjusted basal rates to match with her basal insulin  requirement.  - Medications:  Insulin  pump setting changed as follows:  Medtronic insulin  pump therapy using 780G, using Humalog  U100  Insulin  Pump setting:  Basal MN- 0.450u/hour, changed to 0.5 6AM- 0.500 , changed to 0.6 8AM- 0.450, changed to 0.6 At 8:10 PM : 0.5 units/hr  Bolus CHO Ratio (1unit:CHO) MN- 1:6, changed to 1 : 10 12AM- 1:8, changed to 1 : 10 6PM- 1:6, changed to 1 : 10 8PM- 1:7, changed to 1 : 10  Correction/Sensitivity: MN- 1:55  Target: 120  Active insulin  time: 2 hours  - Home glucose testing: continue CGM and check blood glucose as needed.  - Discussed/ Gave Hypoglycemia treatment plan.  #  Consult : not required at this time.   # Annual urine for microalbuminuria/ creatinine ratio, no  microalbuminuria currently, will check in next 8 of lab. Last  Lab Results  Component Value Date   MICRALBCREAT 0.7 05/12/2022    # Foot check nightly / neuropathy.  # Annual dilated diabetic eye exams.  Advised for diabetic eye exam.  - Diet: Make healthy diabetic food choices - Life style / activity / exercise: Discussed.  2. Blood pressure  -  BP Readings from Last 1 Encounters:  07/06/23 (!) 144/82    - Control is not in target. Mildly high is acceptable for now. - No change in current plans.  3. Lipid status / Hyperlipidemia - Last  Lab Results  Component Value Date   LDLCALC 92 09/23/2022   - Continue rosuvastatin  10 mg daily. -Will check lipid panel in next set of lab.  # Primary hypothyroidism -Recent TSH mildly elevated however she was not taking levothyroxine  for about 3 weeks, not able to refill. -Continue current dose of levothyroxine  25 mcg daily.  Medication renewed. -Check thyroid  function test prior to follow-up visit.  Mintie was seen today for diabetes.  Diagnoses and all orders for this visit:  Uncontrolled type 1 diabetes mellitus with hyperglycemia (HCC) -     Microalbumin / creatinine urine ratio -     Lipid panel -     BASIC METABOLIC PANEL WITH GFR -     Hemoglobin A1c  Acquired autoimmune hypothyroidism -     T4, free -     TSH  Hypercholesterolemia -     Lipid panel  Other orders -     levothyroxine  (SYNTHROID ) 25 MCG tablet; Take 1 tablet (25 mcg total) by mouth daily before breakfast. -     rosuvastatin  (CRESTOR ) 10 MG tablet; Take 1 tablet (10 mg total) by mouth daily. -     HUMALOG  100 UNIT/ML injection; INJECT UP TO 50 UNITS DAILY PER INSULIN  PUMP    DISPOSITION Follow up in clinic in 3 months suggested.   All questions answered and patient verbalized understanding of the plan.  Ahmarion Saraceno, MD [facility] Endocrinology [facility] [address] [place], [address] [place], [place] [address] Phone #  [phone]  At least part of this note was generated using voice recognition software. Inadvertent word errors may have occurred, which were not recognized during the proofreading process.

## 2023-07-08 ENCOUNTER — Encounter: Payer: Self-pay | Admitting: Endocrinology

## 2023-09-25 ENCOUNTER — Other Ambulatory Visit: Payer: Self-pay

## 2023-10-05 ENCOUNTER — Other Ambulatory Visit: Payer: No Typology Code available for payment source

## 2023-10-06 ENCOUNTER — Encounter: Payer: Self-pay | Admitting: Endocrinology

## 2023-10-06 LAB — LIPID PANEL
Cholesterol: 180 mg/dL (ref <200)
HDL: 87 mg/dL (ref >=50)
LDL Cholesterol (Calc): 79 mg/dL
Non-HDL Cholesterol (Calc): 93 mg/dL (ref <130)
Total CHOL/HDL Ratio: 2.1 (calc) (ref <5.0)
Triglycerides: 55 mg/dL (ref <150)

## 2023-10-06 LAB — BASIC METABOLIC PANEL WITH GFR
BUN: 11 mg/dL (ref 7–25)
CO2: 35 mmol/L — ABNORMAL HIGH (ref 20–32)
Calcium: 9.7 mg/dL (ref 8.6–10.4)
Chloride: 104 mmol/L (ref 98–110)
Creat: 0.67 mg/dL (ref 0.50–1.05)
Glucose, Bld: 134 mg/dL — ABNORMAL HIGH (ref 65–99)
Potassium: 4.9 mmol/L (ref 3.5–5.3)
Sodium: 141 mmol/L (ref 135–146)
eGFR: 98 mL/min/{1.73_m2} (ref >=60)

## 2023-10-06 LAB — HEMOGLOBIN A1C
Hgb A1c MFr Bld: 8.5 % — ABNORMAL HIGH (ref <5.7)
Mean Plasma Glucose: 197 mg/dL
eAG (mmol/L): 10.9 mmol/L

## 2023-10-06 LAB — MICROALBUMIN / CREATININE URINE RATIO
Creatinine, Urine: 67 mg/dL (ref 20–275)
Microalb Creat Ratio: 4 mg/g{creat} (ref <30)
Microalb, Ur: 0.3 mg/dL

## 2023-10-06 LAB — T4, FREE: Free T4: 1 ng/dL (ref 0.8–1.8)

## 2023-10-06 LAB — TSH: TSH: 2.8 m[IU]/L (ref 0.40–4.50)

## 2023-10-14 ENCOUNTER — Ambulatory Visit: Payer: No Typology Code available for payment source | Admitting: Endocrinology

## 2023-10-14 ENCOUNTER — Encounter: Payer: Self-pay | Admitting: Endocrinology

## 2023-10-14 VITALS — BP 118/60 | HR 66 | Resp 20 | Ht 62.0 in | Wt 131.0 lb

## 2023-10-14 DIAGNOSIS — E063 Autoimmune thyroiditis: Secondary | ICD-10-CM

## 2023-10-14 DIAGNOSIS — E1065 Type 1 diabetes mellitus with hyperglycemia: Secondary | ICD-10-CM

## 2023-10-14 NOTE — Progress Notes (Addendum)
 Outpatient Endocrinology Note Felicia Zaylee Cornia, MD   Patient's Name: Felicia Gardner    DOB: 07-26-1960    MRN: [id number]                                                    REASON OF VISIT: Follow up for type 1 diabetes mellitus / hypothyroidism  PCP: Pcp, No  HISTORY OF PRESENT ILLNESS:   Felicia Gardner is a 63 y.o. old female with past medical history listed below, is here for follow up for type 1 diabetes mellitus / hypothyroidism  Pertinent Diabetes History: Patient was previously seen by Dr. Hubert Madden and was last time seen in August 2024.  Diagnosed in 08/2015 as type 1 diabetes mellitus: she presented to the emergency room with weight loss, increased thirst and urination and had marked hyperglycemia. Labs in the emergency room showed >80 ketones in the urine and CO2 down to 15. On her initial consultation she was started on mealtime insulin  with NovoLog and Lantus  dose was increased to 12 units.   Insulin  pump therapy was started in June 2018 Medtronic pump.  History of DKA or diabetes related hospitalizations: at the time of diagnosis.  She usually has lower blood sugars on her sensor and better GMI compared to A1C.  Chronic Diabetes Complications : Retinopathy: unknown. Last ophthalmology exam was done on Due.  She reports she never had diabetic eye exam. Peripheral neuropathy: no Coronary artery disease: no Stroke: no  Relevant comorbidities and cardiovascular risk factors: Obesity: no Body mass index is 23.96 kg/m.  Hypertension: no  Hyperlipidemia : Yes, on statin.   Current / Home Diabetic regimen includes:   Medtronic insulin  pump therapy using 780G, using Humalog  U100  Insulin  Pump setting:  Basal MN- 0.500 u/hour 6AM- 0.600  8AM- 0.600 10PM- 0.500  Bolus CHO Ratio (1unit:CHO) MN- 1:10 12AM- 1:10 6PM- 1:10 8PM- 1:10  Correction/Sensitivity: MN- 1:55  Target: 120  Active insulin  time: 2 hours  Prior diabetic medications: Basal bolus regimen prior to  insulin  pump therapy.  CONTINUOUS GLUCOSE MONITORING SYSTEM (CGMS) / INSULIN  PUMP INTERPRETATION:        Medtronic Pump & Sensor Download (Reviewed and summarized below.) Medtronic 780G Dates: April 10 to October 14, 2023, 14 days Smartcard mode (per week): 98% Sensor wear 96% GMI: 7.2%   Average total daily insulin :   28.5 units, Bolus: 58%,  Basal: 42%    Interpretation: Mostly acceptable blood sugar with occasional random hyperglycemia with blood sugar of 200-250 range usually with late meal bolus.  Some of the days acceptable blood sugar.  Rarely hyperglycemia related to no meal bolus.  No hypoglycemia.  Blood sugar in between the meals and overnight acceptable.  Time in range 71%.  Hypoglycemia: Patient has no significant hypoglycemic episodes. Patient has hypoglycemia awareness.    Factors modifying glucose control: 1.  Diabetic diet assessment: 3 meals a day.  2.  Staying active or exercising:   3.  Medication compliance: compliant most of the time.  # Primary hypothyroidism -Patient had TSH as high as 6.1 in August 2022.  She has been on levothyroxine  25 mcg daily.  He has symptoms of fatigue and improved with taking levothyroxine .  She has a family history of thyroid  disorder.  Interval history  Pump and CGM data as reviewed above.  Hemoglobin A1c 8.5% however CGM  GMI is 7.2% with sensor uses 96%.  She has been taking levothyroxine  25 mcg daily.  She has normalized TSH.  No hypo and hyperthyroid symptoms.  She has no other complaints today.  Recent laboratory results reviewed.  Normal electrolytes and renal function.  Cholesterol level acceptable.  Urine microalbumin creatinine ratio normal.  REVIEW OF SYSTEMS As per history of present illness.   PAST MEDICAL HISTORY: Past Medical History:  Diagnosis Date   Diabetes mellitus without complication (HCC) 08/2015   diagnosed    PAST SURGICAL HISTORY: Past Surgical History:  Procedure Laterality Date   BUNIONECTOMY   2011   right foot   TUBAL LIGATION  1990    ALLERGIES: Allergies  Allergen Reactions   Lyumjev  [Insulin  Lispro] Itching    FAMILY HISTORY:  Family History  Problem Relation Age of Onset   Heart disease Mother    Murrell Arrant' disease Mother    Thyroid  disease Sister    Thyroid  disease Maternal Grandmother    Diabetes Neg Hx    Hypertension Father    Crohn's disease Father    Rheum arthritis Father    Thyroid  disease Sister     SOCIAL HISTORY: Social History   Socioeconomic History   Marital status: Married    Spouse name: Not on file   Number of children: Not on file   Years of education: Not on file   Highest education level: Not on file  Occupational History   Occupation: rehab tech  Tobacco Use   Smoking status: Every Day    Current packs/day: 1.00    Average packs/day: 1 pack/day for 41.0 years (41.0 ttl pk-yrs)    Types: Cigarettes   Smokeless tobacco: Never  Substance and Sexual Activity   Alcohol use: No    Alcohol/week: 0.0 standard drinks of alcohol   Drug use: No   Sexual activity: Not on file  Other Topics Concern   Not on file  Social History Narrative   Not on file   Social Drivers of Health   Financial Resource Strain: Not on file  Food Insecurity: Not on file  Transportation Needs: Not on file  Physical Activity: Not on file  Stress: Not on file  Social Connections: Not on file    MEDICATIONS:  Current Outpatient Medications  Medication Sig Dispense Refill   Alpha Lipoic Acid 200 MG CAPS Take by mouth daily.      Blood Glucose Monitoring Suppl (ACCU-CHEK GUIDE ME) w/Device KIT 1 each by Does not apply route in the morning and at bedtime. Use as instructed to check blood sugar twice daily. DX:E10.65. Replaces Accu Chek link meter. 1 kit 0   Cholecalciferol (VITAMIN D-3 PO) Take by mouth daily.      Chromium Picolinate 1000 MCG TABS Take by mouth.     Cinnamon 500 MG capsule Take 500 mg by mouth daily.     glucose blood (ACCU-CHEK GUIDE)  test strip Use as instructed to test blood sugar 2 times daily E10.65 100 each 12   HUMALOG  100 UNIT/ML injection INJECT UP TO 50 UNITS DAILY PER INSULIN  PUMP 60 mL 2   Insulin  Pen Needle 32G X 5 MM MISC Use 4 per day to inject Insulin  150 each 5   Lancets (ACCU-CHEK SOFT TOUCH) lancets Use as instructed to test blood sugar 2 times daily E10.65 100 each 12   levothyroxine  (SYNTHROID ) 25 MCG tablet Take 1 tablet (25 mcg total) by mouth daily before breakfast. 90 tablet 3   Misc Natural  Products (LUTEIN 20 PO) Take by mouth.     Multiple Vitamin (MULTIVITAMIN WITH MINERALS) TABS tablet Take 1 tablet by mouth daily.     rosuvastatin  (CRESTOR ) 10 MG tablet Take 1 tablet (10 mg total) by mouth daily. 90 tablet 4   vitamin B-12 (CYANOCOBALAMIN) 1000 MCG tablet Take 1,000 mcg by mouth daily.     No current facility-administered medications for this visit.    PHYSICAL EXAM: Vitals:   10/14/23 1550  BP: 118/60  Pulse: 66  Resp: 20  SpO2: 97%  Weight: 131 lb (59.4 kg)  Height: 5\' 2"  (1.575 m)   Body mass index is 23.96 kg/m.  Wt Readings from Last 3 Encounters:  10/14/23 131 lb (59.4 kg)  07/06/23 135 lb 6.4 oz (61.4 kg)  01/27/23 130 lb 9.6 oz (59.2 kg)    General: Well developed, well nourished female in no apparent distress.  HEENT: AT/[place], no external lesions.  Eyes: Conjunctiva clear and no icterus. Neck: Neck supple  Lungs: Respirations not labored Neurologic: Alert, oriented, normal speech Extremities / Skin: Dry.  Psychiatric: Does not appear depressed or anxious  Diabetic Foot Exam - Simple   No data filed    LABS Reviewed Lab Results  Component Value Date   HGBA1C 8.5 (H) 10/05/2023   HGBA1C 8.0 (H) 07/01/2023   HGBA1C 7.8 (H) 01/22/2023   Lab Results  Component Value Date   FRUCTOSAMINE 306 (H) 07/01/2023   FRUCTOSAMINE 305 (H) 05/12/2022   FRUCTOSAMINE 334 (H) 07/14/2019   Lab Results  Component Value Date   CHOL 180 10/05/2023   HDL 87 10/05/2023    LDLCALC 79 10/05/2023   LDLDIRECT 95.0 12/30/2021   TRIG 55 10/05/2023   CHOLHDL 2.1 10/05/2023   Lab Results  Component Value Date   MICRALBCREAT 4 10/05/2023   MICRALBCREAT 0.7 05/12/2022   Lab Results  Component Value Date   CREATININE 0.67 10/05/2023   Lab Results  Component Value Date   GFR 90.57 09/23/2022    ASSESSMENT / PLAN  1. Uncontrolled type 1 diabetes mellitus with hyperglycemia (HCC)   2. Acquired autoimmune hypothyroidism      Diabetes Mellitus type 1, complicated by no known complications. - Diabetic status / severity: Fair control based on GMI on CGM 6.7%.  Lab Results  Component Value Date   HGBA1C 8.5 (H) 10/05/2023    - Hemoglobin A1c goal <6.5%   GMI on CGM 7.2%.  She has discrepancy on hemoglobin A1c and GMI CGM.  With the sensor uses of 96% GMI is close to accurate.    Discussed about meal bolusing for all the meals preferably 10 to 15 minutes before eating.   - Medications:  Insulin  pump setting changed as follows:  Continue Medtronic insulin  pump therapy using 780G, using Humalog  U100  Insulin  Pump setting: No change on the settings today.   - Home glucose testing: continue CGM and check blood glucose as needed.  - Discussed/ Gave Hypoglycemia treatment plan.  # Consult : not required at this time.   # Annual urine for microalbuminuria/ creatinine ratio, no microalbuminuria currently, will check in next 8 of lab. Last  Lab Results  Component Value Date   MICRALBCREAT 4 10/05/2023    # Foot check nightly / neuropathy.  # Annual dilated diabetic eye exams.  Advised for diabetic eye exam.  Referred to ophthalmology for diabetic eye exam.  - Diet: Make healthy diabetic food choices - Life style / activity / exercise: Discussed.  2. Blood  pressure  -  BP Readings from Last 1 Encounters:  10/14/23 118/60    - Control is in target.  - No change in current plans.  3. Lipid status / Hyperlipidemia - Last  Lab Results   Component Value Date   LDLCALC 79 10/05/2023   - Continue rosuvastatin  10 mg daily.  # Primary hypothyroidism -Continue levothyroxine  25 mcg daily.  Diagnoses and all orders for this visit:  Uncontrolled type 1 diabetes mellitus with hyperglycemia (HCC) -     Ambulatory referral to Ophthalmology  Acquired autoimmune hypothyroidism   DISPOSITION Follow up in clinic in 4 months suggested.   All questions answered and patient verbalized understanding of the plan.  Felicia Kyle Stansell, MD [facility] Endocrinology [facility] [address] [place], [address] [place], [place] [address] Phone # [phone]  At least part of this note was generated using voice recognition software. Inadvertent word errors may have occurred, which were not recognized during the proofreading process.

## 2023-10-19 ENCOUNTER — Encounter: Payer: Self-pay | Admitting: Endocrinology

## 2024-02-15 ENCOUNTER — Ambulatory Visit: Payer: Self-pay | Admitting: Endocrinology

## 2024-02-15 ENCOUNTER — Encounter: Payer: Self-pay | Admitting: Endocrinology

## 2024-02-15 ENCOUNTER — Ambulatory Visit (INDEPENDENT_AMBULATORY_CARE_PROVIDER_SITE_OTHER): Admitting: Endocrinology

## 2024-02-15 VITALS — BP 126/70 | HR 65 | Resp 20 | Ht 62.0 in | Wt 128.0 lb

## 2024-02-15 DIAGNOSIS — E1065 Type 1 diabetes mellitus with hyperglycemia: Secondary | ICD-10-CM | POA: Diagnosis not present

## 2024-02-15 DIAGNOSIS — E063 Autoimmune thyroiditis: Secondary | ICD-10-CM

## 2024-02-15 LAB — POCT GLYCOSYLATED HEMOGLOBIN (HGB A1C): Hemoglobin A1C: 8.5 % — AB (ref 4.0–5.6)

## 2024-02-15 NOTE — Progress Notes (Unsigned)
 Outpatient Endocrinology Note [place] Gurshaan Matsuoka, MD   Patient's Name: Felicia Gardner    DOB: 08/20/1960    MRN: [id number]                                                    REASON OF VISIT: Follow up for type 1 diabetes mellitus / hypothyroidism  PCP: Pcp, No  HISTORY OF PRESENT ILLNESS:   Felicia Gardner is a 63 y.o. old female with past medical history listed below, is here for follow up for type 1 diabetes mellitus / hypothyroidism  Pertinent Diabetes History: Patient was previously seen by Dr. Von and was last time seen in August 2024.  Diagnosed in 08/2015 as type 1 diabetes mellitus: she presented to the emergency room with weight loss, increased thirst and urination and had marked hyperglycemia. Labs in the emergency room showed >80 ketones in the urine and CO2 down to 15. On her initial consultation she was started on mealtime insulin  with NovoLog and Lantus  dose was increased to 12 units.   Insulin  pump therapy was started in June 2018 Medtronic pump.  History of DKA or diabetes related hospitalizations: at the time of diagnosis.  She usually has lower blood sugars on her sensor and better GMI compared to A1C.  Chronic Diabetes Complications : Retinopathy: unknown. Last ophthalmology exam was done on Due.  She reports she never had diabetic eye exam. Peripheral neuropathy: no Coronary artery disease: no Stroke: no  Relevant comorbidities and cardiovascular risk factors: Obesity: no Body mass index is 23.41 kg/m.  Hypertension: no  Hyperlipidemia : Yes, on statin.   Current / Home Diabetic regimen includes:   Medtronic insulin  pump therapy using 780G, using Humalog  U100  Insulin  Pump setting:  Basal MN- 0.500 u/hour 6AM- 0.600  8AM- 0.600 10PM- 0.500  Bolus CHO Ratio (1unit:CHO) MN- 1:10 12AM- 1:10 6PM- 1:10 8PM- 1:10  Correction/Sensitivity: MN- 1:55, changed to 45  Target: 120  Active insulin  time: 2 hours  Prior diabetic medications: Basal bolus  regimen prior to insulin  pump therapy.  CONTINUOUS GLUCOSE MONITORING SYSTEM (CGMS) / INSULIN  PUMP INTERPRETATION:        Medtronic Pump & Sensor Download (Reviewed and summarized below.) Medtronic 780G Dates: August 12 to August 25 , 2025, 14 days Smartcard mode (per week): 98% Sensor wear 96% GMI: 7.4%   Average total daily insulin :   29 units, Bolus: 68%,  Basal: 32%     Interpretation: Random postprandial hyperglycemia with different meals with blood sugar up to 250s range sometime with breakfast, sometimes with lunch and sometimes with dinner.  Blood sugar in between the meals and overnight mostly acceptable.  No hypoglycemia.  Hypoglycemia: Patient has no significant hypoglycemic episodes. Patient has hypoglycemia awareness.    Factors modifying glucose control: 1.  Diabetic diet assessment: 3 meals a day.  2.  Staying active or exercising:   3.  Medication compliance: compliant most of the time.  # Primary hypothyroidism -Patient had TSH as high as 6.1 in August 2022.  She has been on levothyroxine  25 mcg daily.  He has symptoms of fatigue and improved with taking levothyroxine .  She has a family history of thyroid  disorder.  Interval history  Pump and CGM data as reviewed above.  Hemoglobin A1c 8.5% however CGM GMI is 7.2% with sensor uses 96%.  She has been  taking levothyroxine  25 mcg daily.  She has normalized TSH.  No hypo and hyperthyroid symptoms.  She has no other complaints today.  Recent laboratory results reviewed.  Normal electrolytes and renal function.  Cholesterol level acceptable.  Urine microalbumin creatinine ratio normal.  REVIEW OF SYSTEMS As per history of present illness.   PAST MEDICAL HISTORY: Past Medical History:  Diagnosis Date   Diabetes mellitus without complication (HCC) 08/2015   diagnosed    PAST SURGICAL HISTORY: Past Surgical History:  Procedure Laterality Date   BUNIONECTOMY  2011   right foot   TUBAL LIGATION  1990     ALLERGIES: Allergies  Allergen Reactions   Lyumjev  [Insulin  Lispro] Itching    FAMILY HISTORY:  Family History  Problem Relation Age of Onset   Heart disease Mother    Yvone' disease Mother    Thyroid  disease Sister    Thyroid  disease Maternal Grandmother    Diabetes Neg Hx    Hypertension Father    Crohn's disease Father    Rheum arthritis Father    Thyroid  disease Sister     SOCIAL HISTORY: Social History   Socioeconomic History   Marital status: Married    Spouse name: Not on file   Number of children: Not on file   Years of education: Not on file   Highest education level: Not on file  Occupational History   Occupation: rehab tech  Tobacco Use   Smoking status: Every Day    Current packs/day: 1.00    Average packs/day: 1 pack/day for 41.0 years (41.0 ttl pk-yrs)    Types: Cigarettes   Smokeless tobacco: Never  Substance and Sexual Activity   Alcohol use: No    Alcohol/week: 0.0 standard drinks of alcohol   Drug use: No   Sexual activity: Not on file  Other Topics Concern   Not on file  Social History Narrative   Not on file   Social Drivers of Health   Financial Resource Strain: Not on file  Food Insecurity: Not on file  Transportation Needs: Not on file  Physical Activity: Not on file  Stress: Not on file  Social Connections: Not on file    MEDICATIONS:  Current Outpatient Medications  Medication Sig Dispense Refill   Alpha Lipoic Acid 200 MG CAPS Take by mouth daily.      Blood Glucose Monitoring Suppl (ACCU-CHEK GUIDE ME) w/Device KIT 1 each by Does not apply route in the morning and at bedtime. Use as instructed to check blood sugar twice daily. DX:E10.65. Replaces Accu Chek link meter. 1 kit 0   Cholecalciferol (VITAMIN D-3 PO) Take by mouth daily.      Chromium Picolinate 1000 MCG TABS Take by mouth.     Cinnamon 500 MG capsule Take 500 mg by mouth daily.     glucose blood (ACCU-CHEK GUIDE) test strip Use as instructed to test blood  sugar 2 times daily E10.65 100 each 12   HUMALOG  100 UNIT/ML injection INJECT UP TO 50 UNITS DAILY PER INSULIN  PUMP 60 mL 2   Insulin  Pen Needle 32G X 5 MM MISC Use 4 per day to inject Insulin  150 each 5   Lancets (ACCU-CHEK SOFT TOUCH) lancets Use as instructed to test blood sugar 2 times daily E10.65 100 each 12   levothyroxine  (SYNTHROID ) 25 MCG tablet Take 1 tablet (25 mcg total) by mouth daily before breakfast. 90 tablet 3   Misc Natural Products (LUTEIN 20 PO) Take by mouth.  Multiple Vitamin (MULTIVITAMIN WITH MINERALS) TABS tablet Take 1 tablet by mouth daily.     rosuvastatin  (CRESTOR ) 10 MG tablet Take 1 tablet (10 mg total) by mouth daily. 90 tablet 4   vitamin B-12 (CYANOCOBALAMIN) 1000 MCG tablet Take 1,000 mcg by mouth daily.     No current facility-administered medications for this visit.    PHYSICAL EXAM: Vitals:   02/15/24 1602  BP: 126/70  Pulse: 65  Resp: 20  SpO2: 96%  Weight: 128 lb (58.1 kg)  Height: 5' 2 (1.575 m)   Body mass index is 23.41 kg/m.  Wt Readings from Last 3 Encounters:  02/15/24 128 lb (58.1 kg)  10/14/23 131 lb (59.4 kg)  07/06/23 135 lb 6.4 oz (61.4 kg)    General: Well developed, well nourished female in no apparent distress.  HEENT: AT/[place], no external lesions.  Eyes: Conjunctiva clear and no icterus. Neck: Neck supple  Lungs: Respirations not labored Neurologic: Alert, oriented, normal speech Extremities / Skin: Dry.  Psychiatric: Does not appear depressed or anxious  Diabetic Foot Exam - Simple   No data filed    LABS Reviewed Lab Results  Component Value Date   HGBA1C 8.5 (A) 02/15/2024   HGBA1C 8.5 (H) 10/05/2023   HGBA1C 8.0 (H) 07/01/2023   Lab Results  Component Value Date   FRUCTOSAMINE 306 (H) 07/01/2023   FRUCTOSAMINE 305 (H) 05/12/2022   FRUCTOSAMINE 334 (H) 07/14/2019   Lab Results  Component Value Date   CHOL 180 10/05/2023   HDL 87 10/05/2023   LDLCALC 79 10/05/2023   LDLDIRECT 95.0 12/30/2021    TRIG 55 10/05/2023   CHOLHDL 2.1 10/05/2023   Lab Results  Component Value Date   MICRALBCREAT 4 10/05/2023   MICRALBCREAT 4 10/04/2015   Lab Results  Component Value Date   CREATININE 0.67 10/05/2023   Lab Results  Component Value Date   GFR 90.57 09/23/2022    ASSESSMENT / PLAN  1. Uncontrolled type 1 diabetes mellitus with hyperglycemia (HCC)       Diabetes Mellitus type 1, complicated by no known complications. - Diabetic status / severity: Fair control based on GMI on CGM 6.7%.  Lab Results  Component Value Date   HGBA1C 8.5 (A) 02/15/2024    - Hemoglobin A1c goal <6.5%   GMI on CGM 7.2%.  She has discrepancy on hemoglobin A1c and GMI CGM.  With the sensor uses of 96% GMI is close to accurate.    Discussed about meal bolusing for all the meals preferably 10 to 15 minutes before eating.   - Medications:  Insulin  pump setting changed as follows:  Continue Medtronic insulin  pump therapy using 780G, using Humalog  U100  Insulin  Pump setting: No change on the settings today.   - Home glucose testing: continue CGM and check blood glucose as needed.  - Discussed/ Gave Hypoglycemia treatment plan.  # Consult : not required at this time.   # Annual urine for microalbuminuria/ creatinine ratio, no microalbuminuria currently, will check in next 8 of lab. Last  Lab Results  Component Value Date   MICRALBCREAT 4 10/05/2023    # Foot check nightly / neuropathy.  # Annual dilated diabetic eye exams.  Advised for diabetic eye exam.  Referred to ophthalmology for diabetic eye exam.  - Diet: Make healthy diabetic food choices - Life style / activity / exercise: Discussed.  2. Blood pressure  -  BP Readings from Last 1 Encounters:  02/15/24 126/70    - Control  is in target.  - No change in current plans.  3. Lipid status / Hyperlipidemia - Last  Lab Results  Component Value Date   LDLCALC 79 10/05/2023   - Continue rosuvastatin  10 mg daily.  #  Primary hypothyroidism -Continue levothyroxine  25 mcg daily.  Diagnoses and all orders for this visit:  Uncontrolled type 1 diabetes mellitus with hyperglycemia (HCC) -     POCT glycosylated hemoglobin (Hb A1C)    DISPOSITION Follow up in clinic in 4 months suggested.   All questions answered and patient verbalized understanding of the plan.  [place] Satori Krabill, MD [facility] Endocrinology [facility] [address] [place], [address] [place], [place] [address] Phone # [phone]  At least part of this note was generated using voice recognition software. Inadvertent word errors may have occurred, which were not recognized during the proofreading process.

## 2024-02-16 ENCOUNTER — Encounter: Payer: Self-pay | Admitting: Endocrinology

## 2024-02-19 ENCOUNTER — Encounter: Payer: Self-pay | Admitting: Endocrinology

## 2024-05-16 ENCOUNTER — Other Ambulatory Visit: Payer: Self-pay | Admitting: Endocrinology

## 2024-05-17 NOTE — Telephone Encounter (Signed)
 Refill request complete

## 2024-05-25 ENCOUNTER — Other Ambulatory Visit: Payer: Self-pay

## 2024-05-25 DIAGNOSIS — E1165 Type 2 diabetes mellitus with hyperglycemia: Secondary | ICD-10-CM

## 2024-05-25 DIAGNOSIS — E0590 Thyrotoxicosis, unspecified without thyrotoxic crisis or storm: Secondary | ICD-10-CM

## 2024-05-25 MED ORDER — ROSUVASTATIN CALCIUM 10 MG PO TABS: 10.0000 mg | ORAL_TABLET | Freq: Every day | ORAL | 4 refills | Status: AC

## 2024-05-25 MED ORDER — LEVOTHYROXINE SODIUM 25 MCG PO TABS: 25.0000 ug | ORAL_TABLET | Freq: Every day | ORAL | 3 refills | Status: AC

## 2024-06-20 ENCOUNTER — Encounter: Payer: Self-pay | Admitting: Endocrinology

## 2024-06-20 ENCOUNTER — Ambulatory Visit: Admitting: Endocrinology

## 2024-06-20 ENCOUNTER — Ambulatory Visit: Payer: Self-pay | Admitting: Endocrinology

## 2024-06-20 VITALS — BP 102/78 | HR 89 | Resp 16 | Ht 62.0 in | Wt 125.8 lb

## 2024-06-20 DIAGNOSIS — E063 Autoimmune thyroiditis: Secondary | ICD-10-CM | POA: Diagnosis not present

## 2024-06-20 DIAGNOSIS — E1065 Type 1 diabetes mellitus with hyperglycemia: Secondary | ICD-10-CM

## 2024-06-20 DIAGNOSIS — E7800 Pure hypercholesterolemia, unspecified: Secondary | ICD-10-CM | POA: Diagnosis not present

## 2024-06-20 LAB — POCT GLYCOSYLATED HEMOGLOBIN (HGB A1C): Hemoglobin A1C: 8.5 % — AB (ref 4.0–5.6)

## 2024-06-20 NOTE — Progress Notes (Signed)
 "  Outpatient Endocrinology Note Ewart Carrera, MD   Patient's Name: Felicia Gardner    DOB: [birth year]    MRN: [id number]                                                    REASON OF VISIT: Follow up for type 1 diabetes mellitus / hypothyroidism  PCP: Pcp, No  HISTORY OF PRESENT ILLNESS:   Glenn Christo is a 63 y.o. old female with past medical history listed below, is here for follow up for type 1 diabetes mellitus / hypothyroidism  Pertinent Diabetes History: Patient was previously seen by Dr. Von and was last time seen in August 2024.  Diagnosed in 08/2015 as type 1 diabetes mellitus: she presented to the emergency room with weight loss, increased thirst and urination and had marked hyperglycemia. Labs in the emergency room showed >80 ketones in the urine and CO2 down to 15. On her initial consultation she was started on mealtime insulin  with NovoLog and Lantus  dose was increased to 12 units.   Insulin  pump therapy was started in June 2018 Medtronic pump.  History of DKA or diabetes related hospitalizations: at the time of diagnosis.  She usually has lower blood sugars on her sensor and better GMI compared to A1C.  Chronic Diabetes Complications : Retinopathy: unknown. Last ophthalmology exam was done on Due.  She reports she never had diabetic eye exam. Peripheral neuropathy: no Coronary artery disease: no Stroke: no  Relevant comorbidities and cardiovascular risk factors: Obesity: no Body mass index is 23.01 kg/m.  Hypertension: no  Hyperlipidemia : Yes, on statin.   Current / Home Diabetic regimen includes:   Medtronic 780G insulin  pump therapy using 780G, using Humalog  U100  Insulin  Pump setting:  Basal MN- 0.500 u/hour 6AM- 0.600  8AM- 0.600 10PM- 0.500  Bolus CHO Ratio (1unit:CHO) MN- 1:10 12AM- 1:10 6PM- 1:10 8PM- 1:10  Correction/Sensitivity: MN- 1:45  Target: 120  Active insulin  time: 2 hours  Prior diabetic medications: Basal bolus regimen  prior to insulin  pump therapy.  CONTINUOUS GLUCOSE MONITORING SYSTEM (CGMS) / INSULIN  PUMP INTERPRETATION:        Medtronic Pump & Sensor Download (Reviewed and summarized below.) Medtronic 780G Dates: December 16 to June 20, 2024, 14 days Smartcard mode (per week):95% Sensor wear 92% GMI: 7.4%   Average total daily insulin :   33 units, Bolus: 62%,  Basal: 42%      Interpretation: Occasional postprandial hyperglycemia especially with supper, some time with lunch and rarely with breakfast with blood sugar up to 250-300 range.  Some of the days acceptable blood sugar.  No hypoglycemia.  Hypoglycemia: Patient has no significant hypoglycemic episodes. Patient has hypoglycemia awareness.    Factors modifying glucose control: 1.  Diabetic diet assessment: 3 meals a day.  2.  Staying active or exercising:   3.  Medication compliance: compliant most of the time.  # Primary hypothyroidism -Patient had TSH as high as 6.1 in August 2022.  She has been on levothyroxine  25 mcg daily.  He has symptoms of fatigue and improved with taking levothyroxine .  She has a family history of thyroid  disorder.  Interval history  Insulin  pump and CGM data as reviewed above.  Hemoglobin A1c 8.5% today GMI on CGM 7.4%.  She has occasional postprandial hyperglycemia.  She has been taking levothyroxine  25 mcg  daily.  No hypo and hyperthyroid symptoms.  She denies numbness and tingling of the feet.  No vision problem.  No other complaints today.  REVIEW OF SYSTEMS As per history of present illness.   PAST MEDICAL HISTORY: Past Medical History:  Diagnosis Date   Diabetes mellitus without complication (HCC) 08/2015   diagnosed    PAST SURGICAL HISTORY: Past Surgical History:  Procedure Laterality Date   BUNIONECTOMY  2011   right foot   TUBAL LIGATION  1990    ALLERGIES: Allergies  Allergen Reactions   Lyumjev  [Insulin  Lispro] Itching    FAMILY HISTORY:  Family History  Problem  Relation Age of Onset   Heart disease Mother    Yvone' disease Mother    Thyroid  disease Sister    Thyroid  disease Maternal Grandmother    Diabetes Neg Hx    Hypertension Father    Crohn's disease Father    Rheum arthritis Father    Thyroid  disease Sister     SOCIAL HISTORY: Social History   Socioeconomic History   Marital status: Married    Spouse name: Not on file   Number of children: Not on file   Years of education: Not on file   Highest education level: Not on file  Occupational History   Occupation: rehab tech  Tobacco Use   Smoking status: Every Day    Current packs/day: 1.00    Average packs/day: 1 pack/day for 41.0 years (41.0 ttl pk-yrs)    Types: Cigarettes   Smokeless tobacco: Never  Substance and Sexual Activity   Alcohol use: No    Alcohol/week: 0.0 standard drinks of alcohol   Drug use: No   Sexual activity: Not on file  Other Topics Concern   Not on file  Social History Narrative   Not on file   Social Drivers of Health   Tobacco Use: High Risk (06/20/2024)   Patient History    Smoking Tobacco Use: Every Day    Smokeless Tobacco Use: Never    Passive Exposure: Not on file  Financial Resource Strain: Not on file  Food Insecurity: Not on file  Transportation Needs: Not on file  Physical Activity: Not on file  Stress: Not on file  Social Connections: Not on file  Depression (EYV7-0): Not on file  Alcohol Screen: Not on file  Housing: Not on file  Utilities: Not on file  Health Literacy: Not on file    MEDICATIONS:  Current Outpatient Medications  Medication Sig Dispense Refill   Alpha Lipoic Acid 200 MG CAPS Take by mouth daily.      Blood Glucose Monitoring Suppl (ACCU-CHEK GUIDE ME) w/Device KIT 1 each by Does not apply route in the morning and at bedtime. Use as instructed to check blood sugar twice daily. DX:E10.65. Replaces Accu Chek link meter. 1 kit 0   Cholecalciferol (VITAMIN D-3 PO) Take by mouth daily.      Chromium Picolinate  1000 MCG TABS Take by mouth.     Cinnamon 500 MG capsule Take 500 mg by mouth daily.     glucose blood (ACCU-CHEK GUIDE) test strip Use as instructed to test blood sugar 2 times daily E10.65 100 each 12   HUMALOG  100 UNIT/ML injection INJECT UP TO 50 UNITS DAILY VIA INSULIN  PUMP 60 mL 2   Insulin  Pen Needle 32G X 5 MM MISC Use 4 per day to inject Insulin  150 each 5   Lancets (ACCU-CHEK SOFT TOUCH) lancets Use as instructed to test blood sugar 2  times daily E10.65 100 each 12   levothyroxine  (SYNTHROID ) 25 MCG tablet Take 1 tablet (25 mcg total) by mouth daily before breakfast. 90 tablet 3   Misc Natural Products (LUTEIN 20 PO) Take by mouth.     Multiple Vitamin (MULTIVITAMIN WITH MINERALS) TABS tablet Take 1 tablet by mouth daily.     rosuvastatin  (CRESTOR ) 10 MG tablet Take 1 tablet (10 mg total) by mouth daily. 90 tablet 4   vitamin B-12 (CYANOCOBALAMIN) 1000 MCG tablet Take 1,000 mcg by mouth daily.     No current facility-administered medications for this visit.    PHYSICAL EXAM: Vitals:   06/20/24 1549  BP: 102/78  Pulse: 89  Resp: 16  SpO2: 96%  Weight: 125 lb 12.8 oz (57.1 kg)  Height: 5' 2 (1.575 m)    Body mass index is 23.01 kg/m.  Wt Readings from Last 3 Encounters:  06/20/24 125 lb 12.8 oz (57.1 kg)  02/15/24 128 lb (58.1 kg)  10/14/23 131 lb (59.4 kg)    General: Well developed, well nourished female in no apparent distress.  HEENT: AT/[place], no external lesions.  Eyes: Conjunctiva clear and no icterus. Neck: Neck supple  Lungs: Respirations not labored Neurologic: Alert, oriented, normal speech Extremities / Skin: Dry.  Psychiatric: Does not appear depressed or anxious  Diabetic Foot Exam - Simple   Simple Foot Form Diabetic Foot exam was performed with the following findings: Yes 06/20/2024  4:10 PM  Visual Inspection No deformities, no ulcerations, no other skin breakdown bilaterally: Yes Sensation Testing Intact to touch and monofilament testing  bilaterally: Yes Pulse Check Posterior Tibialis and Dorsalis pulse intact bilaterally: Yes Comments    LABS Reviewed Lab Results  Component Value Date   HGBA1C 8.5 (A) 06/20/2024   HGBA1C 8.5 (A) 02/15/2024   HGBA1C 8.5 (H) 10/05/2023   Lab Results  Component Value Date   FRUCTOSAMINE 306 (H) 07/01/2023   FRUCTOSAMINE 305 (H) 05/12/2022   FRUCTOSAMINE 334 (H) 07/14/2019   Lab Results  Component Value Date   CHOL 180 10/05/2023   HDL 87 10/05/2023   LDLCALC 79 10/05/2023   LDLDIRECT 95.0 12/30/2021   TRIG 55 10/05/2023   CHOLHDL 2.1 10/05/2023   Lab Results  Component Value Date   MICRALBCREAT 4 10/05/2023   MICRALBCREAT 4 10/04/2015   Lab Results  Component Value Date   CREATININE 0.67 10/05/2023   Lab Results  Component Value Date   GFR 90.57 09/23/2022    ASSESSMENT / PLAN  1. Uncontrolled type 1 diabetes mellitus with hyperglycemia (HCC)   2. Acquired autoimmune hypothyroidism   3. Hypercholesterolemia      Diabetes Mellitus type 1, complicated by no known complications. - Diabetic status / severity: Uncontrolled, GMI on CGM 7.4%.  Lab Results  Component Value Date   HGBA1C 8.5 (A) 06/20/2024    - Hemoglobin A1c goal <6.5%   GMI on CGM 7.4%.  She has discrepancy on hemoglobin A1c and GMI CGM.  With the sensor uses of 92% GMI is close to accurate.  Still mildly low uncontrolled type 1 diabetes mellitus.  Discussed about meal bolusing for all the meals preferably 10 to 15 minutes before eating.  Adjusted correction factor as follows.  - Medications:  Insulin  pump setting changed as follows:  Continue Medtronic insulin  pump therapy using 780G, using Humalog  U100  Insulin  Pump setting:  Basal MN- 0.500 u/hour 6AM- 0.600  8AM- 0.600 10PM- 0.500  Bolus CHO Ratio (1unit:CHO) MN- 1:10 12AM- 1:10 6PM- 1:10 8PM-  1:10  Correction/Sensitivity: MN- 1:45, changed to 1:40  Target: 120  Active insulin  time: 2 hours   - Home glucose  testing: continue CGM and check blood glucose as needed.  Discussed about changing to Instinct new sensor of Medtronic.  - Discussed/ Gave Hypoglycemia treatment plan.  # Consult : not required at this time.   # Annual urine for microalbuminuria/ creatinine ratio, no microalbuminuria currently. Last  Lab Results  Component Value Date   MICRALBCREAT 4 10/05/2023    # Foot check nightly / neuropathy.  # Annual dilated diabetic eye exams.  Advised for diabetic eye exam.  Referred to ophthalmology for diabetic eye exam.  - Diet: Make healthy diabetic food choices - Life style / activity / exercise: Discussed.  2. Blood pressure  -  BP Readings from Last 1 Encounters:  06/20/24 102/78    - Control is in target.  - No change in current plans.  3. Lipid status / Hyperlipidemia - Last  Lab Results  Component Value Date   LDLCALC 79 10/05/2023   - Continue rosuvastatin  10 mg daily.  # Primary hypothyroidism -Continue levothyroxine  25 mcg daily.  Diagnoses and all orders for this visit:  Uncontrolled type 1 diabetes mellitus with hyperglycemia (HCC) -     POCT glycosylated hemoglobin (Hb A1C) -     Basic metabolic panel with GFR -     Hemoglobin A1c -     Microalbumin / creatinine urine ratio -     Lipid panel  Acquired autoimmune hypothyroidism -     T4, free -     TSH  Hypercholesterolemia -     Lipid panel     DISPOSITION Follow up in clinic in 4 months suggested.  Labs prior to follow-up visit as ordered.   All questions answered and patient verbalized understanding of the plan.  Adilson Grafton, MD [facility] [address] [place], [address] [place], [place] [address] Phone # [phone]  At least part of this note was generated using voice recognition software. Inadvertent word errors may have occurred, which were not recognized during the proofreading process. "

## 2024-07-26 ENCOUNTER — Telehealth: Payer: Self-pay | Admitting: Endocrinology

## 2024-07-26 NOTE — Telephone Encounter (Signed)
 Patient called to say that she has changed insurances to [facility] plan.  Patient states that she was told by [facility] that her diagnosis needs to be verified with [facility] at [phone].

## 2024-10-14 ENCOUNTER — Other Ambulatory Visit

## 2024-10-18 ENCOUNTER — Ambulatory Visit: Admitting: Endocrinology
